# Patient Record
Sex: Female | Born: 1955 | Race: Black or African American | Hispanic: No | Marital: Married | Smoking: Former smoker
Health system: Southern US, Community
[De-identification: ages and names within clinical notes are randomized; demographics above are authoritative.]

## PROBLEM LIST (undated history)

## (undated) DIAGNOSIS — N179 Acute kidney failure, unspecified: Secondary | ICD-10-CM

## (undated) DIAGNOSIS — L309 Dermatitis, unspecified: Secondary | ICD-10-CM

## (undated) DIAGNOSIS — M5134 Other intervertebral disc degeneration, thoracic region: Secondary | ICD-10-CM

## (undated) DIAGNOSIS — K219 Gastro-esophageal reflux disease without esophagitis: Secondary | ICD-10-CM

## (undated) DIAGNOSIS — Z720 Tobacco use: Secondary | ICD-10-CM

## (undated) DIAGNOSIS — M109 Gout, unspecified: Secondary | ICD-10-CM

## (undated) DIAGNOSIS — E876 Hypokalemia: Secondary | ICD-10-CM

## (undated) DIAGNOSIS — E785 Hyperlipidemia, unspecified: Secondary | ICD-10-CM

## (undated) DIAGNOSIS — I10 Essential (primary) hypertension: Secondary | ICD-10-CM

## (undated) DIAGNOSIS — M8580 Other specified disorders of bone density and structure, unspecified site: Secondary | ICD-10-CM

## (undated) HISTORY — PX: COLONOSCOPY: SHX174

## (undated) HISTORY — PX: TUBAL LIGATION: SHX77

## (undated) HISTORY — DX: Hyperlipidemia, unspecified: E78.5

## (undated) HISTORY — DX: Gastro-esophageal reflux disease without esophagitis: K21.9

## (undated) HISTORY — DX: Tobacco use: Z72.0

## (undated) HISTORY — DX: Other intervertebral disc degeneration, thoracic region: M51.34

## (undated) HISTORY — DX: Gout, unspecified: M10.9

## (undated) HISTORY — DX: Other specified disorders of bone density and structure, unspecified site: M85.80

## (undated) HISTORY — PX: FOOT SURGERY: SHX648

## (undated) HISTORY — PX: POLYPECTOMY: SHX149

---
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
## 1999-06-13 ENCOUNTER — Encounter: Payer: Self-pay | Admitting: Emergency Medicine

## 1999-06-13 ENCOUNTER — Emergency Department (HOSPITAL_COMMUNITY): Admission: EM | Admit: 1999-06-13 | Discharge: 1999-06-13 | Payer: Self-pay

## 2000-02-03 ENCOUNTER — Ambulatory Visit (HOSPITAL_COMMUNITY): Admission: RE | Admit: 2000-02-03 | Discharge: 2000-02-03 | Payer: Self-pay | Admitting: *Deleted

## 2002-02-14 ENCOUNTER — Other Ambulatory Visit: Admission: RE | Admit: 2002-02-14 | Discharge: 2002-02-14 | Payer: Self-pay | Admitting: Gynecology

## 2003-01-29 ENCOUNTER — Other Ambulatory Visit: Admission: RE | Admit: 2003-01-29 | Discharge: 2003-01-29 | Payer: Self-pay | Admitting: Obstetrics and Gynecology

## 2004-08-24 ENCOUNTER — Other Ambulatory Visit: Admission: RE | Admit: 2004-08-24 | Discharge: 2004-08-24 | Payer: Self-pay | Admitting: Obstetrics and Gynecology

## 2004-09-16 ENCOUNTER — Encounter: Admission: RE | Admit: 2004-09-16 | Discharge: 2004-09-16 | Payer: Self-pay | Admitting: Obstetrics and Gynecology

## 2004-10-14 ENCOUNTER — Encounter (INDEPENDENT_AMBULATORY_CARE_PROVIDER_SITE_OTHER): Payer: Self-pay | Admitting: Specialist

## 2004-10-14 ENCOUNTER — Ambulatory Visit (HOSPITAL_COMMUNITY): Admission: RE | Admit: 2004-10-14 | Discharge: 2004-10-14 | Payer: Self-pay | Admitting: Obstetrics and Gynecology

## 2010-03-13 ENCOUNTER — Emergency Department (HOSPITAL_COMMUNITY): Admission: EM | Admit: 2010-03-13 | Discharge: 2010-03-13 | Payer: Self-pay | Admitting: Family Medicine

## 2010-04-17 ENCOUNTER — Encounter: Admission: RE | Admit: 2010-04-17 | Discharge: 2010-04-17 | Payer: Self-pay | Admitting: Internal Medicine

## 2010-04-21 ENCOUNTER — Ambulatory Visit (HOSPITAL_COMMUNITY): Admission: RE | Admit: 2010-04-21 | Discharge: 2010-04-21 | Payer: Self-pay | Admitting: Internal Medicine

## 2010-11-04 ENCOUNTER — Emergency Department (HOSPITAL_COMMUNITY)
Admission: EM | Admit: 2010-11-04 | Discharge: 2010-11-04 | Payer: Self-pay | Source: Home / Self Care | Admitting: Emergency Medicine

## 2011-01-11 LAB — POCT I-STAT, CHEM 8
BUN: 22 mg/dL (ref 6–23)
Calcium, Ion: 1.16 mmol/L (ref 1.12–1.32)
Chloride: 108 mEq/L (ref 96–112)
Creatinine, Ser: 1.4 mg/dL — ABNORMAL HIGH (ref 0.4–1.2)
Glucose, Bld: 96 mg/dL (ref 70–99)
HCT: 44 % (ref 36.0–46.0)
Hemoglobin: 15 g/dL (ref 12.0–15.0)
Potassium: 3.7 mEq/L (ref 3.5–5.1)
Sodium: 142 mEq/L (ref 135–145)
TCO2: 25 mmol/L (ref 0–100)

## 2011-03-12 NOTE — H&P (Signed)
Becky Lara, Becky Lara              ACCOUNT NO.:  [id number]   MEDICAL RECORD NO.:  [id number]          PATIENT TYPE:  AMB   LOCATION:  SDC                           FACILITY:  [facility]   PHYSICIAN:  Charles A. Delcambre, MDDATE OF BIRTH:  [birth year]   DATE OF ADMISSION:  DATE OF DISCHARGE:                                HISTORY & PHYSICAL   HISTORY OF PRESENT ILLNESS:  This patient is to be admitted October 14, 2004 for hysteroscopy and D&C for a large endometrial polyp with  menorrhagia.  She is a 55 year old para 2-0-2-2, LMP September 15, 2004.  She  underwent sonohistogram showing a large lower uterine segment, almost down  to the cervix, endometrial/endocervical polyp.  This measured 4 x 4 x 1.7  mm, solid, with cystic areas suggestive of a polyp.   PAST MEDICAL HISTORY:  Hypertension.   PAST SURGICAL HISTORY:  1.  SVD x2.  2.  Tubal ligation.   MEDICATIONS:  Antihypertensive medication.  She is not sure what, but she  takes it once a day in the morning.   PRIMARY CARE PHYSICIAN:  Georgann Housekeeper, M.D.   ALLERGIES:  NO KNOWN DRUG ALLERGIES.   SOCIAL HISTORY:  No tobacco, ethanol, or drug use.  STD exposure in the  past.  The patient not currently married, not sexually active currently.   FAMILY HISTORY:  Father deceased at age 78 with heart disease.  Mother  deceased at 40 with hypertension, diabetes.  No siblings.  Otherwise  negative family history.   REVIEW OF SYSTEMS:  Irregular menstrual cycles.  No fever or chills.  No  rashes, lesions.  Seasonal allergies.  No chest pain, shortness of breath,  or wheezing.  No diarrhea, constipation, bleeding, melena, hematochezia,  urgency, frequency, dysuria, incontinence, hematuria, galactorrhea,  emotional changes.   PHYSICAL EXAMINATION:  GENERAL:  Alert and oriented x3.  No acute distress.  VITAL SIGNS:  Blood pressure 128/70, respirations 20, heart rate 80, weight  224 pounds.  HEENT:  Grossly within normal limits.  NECK:   Supple without thyromegaly or adenopathy.  LUNGS:  Clear bilaterally.  BACK:  No CVAT.  CORONARY:  Regular rate and rhythm.  BREASTS:  Symmetrical.  Otherwise, deferred.  ABDOMEN:  Soft, nontender.  No hepatosplenomegaly or other masses noted.  PELVIC:  Normal external female genitalia.  Bartholin, Skene's, and urethra  within normal limits.  No discharge or lesions.  Uterus not enlarged  grossly.  Adnexa nontender and without masses bilaterally.  Ovaries  difficult to palpate secondary to body habitus.  Endometrial biopsy was  benign at that time.  Sound was to 10 cm.  EXTREMITIES:  Nontender.  No edema.  RECTAL:  Not done.  Anterior and superior __________ appeared normal.   ASSESSMENT:  Menorrhagia.  Endometrial polyp.   PLAN:  Hysteroscopy, D&C, with polypectomy.  Preoperative CBC, CHEM 20,  urine pregnancy test, and _________ 0800.  Plan surgery at 1400.  All  questions were answered.  Risks of infection, bleeding, uterine perforation,  blood product risks including hepatitis and HIV exposure; bowel and bladder  damage.  We  will proceed as outlined.      CAD/MEDQ  D:  10/09/2004  T:  10/09/2004  Job:  [id number]

## 2011-03-12 NOTE — Op Note (Signed)
NAMENANSI, BIRMINGHAM              ACCOUNT NO.:  [id number]   MEDICAL RECORD NO.:  [id number]          PATIENT TYPE:  AMB   LOCATION:  SDC                           FACILITY:  [facility]   PHYSICIAN:  Charles A. Delcambre, MDDATE OF BIRTH:  1956-07-12   DATE OF PROCEDURE:  10/14/2004  DATE OF DISCHARGE:                                 OPERATIVE REPORT   PREOPERATIVE DIAGNOSES:  1.  Endometrial polyp.  2.  Menorrhagia.   POSTOPERATIVE DIAGNOSES:  1.  Endometrial polyp.  2.  Menorrhagia.   PROCEDURES:  1.  Dilation and curettage.  2.  Polypectomy.   SURGEON:  Charles A. Delcambre, MD   ASSISTANT:  None.   COMPLICATIONS:  None.   ESTIMATED BLOOD LOSS:  Less than 50 mL.   OPERATIVE FINDINGS:  A very large lower uterine segment polyp, approximately  2 x 4 cm, soft and fleshy.   SPECIMENS:  1.  Polyp.  2.  Endometrial curettings.   Sponge and needle count correct x2.   SORBITOL LOSS:  Less than 50 mL.  See OR record for specifics.   ANESTHESIA:  General laryngeal and IV, mask.   DESCRIPTION OF PROCEDURE:  The patient was taken to the operating room and  placed in the supine position.  General anesthetic was induced without  difficulty.  The patient was then placed in the dorsal lithotomy position in  the universal stirrups.  Sterile prep and drape was undertaken.  A single-  tooth tenaculum was placed on the anterior lip of the cervix.  Hegar  dilators were used to dilate, the sound was to 10 cm.  Dilation was done  enough to pass polyp forceps.  The large polyp structure was grasped with  the ring forceps in the lower uterine segment, twisted  free.  Hemostasis was adequate.  The polyp bed was curetted with banjo  curette, the uterus cavity was curetted with the banjo curette as well.  A  moderate amount of tissue was obtained.  Hemostasis was adequate.  All  instruments were removed and the patient was taken to recovery with the  physician in attendance.      CAD/MEDQ   D:  10/14/2004  T:  10/14/2004  Job:  [id number]

## 2011-05-11 ENCOUNTER — Other Ambulatory Visit: Payer: Self-pay | Admitting: Internal Medicine

## 2011-05-11 DIAGNOSIS — Z1231 Encounter for screening mammogram for malignant neoplasm of breast: Secondary | ICD-10-CM

## 2011-05-17 ENCOUNTER — Ambulatory Visit
Admission: RE | Admit: 2011-05-17 | Discharge: 2011-05-17 | Disposition: A | Discharge status: Home / Self Care | Payer: BC Managed Care – PPO | Source: Ambulatory Visit | Attending: Internal Medicine | Admitting: Internal Medicine

## 2011-05-17 DIAGNOSIS — Z1231 Encounter for screening mammogram for malignant neoplasm of breast: Secondary | ICD-10-CM

## 2012-01-20 ENCOUNTER — Other Ambulatory Visit: Payer: Self-pay | Admitting: Obstetrics

## 2012-01-20 DIAGNOSIS — Z780 Asymptomatic menopausal state: Secondary | ICD-10-CM

## 2012-04-24 ENCOUNTER — Ambulatory Visit (HOSPITAL_COMMUNITY): Payer: BC Managed Care – PPO

## 2012-04-26 ENCOUNTER — Ambulatory Visit (HOSPITAL_COMMUNITY)
Admission: RE | Admit: 2012-04-26 | Discharge: 2012-04-26 | Disposition: A | Discharge status: Home / Self Care | Payer: BC Managed Care – PPO | Source: Ambulatory Visit | Attending: Obstetrics | Admitting: Obstetrics

## 2012-04-26 DIAGNOSIS — Z780 Asymptomatic menopausal state: Secondary | ICD-10-CM | POA: Insufficient documentation

## 2012-04-26 DIAGNOSIS — Z13820 Encounter for screening for osteoporosis: Secondary | ICD-10-CM | POA: Insufficient documentation

## 2012-05-17 ENCOUNTER — Emergency Department (HOSPITAL_COMMUNITY)
Admission: EM | Admit: 2012-05-17 | Discharge: 2012-05-17 | Disposition: A | Discharge status: Home / Self Care | Payer: BC Managed Care – PPO | Source: Home / Self Care | Attending: Emergency Medicine | Admitting: Emergency Medicine

## 2012-05-17 ENCOUNTER — Encounter (HOSPITAL_COMMUNITY): Payer: Self-pay | Admitting: Emergency Medicine

## 2012-05-17 DIAGNOSIS — H6120 Impacted cerumen, unspecified ear: Secondary | ICD-10-CM

## 2012-05-17 HISTORY — DX: Essential (primary) hypertension: I10

## 2012-05-17 MED ORDER — NEOMYCIN-POLYMYXIN-HC 3.5-10000-1 OT SUSP: 4.0000 [drp] | Freq: Three times a day (TID) | OTIC | Status: AC

## 2012-05-17 NOTE — ED Notes (Signed)
HERE WITH R EAR PAIN THAT STARTED X4 DYS AGO.SORE TO TOUCH AND IRRITATING.TRIED HOME BABY OIL DROPS??DENIES DRAINAGE OR DIFF HEARING

## 2012-05-17 NOTE — ED Provider Notes (Signed)
History     CSN: [id number]  Arrival date & time 05/17/12  1152   First MD Initiated Contact with Patient 05/17/12 1240      Chief Complaint  Patient presents with  . Otalgia    (Consider location/radiation/quality/duration/timing/severity/associated sxs/prior treatment) HPI Comments: For 4 days she has been complaining that her right ear hurts more so at touch. She is been putting some baby all drops in her ear canal with no improvement. She wears earplugs frequently at her job, she denies any fevers, congestion or ear drainage.  The history is provided by the patient.    Past Medical History  Diagnosis Date  . Hypertension     History reviewed. No pertinent past surgical history.  No family history on file.  History  Substance Use Topics  . Smoking status: Current Everyday Smoker -- 1.0 packs/day    Types: Cigarettes  . Smokeless tobacco: Not on file  . Alcohol Use: Yes    OB History    Grav Para Term Preterm Abortions TAB SAB Ect Mult Living                  Review of Systems  Constitutional: Negative for activity change.  HENT: Positive for ear pain and ear discharge. Negative for hearing loss, nosebleeds, congestion, facial swelling, rhinorrhea, neck pain, neck stiffness, postnasal drip and tinnitus.   Eyes: Negative for photophobia, pain, redness, itching and visual disturbance.    Allergies  Review of patient's allergies indicates no known allergies.  Home Medications   Current Outpatient Rx  Name Route Sig Dispense Refill  . NEOMYCIN-POLYMYXIN-HC 3.5-10000-1 OT SUSP Right Ear Place 4 drops into the right ear 3 (three) times daily. 10 mL 0    BP 123/78  Pulse 80  Temp 98.3 F (36.8 C) (Oral)  Resp 16  SpO2 95%  LMP 04/26/2008  Physical Exam  Nursing note and vitals reviewed. Constitutional: She appears well-developed and well-nourished.  HENT:  Right Ear: Tympanic membrane normal. No lacerations. There is tenderness. No drainage or  swelling. No foreign bodies. No mastoid tenderness. Tympanic membrane is not injected, not retracted and not bulging. Tympanic membrane mobility is normal. No middle ear effusion. No decreased hearing is noted.  Eyes: Conjunctivae are normal.  Neck: Normal range of motion. Neck supple. No JVD present.  Musculoskeletal: She exhibits no edema and no tenderness.  Lymphadenopathy:    She has no cervical adenopathy.  Neurological: She is alert.  Skin: No rash noted. No erythema.    ED Course  Procedures (including critical care time)  Labs Reviewed - No data to display No results found.   1. Cerumen impaction       MDM  Right ear cerumen impaction. With an associated early ear canal infection. Patient was prescribed a topical ear antibiotic drops for 5 days. Was advised to return if no improvement after 4 days after ear irrigation was able to reexamine patient and observe and unremarkable tympanic membrane.        Jimmie Molly, MD 05/17/12 [phone]

## 2012-05-22 ENCOUNTER — Other Ambulatory Visit: Payer: Self-pay | Admitting: Internal Medicine

## 2012-05-22 DIAGNOSIS — Z1231 Encounter for screening mammogram for malignant neoplasm of breast: Secondary | ICD-10-CM

## 2012-06-02 ENCOUNTER — Ambulatory Visit
Admission: RE | Admit: 2012-06-02 | Discharge: 2012-06-02 | Disposition: A | Discharge status: Home / Self Care | Payer: BC Managed Care – PPO | Source: Ambulatory Visit | Attending: Internal Medicine | Admitting: Internal Medicine

## 2012-06-02 DIAGNOSIS — Z1231 Encounter for screening mammogram for malignant neoplasm of breast: Secondary | ICD-10-CM

## 2013-08-10 ENCOUNTER — Other Ambulatory Visit: Payer: Self-pay

## 2013-08-10 DIAGNOSIS — Z1231 Encounter for screening mammogram for malignant neoplasm of breast: Secondary | ICD-10-CM

## 2013-09-10 ENCOUNTER — Ambulatory Visit: Payer: BC Managed Care – PPO

## 2013-09-10 ENCOUNTER — Ambulatory Visit
Admission: RE | Admit: 2013-09-10 | Discharge: 2013-09-10 | Disposition: A | Discharge status: Home / Self Care | Payer: BC Managed Care – PPO | Source: Ambulatory Visit

## 2013-09-10 DIAGNOSIS — Z1231 Encounter for screening mammogram for malignant neoplasm of breast: Secondary | ICD-10-CM

## 2014-10-31 ENCOUNTER — Other Ambulatory Visit: Payer: Self-pay

## 2014-10-31 DIAGNOSIS — Z1231 Encounter for screening mammogram for malignant neoplasm of breast: Secondary | ICD-10-CM

## 2014-11-08 ENCOUNTER — Ambulatory Visit
Admission: RE | Admit: 2014-11-08 | Discharge: 2014-11-08 | Disposition: A | Discharge status: Home / Self Care | Payer: BLUE CROSS/BLUE SHIELD | Source: Ambulatory Visit

## 2014-11-08 DIAGNOSIS — Z1231 Encounter for screening mammogram for malignant neoplasm of breast: Secondary | ICD-10-CM

## 2014-12-18 ENCOUNTER — Encounter (HOSPITAL_COMMUNITY): Payer: Self-pay | Admitting: Emergency Medicine

## 2014-12-18 ENCOUNTER — Observation Stay (HOSPITAL_COMMUNITY)
Admission: EM | Admit: 2014-12-18 | Discharge: 2014-12-20 | Disposition: A | Discharge status: Home / Self Care | Payer: BLUE CROSS/BLUE SHIELD | Attending: Internal Medicine | Admitting: Internal Medicine

## 2014-12-18 DIAGNOSIS — N179 Acute kidney failure, unspecified: Secondary | ICD-10-CM

## 2014-12-18 DIAGNOSIS — R079 Chest pain, unspecified: Secondary | ICD-10-CM | POA: Diagnosis not present

## 2014-12-18 DIAGNOSIS — I10 Essential (primary) hypertension: Secondary | ICD-10-CM | POA: Diagnosis not present

## 2014-12-18 DIAGNOSIS — F17210 Nicotine dependence, cigarettes, uncomplicated: Secondary | ICD-10-CM | POA: Diagnosis not present

## 2014-12-18 DIAGNOSIS — E876 Hypokalemia: Secondary | ICD-10-CM | POA: Insufficient documentation

## 2014-12-18 DIAGNOSIS — Z7982 Long term (current) use of aspirin: Secondary | ICD-10-CM | POA: Insufficient documentation

## 2014-12-18 DIAGNOSIS — Z720 Tobacco use: Secondary | ICD-10-CM | POA: Diagnosis present

## 2014-12-18 DIAGNOSIS — K219 Gastro-esophageal reflux disease without esophagitis: Secondary | ICD-10-CM | POA: Insufficient documentation

## 2014-12-18 DIAGNOSIS — D72829 Elevated white blood cell count, unspecified: Secondary | ICD-10-CM | POA: Insufficient documentation

## 2014-12-18 DIAGNOSIS — H538 Other visual disturbances: Secondary | ICD-10-CM | POA: Diagnosis present

## 2014-12-18 LAB — CBC WITH DIFFERENTIAL/PLATELET
BASOS ABS: 0 10*3/uL (ref 0.0–0.1)
BASOS PCT: 0 % (ref 0–1)
Eosinophils Absolute: 0.3 10*3/uL (ref 0.0–0.7)
Eosinophils Relative: 3 % (ref 0–5)
HCT: 42.4 % (ref 36.0–46.0)
Hemoglobin: 14.5 g/dL (ref 12.0–15.0)
LYMPHS PCT: 17 % (ref 12–46)
Lymphs Abs: 2.2 10*3/uL (ref 0.7–4.0)
MCH: 28.7 pg (ref 26.0–34.0)
MCHC: 34.2 g/dL (ref 30.0–36.0)
MCV: 84 fL (ref 78.0–100.0)
Monocytes Absolute: 0.9 10*3/uL (ref 0.1–1.0)
Monocytes Relative: 7 % (ref 3–12)
NEUTROS ABS: 9.3 10*3/uL — AB (ref 1.7–7.7)
NEUTROS PCT: 73 % (ref 43–77)
Platelets: 192 10*3/uL (ref 150–400)
RBC: 5.05 MIL/uL (ref 3.87–5.11)
RDW: 13.5 % (ref 11.5–15.5)
WBC: 12.7 10*3/uL — AB (ref 4.0–10.5)

## 2014-12-18 LAB — URINALYSIS, ROUTINE W REFLEX MICROSCOPIC
BILIRUBIN URINE: NEGATIVE
Glucose, UA: NEGATIVE mg/dL
HGB URINE DIPSTICK: NEGATIVE
Ketones, ur: NEGATIVE mg/dL
Nitrite: NEGATIVE
Protein, ur: NEGATIVE mg/dL
Specific Gravity, Urine: 1.013 (ref 1.005–1.030)
Urobilinogen, UA: 0.2 mg/dL (ref 0.0–1.0)
pH: 5.5 (ref 5.0–8.0)

## 2014-12-18 LAB — URINE MICROSCOPIC-ADD ON

## 2014-12-18 LAB — PROTIME-INR
INR: 0.96 (ref 0.00–1.49)
PROTHROMBIN TIME: 12.9 s (ref 11.6–15.2)

## 2014-12-18 MED ORDER — HYDROCODONE-ACETAMINOPHEN 5-325 MG PO TABS: 1.0000 | ORAL_TABLET | Freq: Once | ORAL | Status: DC

## 2014-12-18 MED ORDER — SODIUM CHLORIDE 0.9 % IV BOLUS (SEPSIS)
1000.0000 mL | Freq: Once | INTRAVENOUS | Status: AC
  Administered 2014-12-19: 1000 mL via INTRAVENOUS

## 2014-12-18 NOTE — ED Provider Notes (Signed)
CSN: [id number]     Arrival date & time 12/18/14  2202 History   First MD Initiated Contact with Patient 12/18/14 2204     Chief Complaint  Patient presents with  . Chest Pain     (Consider location/radiation/quality/duration/timing/severity/associated sxs/prior Treatment) The history is provided by the patient. No language interpreter was used.  Becky Lara is a 59 y/o F with PMHx of HTN presenting to the ED with sudden onset of chest pain that started this evening at approximately 9:00PM while the patient was at work. Patient reported that she had sudden onset of left sided chest pain, underneath her left breast that was a sharp pain without radiation. Reported that she was sweaty and clammy during this time and felt as if she was about to faint. Stated that she chewed 4 baby aspirins. Stated that EMS was called. Reported that prior to EMS coming she had another episode of chest pain that was sharp, was given one SL nitro while on route to the ED - stated that this has relieved her pain. Reported that since the event she started to have a mild headache. Reported cloudy vision, as if there is a hazy film over her eyes bilaterally. Denied shortness of breath, difficulty breathing, fainting, nausea, vomiting, diarrhea, is complete loss of vision, numbness, tingling, weakness, difficulty with speech, difficulty swallowing, neck pain, neck stiffness, jaw pain. PCP [facility]  Past Medical History  Diagnosis Date  . Hypertension    History reviewed. No pertinent past surgical history. No family history on file. History  Substance Use Topics  . Smoking status: Current Some Day Smoker -- 0.05 packs/day    Types: Cigarettes  . Smokeless tobacco: Not on file  . Alcohol Use: Yes     Comment: occasional   OB History    No data available     Review of Systems  Constitutional: Positive for diaphoresis. Negative for fever and chills.  Eyes: Positive for visual disturbance.   Respiratory: Negative for cough, chest tightness and shortness of breath.   Cardiovascular: Positive for chest pain.  Gastrointestinal: Negative for nausea and vomiting.  Musculoskeletal: Negative for back pain and neck pain.  Neurological: Negative for dizziness, weakness, numbness and headaches.      Allergies  Review of patient's allergies indicates no known allergies.  Home Medications   Prior to Admission medications   Not on File   BP 133/74 mmHg  Pulse 85  Temp(Src) 98.8 F (37.1 C) (Oral)  Resp 19  SpO2 99%  LMP 04/26/2008 Physical Exam  Constitutional: She is oriented to person, place, and time. She appears well-developed and well-nourished. No distress.  HENT:  Head: Normocephalic and atraumatic.  Mouth/Throat: Oropharynx is clear and moist. No oropharyngeal exudate.  Eyes: Conjunctivae and EOM are normal. Pupils are equal, round, and reactive to light. Right eye exhibits no discharge. Left eye exhibits no discharge.  Neck: Normal range of motion. Neck supple. No tracheal deviation present.  Cardiovascular: Normal rate, regular rhythm and normal heart sounds.   Pulses:      Radial pulses are 2+ on the right side, and 2+ on the left side.       Dorsalis pedis pulses are 2+ on the right side, and 2+ on the left side.  Pulmonary/Chest: Effort normal and breath sounds normal. No respiratory distress. She has no wheezes. She has no rales. She exhibits no tenderness.  Patient is able to speak in full sentences without difficulty  Negative use of  accessory muscles Negative stridor  Musculoskeletal: Normal range of motion.  Full ROM to upper and lower extremities without difficulty noted, negative ataxia noted.  Lymphadenopathy:    She has no cervical adenopathy.  Neurological: She is alert and oriented to person, place, and time. No cranial nerve deficit. She exhibits normal muscle tone. Coordination normal. GCS eye subscore is 4. GCS verbal subscore is 5. GCS motor  subscore is 6.  Cranial nerves grossly intact Strength 5+/5+ to upper and lower extremities bilaterally with resistance applied, equal distribution noted Equal grip strength  Sensation intact  Negative facial droop Negative slurred speech  Negative aphasia Negative arm drift Fine motor skills intact Patient follows commands well  Patient responds to questions appropriately   Skin: Skin is warm and dry. No rash noted. She is not diaphoretic. No erythema.  Psychiatric: She has a normal mood and affect. Her behavior is normal. Thought content normal.  Nursing note and vitals reviewed.   ED Course  Procedures (including critical care time)  Results for orders placed or performed during the hospital encounter of 12/18/14  Troponin I  Result Value Ref Range   Troponin I <0.03 <0.031 ng/mL  CBC with Differential/Platelet  Result Value Ref Range   WBC 12.7 (H) 4.0 - 10.5 K/uL   RBC 5.05 3.87 - 5.11 MIL/uL   Hemoglobin 14.5 12.0 - 15.0 g/dL   HCT 42.4 36.0 - 46.0 %   MCV 84.0 78.0 - 100.0 fL   MCH 28.7 26.0 - 34.0 pg   MCHC 34.2 30.0 - 36.0 g/dL   RDW 13.5 11.5 - 15.5 %   Platelets 192 150 - 400 K/uL   Neutrophils Relative % 73 43 - 77 %   Neutro Abs 9.3 (H) 1.7 - 7.7 K/uL   Lymphocytes Relative 17 12 - 46 %   Lymphs Abs 2.2 0.7 - 4.0 K/uL   Monocytes Relative 7 3 - 12 %   Monocytes Absolute 0.9 0.1 - 1.0 K/uL   Eosinophils Relative 3 0 - 5 %   Eosinophils Absolute 0.3 0.0 - 0.7 K/uL   Basophils Relative 0 0 - 1 %   Basophils Absolute 0.0 0.0 - 0.1 K/uL  Comprehensive metabolic panel  Result Value Ref Range   Sodium 138 135 - 145 mmol/L   Potassium 2.6 (LL) 3.5 - 5.1 mmol/L   Chloride 99 96 - 112 mmol/L   CO2 28 19 - 32 mmol/L   Glucose, Bld 156 (H) 70 - 99 mg/dL   BUN 13 6 - 23 mg/dL   Creatinine, Ser 1.54 (H) 0.50 - 1.10 mg/dL   Calcium 9.8 8.4 - 10.5 mg/dL   Total Protein 8.4 (H) 6.0 - 8.3 g/dL   Albumin 4.0 3.5 - 5.2 g/dL   AST 21 0 - 37 U/L   ALT 17 0 - 35 U/L    Alkaline Phosphatase 87 39 - 117 U/L   Total Bilirubin 1.1 0.3 - 1.2 mg/dL   GFR calc non Af Amer 36 (L) >90 mL/min   GFR calc Af Amer 41 (L) >90 mL/min   Anion gap 11 5 - 15  Urinalysis, Routine w reflex microscopic  Result Value Ref Range   Color, Urine YELLOW YELLOW   APPearance CLEAR CLEAR   Specific Gravity, Urine 1.013 1.005 - 1.030   pH 5.5 5.0 - 8.0   Glucose, UA NEGATIVE NEGATIVE mg/dL   Hgb urine dipstick NEGATIVE NEGATIVE   Bilirubin Urine NEGATIVE NEGATIVE   Ketones, ur NEGATIVE NEGATIVE  mg/dL   Protein, ur NEGATIVE NEGATIVE mg/dL   Urobilinogen, UA 0.2 0.0 - 1.0 mg/dL   Nitrite NEGATIVE NEGATIVE   Leukocytes, UA TRACE (A) NEGATIVE  Brain natriuretic peptide  Result Value Ref Range   B Natriuretic Peptide 217.7 (H) 0.0 - 100.0 pg/mL  Protime-INR  Result Value Ref Range   Prothrombin Time 12.9 11.6 - 15.2 seconds   INR 0.96 0.00 - 1.49  Ethanol  Result Value Ref Range   Alcohol, Ethyl (B) <5 0 - 9 mg/dL  Urine microscopic-add on  Result Value Ref Range   Squamous Epithelial / LPF RARE RARE   WBC, UA 3-6 <3 WBC/hpf   RBC / HPF 0-2 <3 RBC/hpf   Bacteria, UA RARE RARE   Casts HYALINE CASTS (A) NEGATIVE    Labs Review Labs Reviewed  CBC WITH DIFFERENTIAL/PLATELET - Abnormal; Notable for the following:    WBC 12.7 (*)    Neutro Abs 9.3 (*)    All other components within normal limits  COMPREHENSIVE METABOLIC PANEL - Abnormal; Notable for the following:    Potassium 2.6 (*)    Glucose, Bld 156 (*)    Creatinine, Ser 1.54 (*)    Total Protein 8.4 (*)    GFR calc non Af Amer 36 (*)    GFR calc Af Amer 41 (*)    All other components within normal limits  URINALYSIS, ROUTINE W REFLEX MICROSCOPIC - Abnormal; Notable for the following:    Leukocytes, UA TRACE (*)    All other components within normal limits  BRAIN NATRIURETIC PEPTIDE - Abnormal; Notable for the following:    B Natriuretic Peptide 217.7 (*)    All other components within normal limits  URINE  MICROSCOPIC-ADD ON - Abnormal; Notable for the following:    Casts HYALINE CASTS (*)    All other components within normal limits  TROPONIN I  PROTIME-INR  ETHANOL  URINE RAPID DRUG SCREEN (HOSP PERFORMED)  MAGNESIUM    Imaging Review Dg Chest 2 View  12/19/2014   CLINICAL DATA:  Sharp left-sided chest pain.  EXAM: CHEST  2 VIEW  COMPARISON:  04/21/2010  FINDINGS: Shallow inspiration with elevation of the right hemidiaphragm. Linear atelectasis in both lung bases, greater on the right. Normal heart size and pulmonary vascularity. No focal consolidation in the lungs. No blunting of costophrenic angles. No pneumothorax. Degenerative changes in the spine.  IMPRESSION: Shallow inspiration with elevation of right hemidiaphragm. Atelectasis in the lung bases, greater on the right.   Electronically Signed   By: Lucienne Capers M.D.   On: 12/19/2014 00:29   Ct Head Wo Contrast  12/19/2014   CLINICAL DATA:  Bilateral blurry vision and headaches for 1 month. Hypertension.  EXAM: CT HEAD WITHOUT CONTRAST  TECHNIQUE: Contiguous axial images were obtained from the base of the skull through the vertex without intravenous contrast.  COMPARISON:  None.  FINDINGS: Ventricles and sulci appear symmetrical. Low-attenuation changes throughout the deep white matter likely due to small vessel ischemia although nonspecific and other demyelinating or dysmyelinating processes could also have this appearance. No mass effect or midline shift. No abnormal extra-axial fluid collections. Gray-white matter junctions are distinct. Basal cisterns are not effaced. No evidence of acute intracranial hemorrhage. No depressed skull fractures. Visualized paranasal sinuses and mastoid air cells are not opacified.  IMPRESSION: Nonspecific diffuse white matter disease. No acute intracranial hemorrhage or mass effect.   Electronically Signed   By: Lucienne Capers M.D.   On: 12/19/2014 00:28  EKG Interpretation   Date/Time:  Wednesday  December 18 2014 22:15:45 EST Ventricular Rate:  93 PR Interval:  212 QRS Duration: 80 QT Interval:  366 QTC Calculation: 455 R Axis:   13 Text Interpretation:  Age not entered, assumed to be  59 years old for  purpose of ECG interpretation Sinus rhythm Prolonged PR interval  Borderline T wave abnormalities Baseline wander in lead(s) V3 Confirmed by  ZAMMIT  MD, JOSEPH [phone]) on 12/18/2014 10:18:54 PM         Visual Acuity  Right Eye Distance:   Left Eye Distance:   Bilateral Distance:    Right Eye Near: R Near: 20/200 Left Eye Near:  L Near: 20/200 Bilateral Near:  20/200  12:56 AM This provider spoke with Dr. Blaine Hamper, [facility] - discussed case, labs, imaging, vitals, ED course in great detail. Patient to be admitted to telemetry for chest pain and hypokalemia. Recommended potassium PO to be administered.   MDM   Final diagnoses:  Chest pain, unspecified chest pain type  Hypokalemia    Medications  sodium chloride 0.9 % bolus 1,000 mL (1,000 mLs Intravenous New Bag/Given 12/19/14 0022)  potassium chloride SA (K-DUR,KLOR-CON) CR tablet 40 mEq (40 mEq Oral Given 12/19/14 0059)    Filed Vitals:   12/18/14 2215 12/18/14 2315 12/18/14 2345 12/19/14 0045  BP: 121/74 134/74 130/68 133/74  Pulse: 95 88 82 85  Temp:      TempSrc:      Resp: 12  13 19   SpO2: 97% 100% 98% 99%   EKG noted sinus rhythm with prolonged PR interval with a heart rate of 93 bpm. Troponin negative elevation. CBC noted mildly elevated white blood cell count of 12.7. Hemoglobin 14.5, hematocrit 42.5. CMP noted hypokalemia with a potassium level of 2.6. Creatinine elevated at 1.54 - nothing to compare within the past 4 years. Magnesium 2.2. BNP elevated at 217.7. Ethanol negative elevation. Urinalysis noted-negative nitrites, trace of leukocytes-white blood cell count 3-6. Urine drug screen positive for cannabis. CT head without contrast nonspecific diffuse white matter disease, no acute intracranial  hemorrhage or mass effect identified. Chest x-ray noted shallow inspiration with elevation of right hemidiaphragm atelectasis in the lung bases, greater on the right.  Incidental finding of hypokalemia with potassium level of 2.6 - magnesium pending. Patient started on potassium by mouth. Reported haziness to eyes bilaterally - negative focal neurological deficits, visual acuity unremarkable and CT head negative. Patient presenting to the ED with sudden onset of chest pain localized left-sided chest. Patient to be admitted to the hospital for cardiac rule out. Discussed case in great detail [facility], patient to be admitted to telemetry floor regarding hypokalemia and chest pain. Discussed plan for admission with patient in great detail. Patient agreed to plan of admission. Patient stable for transfer.  Jamse Mead, PA-C 12/19/14 0101  Jamse Mead, PA-C 12/19/14 2902  Maudry Diego, MD 12/20/14 [phone]

## 2014-12-18 NOTE — ED Notes (Signed)
PA at the bedside.

## 2014-12-18 NOTE — ED Notes (Addendum)
Per EMS, pt coming from work today for left lower CP. Pt began having this CP at 2115 tonight. Pt said that she was at work and felt like she was going to pass out and became dizzy and diaphoretic. Pt alert x4. Pt reports blurred vision at this time. Pt has no focal weakness Pt given 324 of aspirin in route.

## 2014-12-19 ENCOUNTER — Emergency Department (HOSPITAL_COMMUNITY): Payer: BLUE CROSS/BLUE SHIELD

## 2014-12-19 ENCOUNTER — Encounter (HOSPITAL_COMMUNITY): Payer: Self-pay

## 2014-12-19 ENCOUNTER — Inpatient Hospital Stay (HOSPITAL_COMMUNITY): Payer: BLUE CROSS/BLUE SHIELD

## 2014-12-19 DIAGNOSIS — R079 Chest pain, unspecified: Principal | ICD-10-CM | POA: Insufficient documentation

## 2014-12-19 DIAGNOSIS — N179 Acute kidney failure, unspecified: Secondary | ICD-10-CM

## 2014-12-19 DIAGNOSIS — E876 Hypokalemia: Secondary | ICD-10-CM

## 2014-12-19 DIAGNOSIS — Z720 Tobacco use: Secondary | ICD-10-CM | POA: Diagnosis present

## 2014-12-19 DIAGNOSIS — I10 Essential (primary) hypertension: Secondary | ICD-10-CM

## 2014-12-19 DIAGNOSIS — H538 Other visual disturbances: Secondary | ICD-10-CM

## 2014-12-19 HISTORY — DX: Acute kidney failure, unspecified: N17.9

## 2014-12-19 HISTORY — DX: Hypokalemia: E87.6

## 2014-12-19 HISTORY — DX: Essential (primary) hypertension: I10

## 2014-12-19 LAB — COMPREHENSIVE METABOLIC PANEL
ALBUMIN: 4 g/dL (ref 3.5–5.2)
ALT: 17 U/L (ref 0–35)
ANION GAP: 11 (ref 5–15)
AST: 21 U/L (ref 0–37)
Alkaline Phosphatase: 87 U/L (ref 39–117)
BUN: 13 mg/dL (ref 6–23)
CALCIUM: 9.8 mg/dL (ref 8.4–10.5)
CHLORIDE: 99 mmol/L (ref 96–112)
CO2: 28 mmol/L (ref 19–32)
CREATININE: 1.54 mg/dL — AB (ref 0.50–1.10)
GFR calc Af Amer: 41 mL/min — ABNORMAL LOW (ref >90)
GFR, EST NON AFRICAN AMERICAN: 36 mL/min — AB (ref >90)
Glucose, Bld: 156 mg/dL — ABNORMAL HIGH (ref 70–99)
Potassium: 2.6 mmol/L — CL (ref 3.5–5.1)
Sodium: 138 mmol/L (ref 135–145)
Total Bilirubin: 1.1 mg/dL (ref 0.3–1.2)
Total Protein: 8.4 g/dL — ABNORMAL HIGH (ref 6.0–8.3)

## 2014-12-19 LAB — LIPID PANEL
Cholesterol: 169 mg/dL (ref 0–200)
HDL: 65 mg/dL (ref >39)
LDL Cholesterol: 95 mg/dL (ref 0–99)
Total CHOL/HDL Ratio: 2.6 RATIO
Triglycerides: 45 mg/dL (ref <150)
VLDL: 9 mg/dL (ref 0–40)

## 2014-12-19 LAB — CBC
HEMATOCRIT: 39.1 % (ref 36.0–46.0)
HEMOGLOBIN: 13.5 g/dL (ref 12.0–15.0)
MCH: 29 pg (ref 26.0–34.0)
MCHC: 34.5 g/dL (ref 30.0–36.0)
MCV: 84.1 fL (ref 78.0–100.0)
Platelets: 179 10*3/uL (ref 150–400)
RBC: 4.65 MIL/uL (ref 3.87–5.11)
RDW: 13.6 % (ref 11.5–15.5)
WBC: 10.4 10*3/uL (ref 4.0–10.5)

## 2014-12-19 LAB — RAPID URINE DRUG SCREEN, HOSP PERFORMED
Amphetamines: NOT DETECTED
BARBITURATES: NOT DETECTED
Benzodiazepines: NOT DETECTED
Cocaine: NOT DETECTED
OPIATES: NOT DETECTED
Tetrahydrocannabinol: POSITIVE — AB

## 2014-12-19 LAB — BASIC METABOLIC PANEL
Anion gap: 11 (ref 5–15)
BUN: 11 mg/dL (ref 6–23)
CO2: 25 mmol/L (ref 19–32)
Calcium: 9.1 mg/dL (ref 8.4–10.5)
Chloride: 105 mmol/L (ref 96–112)
Creatinine, Ser: 1.25 mg/dL — ABNORMAL HIGH (ref 0.50–1.10)
GFR calc Af Amer: 53 mL/min — ABNORMAL LOW (ref >90)
GFR, EST NON AFRICAN AMERICAN: 46 mL/min — AB (ref >90)
GLUCOSE: 105 mg/dL — AB (ref 70–99)
POTASSIUM: 3.1 mmol/L — AB (ref 3.5–5.1)
Sodium: 141 mmol/L (ref 135–145)

## 2014-12-19 LAB — TROPONIN I
Troponin I: <0.03 ng/mL (ref <0.031)
Troponin I: <0.03 ng/mL (ref <0.031)
Troponin I: <0.03 ng/mL (ref <0.031)
Troponin I: <0.03 ng/mL (ref <0.031)

## 2014-12-19 LAB — ETHANOL: Alcohol, Ethyl (B): <5 mg/dL (ref 0–9)

## 2014-12-19 LAB — SODIUM, URINE, RANDOM: Sodium, Ur: 87 mmol/L

## 2014-12-19 LAB — BRAIN NATRIURETIC PEPTIDE: B NATRIURETIC PEPTIDE 5: 217.7 pg/mL — AB (ref 0.0–100.0)

## 2014-12-19 LAB — MAGNESIUM: Magnesium: 2.2 mg/dL (ref 1.5–2.5)

## 2014-12-19 LAB — GLUCOSE, CAPILLARY
GLUCOSE-CAPILLARY: 102 mg/dL — AB (ref 70–99)
Glucose-Capillary: 113 mg/dL — ABNORMAL HIGH (ref 70–99)

## 2014-12-19 LAB — CREATININE, URINE, RANDOM: Creatinine, Urine: 86.6 mg/dL

## 2014-12-19 MED ORDER — MORPHINE SULFATE 2 MG/ML IJ SOLN: 2.0000 mg | INTRAMUSCULAR | Status: DC | PRN

## 2014-12-19 MED ORDER — SODIUM CHLORIDE 0.9 % IJ SOLN
3.0000 mL | Freq: Two times a day (BID) | INTRAMUSCULAR | Status: DC
  Administered 2014-12-20: 3 mL via INTRAVENOUS

## 2014-12-19 MED ORDER — SODIUM CHLORIDE 0.9 % IV SOLN
INTRAVENOUS | Status: DC
  Administered 2014-12-19 (×2): via INTRAVENOUS

## 2014-12-19 MED ORDER — POTASSIUM CHLORIDE CRYS ER 20 MEQ PO TBCR
40.0000 meq | EXTENDED_RELEASE_TABLET | Freq: Once | ORAL | Status: AC
  Administered 2014-12-19: 40 meq via ORAL
  Filled 2014-12-19: qty 2

## 2014-12-19 MED ORDER — AMLODIPINE BESYLATE 5 MG PO TABS
5.0000 mg | ORAL_TABLET | Freq: Every day | ORAL | Status: DC
  Administered 2014-12-19 – 2014-12-20 (×2): 5 mg via ORAL
  Filled 2014-12-19 (×2): qty 1

## 2014-12-19 MED ORDER — NITROGLYCERIN 0.4 MG SL SUBL: 0.4000 mg | SUBLINGUAL_TABLET | SUBLINGUAL | Status: DC | PRN

## 2014-12-19 MED ORDER — ATORVASTATIN CALCIUM 40 MG PO TABS
40.0000 mg | ORAL_TABLET | Freq: Every day | ORAL | Status: DC
  Administered 2014-12-19: 40 mg via ORAL
  Filled 2014-12-19 (×2): qty 1

## 2014-12-19 MED ORDER — POTASSIUM CHLORIDE 10 MEQ/100ML IV SOLN
10.0000 meq | INTRAVENOUS | Status: AC
  Administered 2014-12-19: 10 meq via INTRAVENOUS
  Filled 2014-12-19: qty 100

## 2014-12-19 MED ORDER — ACETAMINOPHEN 325 MG PO TABS
650.0000 mg | ORAL_TABLET | Freq: Four times a day (QID) | ORAL | Status: DC | PRN
  Administered 2014-12-19 (×2): 650 mg via ORAL
  Filled 2014-12-19 (×3): qty 2

## 2014-12-19 MED ORDER — ACETAMINOPHEN 650 MG RE SUPP: 650.0000 mg | Freq: Four times a day (QID) | RECTAL | Status: DC | PRN

## 2014-12-19 MED ORDER — PNEUMOCOCCAL VAC POLYVALENT 25 MCG/0.5ML IJ INJ
0.5000 mL | INJECTION | INTRAMUSCULAR | Status: AC
  Administered 2014-12-20: 0.5 mL via INTRAMUSCULAR
  Filled 2014-12-19: qty 0.5

## 2014-12-19 MED ORDER — HEPARIN SODIUM (PORCINE) 5000 UNIT/ML IJ SOLN
5000.0000 [IU] | Freq: Three times a day (TID) | INTRAMUSCULAR | Status: DC
  Administered 2014-12-19 – 2014-12-20 (×4): 5000 [IU] via SUBCUTANEOUS
  Filled 2014-12-19 (×6): qty 1

## 2014-12-19 MED ORDER — ASPIRIN EC 81 MG PO TBEC
81.0000 mg | DELAYED_RELEASE_TABLET | Freq: Every day | ORAL | Status: DC
  Administered 2014-12-19 – 2014-12-20 (×2): 81 mg via ORAL
  Filled 2014-12-19 (×2): qty 1

## 2014-12-19 MED ORDER — NICOTINE 14 MG/24HR TD PT24
14.0000 mg | MEDICATED_PATCH | Freq: Every day | TRANSDERMAL | Status: DC
  Administered 2014-12-19: 14 mg via TRANSDERMAL
  Filled 2014-12-19 (×2): qty 1

## 2014-12-19 NOTE — Evaluation (Signed)
Physical Therapy Evaluation Patient Details Name: Becky Lara MRN: [id number] DOB: 1955-11-12 Today's Date: 12/19/2014   History of Present Illness  59 y.o. female admitted with chest pain and blurry vision.  Clinical Impression  Pt admitted with the above complications. Pt currently with functional limitations due to the deficits listed below (see PT Problem List). Very slow guarded gait pattern, but no loss of balance requiring assistance. Declines use of an assistive device. HR 77 at rest to 90s while ambulating with SpO2 98% on room air. Complains of Lt sided headache which "radiates" into her chest and is made worse by active cervical range of motion (rotation and lateral flexion.) These motions are restricted and she was educated on gentle stretching activities to promote ROM. Recommend outpatient PT follow up. Pt will benefit from skilled PT to increase their independence and safety with mobility to allow discharge to the venue listed below.       Follow Up Recommendations Outpatient PT    Equipment Recommendations  None recommended by PT    Recommendations for Other Services OT consult     Precautions / Restrictions Precautions Precautions: None Restrictions Weight Bearing Restrictions: No      Mobility  Bed Mobility Overal bed mobility: Independent                Transfers Overall transfer level: Modified independent                  Ambulation/Gait Ambulation/Gait assistance: Supervision Ambulation Distance (Feet): 150 Feet Assistive device: None Gait Pattern/deviations: Step-through pattern;Decreased stride length (minimal rotation and arm swinging) Gait velocity: very slow Gait velocity interpretation: Below normal speed for age/gender General Gait Details: Pt with very guarded gait pattern, no loss of balance noted however intermittently touching rail in hallway. Turns head slowly but denies change in symptoms. Able to turn head left and right  slowly during gait without notable change in balance.  Stairs            Wheelchair Mobility    Modified Rankin (Stroke Patients Only)       Balance Overall balance assessment: Needs assistance Sitting-balance support: Feet supported;No upper extremity supported Sitting balance-Leahy Scale: Normal     Standing balance support: No upper extremity supported Standing balance-Leahy Scale: Good                               Pertinent Vitals/Pain Pain Assessment: 0-10 Pain Score: 6  Pain Location: chest Pain Descriptors / Indicators: Tightness Pain Intervention(s): Monitored during session;Repositioned    Home Living Family/patient expects to be discharged to:: Private residence Living Arrangements: Spouse/significant other Available Help at Discharge: Family;Available 24 hours/day Type of Home: House Home Access: Stairs to enter Entrance Stairs-Rails: Chemical engineer of Steps: 4 Home Layout: One level Home Equipment: None      Prior Function Level of Independence: Independent         Comments: Works full time at a factory for [employer]   Dominant Hand: Right    Extremity/Trunk Assessment   Upper Extremity Assessment: Defer to OT evaluation           Lower Extremity Assessment: Overall WFL for tasks assessed      Cervical / Trunk Assessment: Kyphotic  Communication   Communication: No difficulties  Cognition Arousal/Alertness: Awake/alert Behavior During Therapy: WFL for tasks assessed/performed Overall Cognitive Status: Within Functional Limits for tasks assessed  General Comments General comments (skin integrity, edema, etc.): Very limited cervical ROM with rotation and lateral flexion. All of these motions increase her Lt sided headache. Educated on gentle streching techniques.    Exercises Other Exercises Other Exercises: Cervical rotation and lateral  flexion with self-applied manual overpressure to tolerance. x30 seconds. Instructed to perform on her own several times per day, in pain free range.      Assessment/Plan    PT Assessment Patient needs continued PT services  PT Diagnosis Abnormality of gait;Acute pain   PT Problem List Decreased range of motion;Decreased activity tolerance;Decreased balance;Decreased mobility;Decreased knowledge of use of DME;Pain  PT Treatment Interventions Gait training;DME instruction;Stair training;Functional mobility training;Therapeutic activities;Therapeutic exercise;Balance training;Neuromuscular re-education;Patient/family education;Modalities   PT Goals (Current goals can be found in the Care Plan section) Acute Rehab PT Goals Patient Stated Goal: none stated PT Goal Formulation: With patient Time For Goal Achievement: 01/02/15 Potential to Achieve Goals: Good    Frequency Min 3X/week   Barriers to discharge        Co-evaluation               End of Session   Activity Tolerance: Patient tolerated treatment well Patient left: in bed;with call bell/phone within reach;with family/visitor present Nurse Communication: Mobility status         Time: 1000-1023 PT Time Calculation (min) (ACUTE ONLY): 23 min   Charges:   PT Evaluation $Initial PT Evaluation Tier I: 1 Procedure PT Treatments $Therapeutic Exercise: 8-22 mins   PT G Codes:        Ellouise Newer 12/19/2014, 10:58 AM Elayne Snare, [place]

## 2014-12-19 NOTE — Progress Notes (Signed)
Utilization review completed.  

## 2014-12-19 NOTE — H&P (Signed)
[facility] History and Physical  Becky Lara LDJ:[id number] DOB: 07/30/1956 DOA: 12/18/2014  Referring physician: ED physician PCP: No primary care provider on file.  Specialists:   Chief Complaint: Chest pain and blurry vision  HPI: Becky Lara is a 59 y.o. female with past medical history of hypertension, tobacco abuse, who presents with chest pain and a blurry vision.  Patient reports that at about 9 PM, she started having chest pain. The chest pain is located under the left breast. It is sharp, 6-7 out of 10 in severity, nonradiating. It is not pleuritic. It is associated with shortness of breath, dizziness and sweating. She also has a mild headache. Patient does not have fever, chills, cough. Her chest pain resolved after treated with nitroglycerin and aspirin.   Patient also reported having blurry vision. It has been going on for more than 4 weeks. She was evaluated by the ophthalmologist and was told that her eye examination was okay. She was given prescription for glass, which did not help. She still has a blurry vision. She also has intermittent numbness in left arm and left face. Patient does not have unilateral weakness. No hearing loss. Patient denies fever, chills, abdominal pain, diarrhea, dysuria, urgency, frequency, hematuria, skin rashes or leg swelling.   The emergency room, patient was found to have a leukocytosis with WBC 12.7, negative troponin, BNP 217, hypokalemia with potassium 2.6, AKI, negative CT had a full acute abnormalities. CXR showed shallow inspiration with elevation of right hemidiaphragm and atelectasis in the lung bases, greater on the right.  Review of Systems: As presented in the history of presenting illness, rest negative.  Where does patient live? At home  Can patient participate in ADLs? Yes  Allergy: No Known Allergies  Past Medical History  Diagnosis Date  . Hypertension     History reviewed. No pertinent past surgical  history.  Social History:  reports that she has been smoking Cigarettes.  She has been smoking about 0.05 packs per day. She does not have any smokeless tobacco history on file. She reports that she drinks alcohol. She reports that she does not use illicit drugs.  Family History:  Family History  Problem Relation Age of Onset  . Diabetes Mother   . Heart attack Mother   . Hypertension Brother      Prior to Admission medications   Not on File    Physical Exam: Filed Vitals:   12/18/14 2315 12/18/14 2345 12/19/14 0045 12/19/14 0153  BP: 134/74 130/68 133/74 148/88  Pulse: 88 82 85 86  Temp:    98.5 F (36.9 C)  TempSrc:    Oral  Resp:  13 19 18   Height:    5\' 5"  (1.651 m)  Weight:    103.4 kg (227 lb 15.3 oz)  SpO2: 100% 98% 99% 99%   General: Not in acute distress HEENT:       Eyes: PERRL, EOMI, no scleral icterus       ENT: No discharge from the ears and nose, no pharynx injection, no tonsillar enlargement.        Neck: No JVD, no bruit, no mass felt. Cardiac: S1/S2, RRR, No murmurs, No gallops or rubs Pulm: Good air movement bilaterally. Clear to auscultation bilaterally. No rales, wheezing, rhonchi or rubs. Abd: Soft, nondistended, nontender, no rebound pain, no organomegaly, BS present Ext: No edema bilaterally. 2+DP/PT pulse bilaterally Musculoskeletal: No joint deformities, erythema, or stiffness, ROM full Skin: No rashes.  Neuro: Alert and oriented  X3, cranial nerves II-XII grossly intact, muscle strength 5/5 in all extremeties, sensation to light touch intact. Brachial reflex 2+ bilaterally. Knee reflex 1+ bilaterally. Negative Babinski's sign. Normal finger to nose test. Psych: Patient is not psychotic, no suicidal or hemocidal ideation.  Labs on Admission:  Basic Metabolic Panel:  Recent Labs Lab 12/18/14 2205 12/19/14 0025  NA 138  --   K 2.6*  --   CL 99  --   CO2 28  --   GLUCOSE 156*  --   BUN 13  --   CREATININE 1.54*  --   CALCIUM 9.8  --   MG   --  2.2   Liver Function Tests:  Recent Labs Lab 12/18/14 2205  AST 21  ALT 17  ALKPHOS 87  BILITOT 1.1  PROT 8.4*  ALBUMIN 4.0   No results for input(s): LIPASE, AMYLASE in the last 168 hours. No results for input(s): AMMONIA in the last 168 hours. CBC:  Recent Labs Lab 12/18/14 2205  WBC 12.7*  NEUTROABS 9.3*  HGB 14.5  HCT 42.4  MCV 84.0  PLT 192   Cardiac Enzymes:  Recent Labs Lab 12/18/14 2205  TROPONINI <0.03    BNP (last 3 results)  Recent Labs  12/18/14 2205  BNP 217.7*    ProBNP (last 3 results) No results for input(s): PROBNP in the last 8760 hours.  CBG: No results for input(s): GLUCAP in the last 168 hours.  Radiological Exams on Admission: Dg Chest 2 View  12/19/2014   CLINICAL DATA:  Hervey Ard left-sided chest pain.  EXAM: CHEST  2 VIEW  COMPARISON:  04/21/2010  FINDINGS: Shallow inspiration with elevation of the right hemidiaphragm. Linear atelectasis in both lung bases, greater on the right. Normal heart size and pulmonary vascularity. No focal consolidation in the lungs. No blunting of costophrenic angles. No pneumothorax. Degenerative changes in the spine.  IMPRESSION: Shallow inspiration with elevation of right hemidiaphragm. Atelectasis in the lung bases, greater on the right.   Electronically Signed   By: Lucienne Capers M.D.   On: 12/19/2014 00:29   Ct Head Wo Contrast  12/19/2014   CLINICAL DATA:  Bilateral blurry vision and headaches for 1 month. Hypertension.  EXAM: CT HEAD WITHOUT CONTRAST  TECHNIQUE: Contiguous axial images were obtained from the base of the skull through the vertex without intravenous contrast.  COMPARISON:  None.  FINDINGS: Ventricles and sulci appear symmetrical. Low-attenuation changes throughout the deep white matter likely due to small vessel ischemia although nonspecific and other demyelinating or dysmyelinating processes could also have this appearance. No mass effect or midline shift. No abnormal extra-axial  fluid collections. Gray-white matter junctions are distinct. Basal cisterns are not effaced. No evidence of acute intracranial hemorrhage. No depressed skull fractures. Visualized paranasal sinuses and mastoid air cells are not opacified.  IMPRESSION: Nonspecific diffuse white matter disease. No acute intracranial hemorrhage or mass effect.   Electronically Signed   By: Lucienne Capers M.D.   On: 12/19/2014 00:28    EKG: Independently reviewed. Nonspecific change  Assessment/Plan Principal Problem:   Chest pain Active Problems:   HTN (hypertension)   Tobacco abuse   Hypokalemia   Blurry vision, bilateral   AKI (acute kidney injury)  Chest pain: Patient does not have a pneumonia on chest x-ray. She does not have risk factors for pulmonary embolism. Is to rule out ACS given her risk factors, including hypertension, old age and tobacco abuse. Currently she is chest pain-free.  - will admit to  Tele bed  - cycle trop q6 x3 and repeat her EKG in the am  - Nitroglycerin, Morphine, and aspirin, lipitor  - Risk factor stratification: will check FLP and A1C, UDS - Consider cardiology consult if test positive for CEs  - 2d echo  Hypokalemia: Passing 2.6 on admission. -Repleted with 10 mEq potassium chloride x 2 by IV, followed by 40 mEq potassium chloride orally -BMP in morning  AKI: Not sure whether this is acute renal failure or chronically progressive kidney disease. Her previous creatinine was 1.4 on 03/13/10, no recent record available. -FeNa and renal ultrasound -IVF: 1 L of normal saline, followed by then 5 mL per hour -Follow-up renal function, BMP  Blurry vision: She also has intermittent left arm and face numbness. Patient was evaluated by ophthalmologist, and was told no retinal problem. Needs to rule out stroke. -MRI-brain without contrast -On aspirin and Lipitor as above  Tobacco abuse: -Nicotine patch  Hypertension: Patient does not remember what medications she was on at  home. -Start low-dose amlodipine, 5 mg daily  Leukocytosis: Likely due to stress secondary to chest pain. Urinalysis showed trace amount of leukocytes, but patient is asymptomatic. -Follow-up urine culture  DVT ppx: SQ Heparin       Code Status: Full code Family Communication: None at bed side.    Disposition Plan: Admit to inpatient   Date of Service 12/19/2014    Ivor Costa [facility] Pager [phone]  If 7PM-7AM, please contact night-coverage [email or web address] Password [facility] 12/19/2014, 3:38 AM

## 2014-12-19 NOTE — Progress Notes (Deleted)
  Echocardiogram 2D Echocardiogram has been performed.  Darlina Sicilian M 12/19/2014, 12:28 PM

## 2014-12-19 NOTE — Progress Notes (Signed)
Patient seen and examined. Admitted after midnight secondary to CP and blurred vision. CT head and MRI neg for acute CVA. Patient CP improved. In the process of completing ACS r/o work up. For further details and info please referred to H&P by Dr. Soledad Gerlach.  Plan: -follow 2-D echo -no acute CVA on CT head or MRI -will need ophthalmology assessment at discharge (still with blurred vision) -troponin so far negative and 2-D echo pending  Barton Dubois [phone]

## 2014-12-19 NOTE — Progress Notes (Signed)
OT Cancellation Note  Patient Details Name: Becky Lara MRN: [id number] DOB: May 06, 1956   Cancelled Treatment:    Reason Eval/Treat Not Completed: Medical issues which prohibited therapy (MRI. Will continue to follow.)  Malka So 12/19/2014, 3:34 PM

## 2014-12-20 DIAGNOSIS — I10 Essential (primary) hypertension: Secondary | ICD-10-CM

## 2014-12-20 DIAGNOSIS — E876 Hypokalemia: Secondary | ICD-10-CM

## 2014-12-20 DIAGNOSIS — K219 Gastro-esophageal reflux disease without esophagitis: Secondary | ICD-10-CM | POA: Insufficient documentation

## 2014-12-20 DIAGNOSIS — N179 Acute kidney failure, unspecified: Secondary | ICD-10-CM

## 2014-12-20 DIAGNOSIS — R079 Chest pain, unspecified: Secondary | ICD-10-CM

## 2014-12-20 DIAGNOSIS — Z720 Tobacco use: Secondary | ICD-10-CM

## 2014-12-20 LAB — HEMOGLOBIN A1C
HEMOGLOBIN A1C: 5.3 % (ref 4.8–5.6)
Mean Plasma Glucose: 105 mg/dL

## 2014-12-20 LAB — URINE CULTURE: Colony Count: 3000

## 2014-12-20 LAB — GLUCOSE, CAPILLARY: Glucose-Capillary: 99 mg/dL (ref 70–99)

## 2014-12-20 MED ORDER — PANTOPRAZOLE SODIUM 40 MG PO TBEC: 40.0000 mg | DELAYED_RELEASE_TABLET | Freq: Every day | ORAL | Status: DC

## 2014-12-20 MED ORDER — NICOTINE 14 MG/24HR TD PT24: 14.0000 mg | MEDICATED_PATCH | Freq: Every day | TRANSDERMAL | Status: DC

## 2014-12-20 MED ORDER — HYDROCHLOROTHIAZIDE 12.5 MG PO CAPS: 12.5000 mg | ORAL_CAPSULE | Freq: Every day | ORAL | Status: DC

## 2014-12-20 MED ORDER — ASPIRIN 81 MG PO TBEC: 81.0000 mg | DELAYED_RELEASE_TABLET | Freq: Every day | ORAL | Status: AC

## 2014-12-20 MED ORDER — POTASSIUM CHLORIDE ER 10 MEQ PO TBCR: 20.0000 meq | EXTENDED_RELEASE_TABLET | Freq: Every day | ORAL | Status: DC

## 2014-12-20 NOTE — Progress Notes (Signed)
Occupational Therapy Evaluation Patient Details Name: Becky Lara MRN: [id number] DOB: [birth year] Today's Date: 12/20/2014    History of Present Illness 59 y.o. female admitted with chest pain and blurry vision.   Clinical Impression   Pt appears to be functonal @ baseline level regarding her ADL and mobility. Pt continues to c/o blurry vision. Pt to follow up with her opthalmologist. OT signing off.     Follow Up Recommendations  Supervision - Intermittent    Equipment Recommendations  None recommended by OT    Recommendations for Other Services       Precautions / Restrictions Precautions Precautions: None      Mobility Bed Mobility Overal bed mobility: Independent                Transfers Overall transfer level: Independent                    Balance Overall balance assessment: No apparent balance deficits (not formally assessed)                                          ADL Overall ADL's : Modified independent                                       General ADL Comments: Pt given writtenhandout on home safety for low vision. discussed modifications inorder to reduce risk of falls/injury     Vision Vision Assessment?: Yes Eye Alignment: Within Functional Limits Ocular Range of Motion: Within Functional Limits Alignment/Gaze Preference: Within Defined Limits Tracking/Visual Pursuits: Able to track stimulus in all quads without difficulty Saccades: Within functional limits Convergence: Within functional limits Visual Fields: No apparent deficits Additional Comments: Pt states she has had blurred vision x 2-3 months. States magnifying glasses do not help.    Perception     Praxis      Pertinent Vitals/Pain Pain Assessment: No/denies pain     Hand Dominance Right   Extremity/Trunk Assessment Upper Extremity Assessment Upper Extremity Assessment: Overall WFL for tasks assessed   Lower Extremity  Assessment Lower Extremity Assessment: Overall WFL for tasks assessed (c/o history RLE pain/numbness down lateral side of leg )   Cervical / Trunk Assessment Cervical / Trunk Assessment: Other exceptions (pt states she has a "slipped disc")   Communication Communication Communication: No difficulties   Cognition Arousal/Alertness: Awake/alert Behavior During Therapy: WFL for tasks assessed/performed Overall Cognitive Status: Within Functional Limits for tasks assessed                     General Comments       Exercises       Shoulder Instructions      Home Living Family/patient expects to be discharged to:: Private residence Living Arrangements: Spouse/significant other Available Help at Discharge: Family;Available 24 hours/day Type of Home: House Home Access: Stairs to enter [employer] of Steps: 4 Entrance Stairs-Rails: Left;Right Home Layout: One level     Bathroom Shower/Tub: Tub/shower unit Shower/tub characteristics: Architectural technologist: Standard     Home Equipment: None          Prior Functioning/Environment Level of Independence: Independent        Comments: Works full time at a [place] for [employer]    OT Diagnosis: Blindness and low vision  OT Problem List: Impaired vision/perception   OT Treatment/Interventions:      OT Goals(Current goals can be found in the care plan section) Acute Rehab OT Goals Patient Stated Goal: to be able to see clearly OT Goal Formulation: All assessment and education complete, DC therapy  OT Frequency:     Barriers to D/C:            Co-evaluation              End of Session Nurse Communication: Mobility status  Activity Tolerance: Patient tolerated treatment well Patient left: in bed;with call bell/phone within reach;with family/visitor present   Time: 1415-1431 OT Time Calculation (min): 16 min Charges:  OT General Charges $OT Visit: 1 Procedure OT  Evaluation $Initial OT Evaluation Tier I: 1 Procedure G-Codes: OT G-codes **NOT FOR INPATIENT CLASS** Functional Assessment Tool Used: clinical judgement  Functional Limitation: Self care Self Care Current Status (W9295): 0 percent impaired, limited or restricted Self Care Goal Status (F4734): 0 percent impaired, limited or restricted Self Care Discharge Status (Y3709): 0 percent impaired, limited or restricted  ,HILLARY 12/20/2014, 2:51 PM   [facility], OTR/L  [phone] 12/20/2014

## 2014-12-20 NOTE — Discharge Summary (Signed)
Physician Discharge Summary  Becky Lara MVH:[id number] DOB: 02-Jun-1956 DOA: 12/18/2014  PCP: No primary care provider on file.  Admit date: 12/18/2014 Discharge date: 12/20/2014  Time spent: >30 minutes  Recommendations for Outpatient Follow-up:  1. Reassess BP and adjust medications as needed 2. Check BMET to follow electrolytes and renal function 3. Patient to follow with ophthalmologist   Discharge Diagnoses:  Principal Problem:   Chest pain Active Problems:   HTN (hypertension)   Tobacco abuse   Hypokalemia   Blurry vision, bilateral   AKI (acute kidney injury)   Pain in the chest   Discharge Condition: stable and improved. Will discharge home with instruction to stop smoking, be compliant with medications and to follow with ophthalmology office and PCP after discharge.  Diet recommendation: heart healthy diet   Filed Weights   12/19/14 0153  Weight: 103.4 kg (227 lb 15.3 oz)    History of present illness:  Becky Lara is a 59 y.o. female with past medical history of hypertension, tobacco abuse, who presents with chest pain and a blurry vision. The chest pain is located under the left breast. It is sharp, 6-7/10 in severity, nonradiating. It is not pleuritic. It is associated with shortness of breath, dizziness and sweating; also reported some burning sensation. She also has a mild headache. Patient does not have fever, chills, cough.  Patient' blurry vision has been going on for more than 4 weeks. She was evaluated by the optometrist and was told that her vision was okay. She was given prescription for glass, which did not help. She also has intermittent numbness in left arm and left face. Patient didn't present unilateral weakness, hearing loss or any other deficit.  Hospital Course:  Chest pain: Patient does not have a pneumonia on chest x-ray. -patient remained chest pain free and troponin were neg x 3 -2-D echo w/o acute abnormalities and preserved EF. Grade  1 diastolic HF appreciated -advise to follow low sodium heart healthy diet -will discharge on PPI as her symptoms most likely due to GERD  Hypokalemia: due to diuretics use, 2.6 on admission. -Repleted  -will discharge on maintenance regimen   AKI: Not sure whether this is acute renal failure or chronically progressive kidney disease. Her previous creatinine was 1.4 on 03/13/10, no recent record available since then. -no UA abnormalities suggesting infection; no hydronephrosis on renal ultrasound -Cr improved with IV resuscitation and holding HCTZ for 48 hours -advise to maintain good hydration -Follow-up renal function, as an outpatient  Blurry vision: She also has intermittent left arm and face numbness. Patient was evaluated by optometrist as an outpatient. -MRI-brain without contrast and CT head negative for acute intracranial abnormality -continue ASA and lipitor -will now follow with ophthalmologist as an outpatient  Tobacco abuse: -cessation counseling provided -Nicotine patch ordered   Hypertension:  -Started on amlodipine and HCTZ -advise to follow low sodium diet  Leukocytosis: Likely due to stress demargination -no signs of PNA on CXR and no UTI on UA/urine cx -patient w/o fever -after IVF's given WBC's back to normal limits  Procedures:  2-D echo - Left ventricle: The cavity size was normal. Wall thickness was increased in a pattern of mild LVH. Systolic function was normal. The estimated ejection fraction was in the range of 60% to 65%. Doppler parameters are consistent with abnormal left ventricular relaxation (grade 1 diastolic dysfunction).  Consultations:  None   Discharge Exam: Filed Vitals:   12/20/14 1317  BP: 131/82  Pulse: 79  Temp: 97.8 F (36.6 C)  Resp: 18    General: afebrile, still with mild bilateral blurred vision sensation. No focal weakness or any other neurologic abnormalities. Denies CP and SOB Cardiovascular: S1 and s2,  no rubs or gallops Respiratory: CTA bilaterally Abd: soft, NT, ND positive BS Extremities: no edema or cyanosis   Discharge Instructions   Discharge Instructions    Diet - low sodium heart healthy    Complete by:  As directed      Discharge instructions    Complete by:  As directed   Maintain good hydration Take medications as prescribed Follow with ophthalmology as recommended Follow a low sodium heart healthy diet          Current Discharge Medication List    START taking these medications   Details  aspirin EC 81 MG EC tablet Take 1 tablet (81 mg total) by mouth daily.    hydrochlorothiazide (MICROZIDE) 12.5 MG capsule Take 1 capsule (12.5 mg total) by mouth daily. Qty: 30 capsule, Refills: 1    nicotine (NICODERM CQ - DOSED IN MG/24 HOURS) 14 mg/24hr patch Place 1 patch (14 mg total) onto the skin daily. Qty: 28 patch, Refills: 0    pantoprazole (PROTONIX) 40 MG tablet Take 1 tablet (40 mg total) by mouth daily. Qty: 30 tablet, Refills: 1    potassium chloride (K-DUR) 10 MEQ tablet Take 2 tablets (20 mEq total) by mouth daily. Qty: 60 tablet, Refills: 0      CONTINUE these medications which have NOT CHANGED   Details  amLODipine (NORVASC) 10 MG tablet Take 10 mg by mouth daily.    clobetasol cream (TEMOVATE) 5.28 % Apply 1 application topically daily. Apply to affected area on body daily.    lovastatin (MEVACOR) 20 MG tablet Take 20 mg by mouth daily.    mometasone (ELOCON) 0.1 % cream Apply 1 application topically daily. Apply to affected area on genitals daily.    PRESCRIPTION MEDICATION Apply 1 application topically daily.      STOP taking these medications     hydrochlorothiazide (HYDRODIURIL) 25 MG tablet        No Known Allergies Follow-up Information    Follow up with Katy Apo, MD.   Specialty:  Ophthalmology   Why:  office to set up appointment    Contact information:   [address] [place] [id number] [phone]       The  results of significant diagnostics from this hospitalization (including imaging, microbiology, ancillary and laboratory) are listed below for reference.    Significant Diagnostic Studies: Dg Chest 2 View  12/19/2014   CLINICAL DATA:  Sharp left-sided chest pain.  EXAM: CHEST  2 VIEW  COMPARISON:  04/21/2010  FINDINGS: Shallow inspiration with elevation of the right hemidiaphragm. Linear atelectasis in both lung bases, greater on the right. Normal heart size and pulmonary vascularity. No focal consolidation in the lungs. No blunting of costophrenic angles. No pneumothorax. Degenerative changes in the spine.  IMPRESSION: Shallow inspiration with elevation of right hemidiaphragm. Atelectasis in the lung bases, greater on the right.   Electronically Signed   By: Lucienne Capers M.D.   On: 12/19/2014 00:29   Ct Head Wo Contrast  12/19/2014   CLINICAL DATA:  Bilateral blurry vision and headaches for 1 month. Hypertension.  EXAM: CT HEAD WITHOUT CONTRAST  TECHNIQUE: Contiguous axial images were obtained from the base of the skull through the vertex without intravenous contrast.  COMPARISON:  None.  FINDINGS: Ventricles and  sulci appear symmetrical. Low-attenuation changes throughout the deep white matter likely due to small vessel ischemia although nonspecific and other demyelinating or dysmyelinating processes could also have this appearance. No mass effect or midline shift. No abnormal extra-axial fluid collections. Gray-white matter junctions are distinct. Basal cisterns are not effaced. No evidence of acute intracranial hemorrhage. No depressed skull fractures. Visualized paranasal sinuses and mastoid air cells are not opacified.  IMPRESSION: Nonspecific diffuse white matter disease. No acute intracranial hemorrhage or mass effect.   Electronically Signed   By: Lucienne Capers M.D.   On: 12/19/2014 00:28   Mr Brain Wo Contrast  12/19/2014   CLINICAL DATA:  Acute kidney injury.  Blurred vision.  EXAM: MRI  HEAD WITHOUT CONTRAST  TECHNIQUE: Multiplanar, multiecho pulse sequences of the brain and surrounding structures were obtained without intravenous contrast.  COMPARISON:  CT head 12/18/2014  FINDINGS: Ventricle size is normal. Cerebral volume normal. Pituitary normal in size. Optic chiasm appears normal. Craniocervical junction is normal.  Negative for acute infarct  Moderately advanced chronic microvascular ischemic change in the white matter and pons. Chronic ischemia in the basal ganglia is mild.  Negative for intracranial hemorrhage.  Negative for mass or edema.  No shift of the midline structures.  Vessels at the base of the brain are patent with normal flow void.  Paranasal sinuses are clear.  IMPRESSION: Chronic microvascular ischemic changes.  No acute abnormality.   Electronically Signed   By: Franchot Gallo M.D.   On: 12/19/2014 08:05   US Renal  12/19/2014   CLINICAL DATA:  Acute remnant renal insufficiency, history of hypertension  EXAM: RENAL/URINARY TRACT ULTRASOUND COMPLETE  COMPARISON:  None.  FINDINGS: Right Kidney:  Length: 9.6 cm. Echogenicity within normal limits. No mass or hydronephrosis visualized.  Left Kidney:  Length: 9.6 cm. Echogenicity within normal limits. No mass or hydronephrosis visualized.  Bladder:  Appears normal for degree of bladder distention.  IMPRESSION: Normal renal ultrasound examination.   Electronically Signed   By: David  Martinique   On: 12/19/2014 07:39    Microbiology: Recent Results (from the past 240 hour(s))  Urine culture     Status: None   Collection Time: 12/18/14 11:05 PM  Result Value Ref Range Status   Specimen Description URINE, CLEAN CATCH  Final   Special Requests NONE  Final   Colony Count   Final    3,000 COLONIES/ML Performed at [facility]    Culture   Final    INSIGNIFICANT GROWTH Performed at [facility]    Report Status 12/20/2014 FINAL  Final     Labs: Basic Metabolic Panel:  Recent Labs Lab 12/18/14 2205  12/19/14 0025 12/19/14 0804  NA 138  --  141  K 2.6*  --  3.1*  CL 99  --  105  CO2 28  --  25  GLUCOSE 156*  --  105*  BUN 13  --  11  CREATININE 1.54*  --  1.25*  CALCIUM 9.8  --  9.1  MG  --  2.2  --    Liver Function Tests:  Recent Labs Lab 12/18/14 2205  AST 21  ALT 17  ALKPHOS 87  BILITOT 1.1  PROT 8.4*  ALBUMIN 4.0   CBC:  Recent Labs Lab 12/18/14 2205 12/19/14 0804  WBC 12.7* 10.4  NEUTROABS 9.3*  --   HGB 14.5 13.5  HCT 42.4 39.1  MCV 84.0 84.1  PLT 192 179   Cardiac Enzymes:  Recent Labs  Lab 12/18/14 2205 12/19/14 0259 12/19/14 0804 12/19/14 1335  TROPONINI <0.03 <0.03 <0.03 <0.03   BNP: BNP (last 3 results)  Recent Labs  12/18/14 2205  BNP 217.7*    CBG:  Recent Labs Lab 12/19/14 0621 12/19/14 1130 12/20/14 0617  GLUCAP 102* 113* 99    Signed:  Barton Dubois  [facility] 12/20/2014, 2:29 PM

## 2014-12-20 NOTE — Progress Notes (Signed)
  Echocardiogram 2D Echocardiogram has been performed.  Becky Lara 12/20/2014, 10:04 AM

## 2014-12-25 NOTE — Progress Notes (Addendum)
Physical Therapy Note  Late G-Code entry:    2014-12-29 1000  PT G-Codes **NOT FOR INPATIENT CLASS**  Functional Assessment Tool Used Clinical Observation  Functional Limitation Mobility: Walking and moving around  Mobility: Walking and Moving Around Current Status [phone]) CI  Mobility: Walking and Moving Around Goal Status [phone]) CI   Camille Bal [place], [place]

## 2015-11-27 ENCOUNTER — Ambulatory Visit (INDEPENDENT_AMBULATORY_CARE_PROVIDER_SITE_OTHER): Payer: BLUE CROSS/BLUE SHIELD | Admitting: Family Medicine

## 2015-11-27 ENCOUNTER — Encounter: Payer: Self-pay | Admitting: Family Medicine

## 2015-11-27 VITALS — BP 168/100 | HR 84 | Wt 234.0 lb

## 2015-11-27 DIAGNOSIS — Z8639 Personal history of other endocrine, nutritional and metabolic disease: Secondary | ICD-10-CM | POA: Diagnosis not present

## 2015-11-27 DIAGNOSIS — T490X5A Adverse effect of local antifungal, anti-infective and anti-inflammatory drugs, initial encounter: Secondary | ICD-10-CM | POA: Diagnosis not present

## 2015-11-27 DIAGNOSIS — K219 Gastro-esophageal reflux disease without esophagitis: Secondary | ICD-10-CM | POA: Diagnosis not present

## 2015-11-27 DIAGNOSIS — I10 Essential (primary) hypertension: Secondary | ICD-10-CM

## 2015-11-27 DIAGNOSIS — L909 Atrophic disorder of skin, unspecified: Secondary | ICD-10-CM

## 2015-11-27 DIAGNOSIS — L304 Erythema intertrigo: Secondary | ICD-10-CM | POA: Diagnosis not present

## 2015-11-27 DIAGNOSIS — R238 Other skin changes: Secondary | ICD-10-CM

## 2015-11-27 LAB — COMPREHENSIVE METABOLIC PANEL
ALBUMIN: 3.8 g/dL (ref 3.6–5.1)
ALK PHOS: 75 U/L (ref 33–130)
ALT: 10 U/L (ref 6–29)
AST: 14 U/L (ref 10–35)
BILIRUBIN TOTAL: 0.9 mg/dL (ref 0.2–1.2)
BUN: 14 mg/dL (ref 7–25)
CALCIUM: 8.9 mg/dL (ref 8.6–10.4)
CO2: 26 mmol/L (ref 20–31)
CREATININE: 1.07 mg/dL — AB (ref 0.50–0.99)
Chloride: 108 mmol/L (ref 98–110)
Glucose, Bld: 89 mg/dL (ref 65–99)
Potassium: 4.1 mmol/L (ref 3.5–5.3)
SODIUM: 141 mmol/L (ref 135–146)
TOTAL PROTEIN: 7.2 g/dL (ref 6.1–8.1)

## 2015-11-27 LAB — CBC WITH DIFFERENTIAL/PLATELET
BASOS ABS: 0.1 10*3/uL (ref 0.0–0.1)
BASOS PCT: 1 % (ref 0–1)
EOS ABS: 0.2 10*3/uL (ref 0.0–0.7)
EOS PCT: 2 % (ref 0–5)
HCT: 42.5 % (ref 36.0–46.0)
Hemoglobin: 13.9 g/dL (ref 12.0–15.0)
LYMPHS ABS: 2.5 10*3/uL (ref 0.7–4.0)
Lymphocytes Relative: 24 % (ref 12–46)
MCH: 28.7 pg (ref 26.0–34.0)
MCHC: 32.7 g/dL (ref 30.0–36.0)
MCV: 87.8 fL (ref 78.0–100.0)
MPV: 12.3 fL (ref 8.6–12.4)
Monocytes Absolute: 0.5 10*3/uL (ref 0.1–1.0)
Monocytes Relative: 5 % (ref 3–12)
Neutro Abs: 7 10*3/uL (ref 1.7–7.7)
Neutrophils Relative %: 68 % (ref 43–77)
PLATELETS: 175 10*3/uL (ref 150–400)
RBC: 4.84 MIL/uL (ref 3.87–5.11)
RDW: 14.1 % (ref 11.5–15.5)
WBC: 10.3 10*3/uL (ref 4.0–10.5)

## 2015-11-27 MED ORDER — AMLODIPINE BESYLATE 10 MG PO TABS: 10.0000 mg | ORAL_TABLET | Freq: Every day | ORAL | Status: DC

## 2015-11-27 MED ORDER — HYDROCHLOROTHIAZIDE 12.5 MG PO CAPS: 12.5000 mg | ORAL_CAPSULE | Freq: Every day | ORAL | Status: DC

## 2015-11-27 MED ORDER — CEPHALEXIN 500 MG PO CAPS: 500.0000 mg | ORAL_CAPSULE | Freq: Three times a day (TID) | ORAL | Status: DC

## 2015-11-27 NOTE — Progress Notes (Signed)
Subjective:    Patient ID: Becky Lara, female    DOB: 10/07/1956, 60 y.o.   MRN: [id number]  HPI Chief Complaint  Patient presents with  . New Patient (Initial Visit)    states she has been off bp pills for 8 months. records not in chart. seen dr Allyson Sabal for this rash like thing under breast and belly.    She is new to the practice and here to establish primary care. Last physical exam 1 year ago.  She has acute complaints of being without her blood pressure medication for almost a year. She denies headache, dizziness, chest pain, palpitations, DOE or LE edema.  She also complains of skin irritation to folds beneath her abdomen. She states she has seen Dr. Allyson Sabal for this in past and was given a steroid cream. She states she was given a different steroid cream (clebetasol) for a different skin issue per patient. She has been using both steroid creams to the skin fold on the underneath side of her abdomen and is complaining of the area feeling sore and having a small area that is discolored and seems to be broken skin. She has not seen Dr. Allyson Sabal for this in many months and does not have a follow up appt.  Denies fever, chills, fatigue, body aches.   LMP: age 22.  Last pap smear: about 5 years ago. No history of abnormal pap smears.  She was having chest pain last year and stayed overnight in the hospital. Cardiac ruled out and GERD determined to be cause. She states she has not been bothered with this since. She watches her diet.  Took acid reflux mediation for a couple of weeks and it helped with the pain but she stopped taking it.  Other providers: Dr Allyson Sabal PMH: HTN, GERD, history of hypokalemia  Surgeries: tubal ligation.   Family history: father died at 26 of MI, Mother died of MI at 23.   Social history: smokes marijuana 3 times per week. Drinks alcohol 2-3 glasses of vodka per week. No other drug use.  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 Switches shifts weekly, day to  night. Doing it   Takes 3 aleve PM to sleep  Also takes 4 aleve before work for past 4-5 years. Does this 2 days one week and 3 the next.  Lives with son.   Colonoscopy: never Mammogram: last year   Review of Systems Pertinent positives and negatives in the history of present illness.     Objective:   Physical Exam BP 168/100 mmHg  Pulse 84  Wt 234 lb (106.142 kg)  SpO2 98%  LMP 04/26/2008 Alert and in no distress.  Cardiac exam shows a regular sinus rhythm without murmurs or gallops. Lungs are clear to auscultation. Skin to fold of abdomen with discreet areas of skin atrophy, discoloration, and skin breakdown without maceration, induration, or drainage. 1 cm area to left lower fold with excessive skin breakdown.        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 She will take 1/2 tablet of amlodipine for first  7 days. Recommend low salt diet and keeping an eye on her blood pressure at home for next week.  Recommend that she continue watching her food triggers for reflux and avoid eating at least 3 hours before laying down.  She appears to have significant skin atrophy from chronic use of topical steroids with a small area that looks like it could be an early infection. She will stop using topical steroids, start using powder to keep skin fold dry. Also recommend taking course of Keflex in case of possible skin infection. She needs to follow up with Dr. Allyson Sabal also, she agrees  to call and schedule with him.  Discussed that she is taking Aleve too often and not as instructed on the bottle. Discussed possible harmful side effects of taking too much NSAIDS including kidney and GI damage. Recommend she not take Aleve more than 2 pills twice daily. Will need to address sleep issue related to shift work at future appointment.  Spent a minimum of 45 minutes with patient and at least 50% of that was in counseling and coordination of care.

## 2015-11-27 NOTE — Patient Instructions (Signed)
Start taking half of the amlodipine (5mg ) daily for the first 7 days and then keep an eye on your blood pressure. Increase to the whole pill by day 8 if you are not having any issues with the medication.  Start taking HCTZ 12.5 mg daily. Make sure you are staying well hydrated. We will call you with lab results. Schedule an appointment with Dr. Allyson Sabal for evaluation of skin atrophy and breakdown.  Take the Keflex antibiotic 3 times daily for the next 7 days.   Do not take Aleve more than it says on the bottle. Too much of this medication can be harmful to your kidneys and stomach.   Return for physical and fasting blood work.

## 2015-12-02 ENCOUNTER — Other Ambulatory Visit (HOSPITAL_COMMUNITY)
Admission: RE | Admit: 2015-12-02 | Discharge: 2015-12-02 | Disposition: A | Discharge status: Home / Self Care | Payer: BLUE CROSS/BLUE SHIELD | Source: Ambulatory Visit | Attending: Family Medicine | Admitting: Family Medicine

## 2015-12-02 ENCOUNTER — Encounter: Payer: Self-pay | Admitting: Family Medicine

## 2015-12-02 ENCOUNTER — Ambulatory Visit (INDEPENDENT_AMBULATORY_CARE_PROVIDER_SITE_OTHER): Payer: BLUE CROSS/BLUE SHIELD | Admitting: Family Medicine

## 2015-12-02 ENCOUNTER — Encounter: Payer: Self-pay | Admitting: Internal Medicine

## 2015-12-02 VITALS — BP 124/82 | HR 64 | Ht 65.25 in | Wt 228.2 lb

## 2015-12-02 DIAGNOSIS — Z23 Encounter for immunization: Secondary | ICD-10-CM | POA: Diagnosis not present

## 2015-12-02 DIAGNOSIS — Z0000 Encounter for general adult medical examination without abnormal findings: Secondary | ICD-10-CM | POA: Diagnosis not present

## 2015-12-02 DIAGNOSIS — Z124 Encounter for screening for malignant neoplasm of cervix: Secondary | ICD-10-CM | POA: Diagnosis not present

## 2015-12-02 DIAGNOSIS — Z8639 Personal history of other endocrine, nutritional and metabolic disease: Secondary | ICD-10-CM | POA: Diagnosis not present

## 2015-12-02 DIAGNOSIS — F17200 Nicotine dependence, unspecified, uncomplicated: Secondary | ICD-10-CM

## 2015-12-02 DIAGNOSIS — Z1239 Encounter for other screening for malignant neoplasm of breast: Secondary | ICD-10-CM

## 2015-12-02 DIAGNOSIS — Z1211 Encounter for screening for malignant neoplasm of colon: Secondary | ICD-10-CM

## 2015-12-02 DIAGNOSIS — Z720 Tobacco use: Secondary | ICD-10-CM | POA: Diagnosis not present

## 2015-12-02 DIAGNOSIS — Z01419 Encounter for gynecological examination (general) (routine) without abnormal findings: Secondary | ICD-10-CM | POA: Diagnosis present

## 2015-12-02 DIAGNOSIS — I10 Essential (primary) hypertension: Secondary | ICD-10-CM

## 2015-12-02 DIAGNOSIS — Z1151 Encounter for screening for human papillomavirus (HPV): Secondary | ICD-10-CM | POA: Insufficient documentation

## 2015-12-02 DIAGNOSIS — E669 Obesity, unspecified: Secondary | ICD-10-CM | POA: Diagnosis not present

## 2015-12-02 DIAGNOSIS — N393 Stress incontinence (female) (male): Secondary | ICD-10-CM | POA: Diagnosis not present

## 2015-12-02 LAB — TSH: TSH: 1.4 mIU/L

## 2015-12-02 NOTE — Patient Instructions (Addendum)
Affordable dentures- look them up and see if they can do what you need.  Follow-up as scheduled with Dr. Allyson Sabal Call your insurance company and ask about the shingles vaccine. We willl call you in the next day or 2 and discuss your lab  You received your flu shot today and your Tdap.  The order for your screening mammogram is in the computer. Call the breast Center to schedule this. [facility] will call you to schedule your colonoscopy. Start being physically active by walking or whatever you enjoy doing. Eat a low-sodium diet no more than 2400 mg daily   Preventative Care for Adults - Female      MAINTAIN REGULAR HEALTH EXAMS:  A routine yearly physical is a good way to check in with your primary care provider about your health and preventive screening. It is also an opportunity to share updates about your health and any concerns you have, and receive a thorough all-over exam.   Most health insurance companies pay for at least some preventative services.  Check with your health plan for specific coverages.  WHAT PREVENTATIVE SERVICES DO WOMEN NEED?  Adult women should have their weight and blood pressure checked regularly.   Women age 61 and older should have their cholesterol levels checked regularly.  Women should be screened for cervical cancer with a Pap smear and pelvic exam beginning at either age 73, or 3 years after they become sexually activity.    Breast cancer screening generally begins at age 61 with a mammogram and breast exam by your primary care provider.    Beginning at age 77 and continuing to age 66, women should be screened for colorectal cancer.  Certain people may need continued testing until age 53.  Updating vaccinations is part of preventative care.  Vaccinations help protect against diseases such as the flu.  Osteoporosis is a disease in which the bones lose minerals and strength as we age. Women ages 4 and over should discuss this with their caregivers, as  should women after menopause who have other risk factors.  Lab tests are generally done as part of preventative care to screen for anemia and blood disorders, to screen for problems with the kidneys and liver, to screen for bladder problems, to check blood sugar, and to check your cholesterol level.  Preventative services generally include counseling about diet, exercise, avoiding tobacco, drugs, excessive alcohol consumption, and sexually transmitted infections.    GENERAL RECOMMENDATIONS FOR GOOD HEALTH:  Healthy diet:  Eat a variety of foods, including fruit, vegetables, animal or vegetable protein, such as meat, fish, chicken, and eggs, or beans, lentils, tofu, and grains, such as rice.  Drink plenty of water daily.  Decrease saturated fat in the diet, avoid lots of red meat, processed foods, sweets, fast foods, and fried foods.  Exercise:  Aerobic exercise helps maintain good heart health. At least 30-40 minutes of moderate-intensity exercise is recommended. For example, a brisk walk that increases your heart rate and breathing. This should be done on most days of the week.   Find a type of exercise or a variety of exercises that you enjoy so that it becomes a part of your daily life.  Examples are running, walking, swimming, water aerobics, and biking.  For motivation and support, explore group exercise such as aerobic class, spin class, Zumba, Yoga,or  martial arts, etc.    Set exercise goals for yourself, such as a certain weight goal, walk or run in a race such as  a 5k walk/run.  Speak to your primary care provider about exercise goals.  Disease prevention:  If you smoke or chew tobacco, find out from your caregiver how to quit. It can literally save your life, no matter how long you have been a tobacco user. If you do not use tobacco, never begin.   Maintain a healthy diet and normal weight. Increased weight leads to problems with blood pressure and diabetes.   The Body Mass  Index or BMI is a way of measuring how much of your body is fat. Having a BMI above 27 increases the risk of heart disease, diabetes, hypertension, stroke and other problems related to obesity. Your caregiver can help determine your BMI and based on it develop an exercise and dietary program to help you achieve or maintain this important measurement at a healthful level.  High blood pressure causes heart and blood vessel problems.  Persistent high blood pressure should be treated with medicine if weight loss and exercise do not work.   Fat and cholesterol leaves deposits in your arteries that can block them. This causes heart disease and vessel disease elsewhere in your body.  If your cholesterol is found to be high, or if you have heart disease or certain other medical conditions, then you may need to have your cholesterol monitored frequently and be treated with medication.   Ask if you should have a cardiac stress test if your history suggests this. A stress test is a test done on a treadmill that looks for heart disease. This test can find disease prior to there being a problem.  Menopause can be associated with physical symptoms and risks. Hormone replacement therapy is available to decrease these. You should talk to your caregiver about whether starting or continuing to take hormones is right for you.   Osteoporosis is a disease in which the bones lose minerals and strength as we age. This can result in serious bone fractures. Risk of osteoporosis can be identified using a bone density scan. Women ages 52 and over should discuss this with their caregivers, as should women after menopause who have other risk factors. Ask your caregiver whether you should be taking a calcium supplement and Vitamin D, to reduce the rate of osteoporosis.   Avoid drinking alcohol in excess (more than two drinks per day).  Avoid use of street drugs. Do not share needles with anyone. Ask for professional help if you need  assistance or instructions on stopping the use of alcohol, cigarettes, and/or drugs.  Brush your teeth twice a day with fluoride toothpaste, and floss once a day. Good oral hygiene prevents tooth decay and gum disease. The problems can be painful, unattractive, and can cause other health problems. Visit your dentist for a routine oral and dental check up and preventive care every 6-12 months.   Look at your skin regularly.  Use a mirror to look at your back. Notify your caregivers of changes in moles, especially if there are changes in shapes, colors, a size larger than a pencil eraser, an irregular border, or development of new moles.  Safety:  Use seatbelts 100% of the time, whether driving or as a passenger.  Use safety devices such as hearing protection if you work in environments with loud noise or significant background noise.  Use safety glasses when doing any work that could send debris in to the eyes.  Use a helmet if you ride a bike or motorcycle.  Use appropriate safety gear for contact  sports.  Talk to your caregiver about gun safety.  Use sunscreen with a SPF (or skin protection factor) of 15 or greater.  Lighter skinned people are at a greater risk of skin cancer. Don't forget to also wear sunglasses in order to protect your eyes from too much damaging sunlight. Damaging sunlight can accelerate cataract formation.   Practice safe sex. Use condoms. Condoms are used for birth control and to help reduce the spread of sexually transmitted infections (or STIs).  Some of the STIs are gonorrhea (the clap), chlamydia, syphilis, trichomonas, herpes, HPV (human papilloma virus) and HIV (human immunodeficiency virus) which causes AIDS. The herpes, HIV and HPV are viral illnesses that have no cure. These can result in disability, cancer and death.   Keep carbon monoxide and smoke detectors in your home functioning at all times. Change the batteries every 6 months or use a model that plugs into the  wall.   Vaccinations:  Stay up to date with your tetanus shots and other required immunizations. You should have a booster for tetanus every 10 years. Be sure to get your flu shot every year, since 5%-20% of the [place] population comes down with the flu. The flu vaccine changes each year, so being vaccinated once is not enough. Get your shot in the fall, before the flu season peaks.   Other vaccines to consider:  Human Papilloma Virus or HPV causes cancer of the cervix, and other infections that can be transmitted from person to person. There is a vaccine for HPV, and females should get immunized between the ages of 26 and 90. It requires a series of 3 shots.   Pneumococcal vaccine to protect against certain types of pneumonia.  This is normally recommended for adults age 75 or older.  However, adults younger than 61 years old with certain underlying conditions such as diabetes, heart or lung disease should also receive the vaccine.  Shingles vaccine to protect against Varicella Zoster if you are older than age 27, or younger than 61 years old with certain underlying illness.  Hepatitis A vaccine to protect against a form of infection of the liver by a virus acquired from food.  Hepatitis B vaccine to protect against a form of infection of the liver by a virus acquired from blood or body fluids, particularly if you work in health care.  If you plan to travel internationally, check with your local health department for specific vaccination recommendations.  Cancer Screening:  Breast cancer screening is essential to preventive care for women. All women age 56 and older should perform a breast self-exam every month. At age 67 and older, women should have their caregiver complete a breast exam each year. Women at ages 12 and older should have a mammogram (x-ray film) of the breasts. Your caregiver can discuss how often you need mammograms.    Cervical cancer screening includes taking a Pap smear  (sample of cells examined under a microscope) from the cervix (end of the uterus). It also includes testing for HPV (Human Papilloma Virus, which can cause cervical cancer). Screening and a pelvic exam should begin at age 31, or 3 years after a woman becomes sexually active. Screening should occur every year, with a Pap smear but no HPV testing, up to age 41. After age 32, you should have a Pap smear every 3 years with HPV testing, if no HPV was found previously.   Most routine colon cancer screening begins at the age of 60. On a yearly  basis, doctors may provide special easy to use take-home tests to check for hidden blood in the stool. Sigmoidoscopy or colonoscopy can detect the earliest forms of colon cancer and is life saving. These tests use a small camera at the end of a tube to directly examine the colon. Speak to your caregiver about this at age 14, when routine screening begins (and is repeated every 5 years unless early forms of pre-cancerous polyps or small growths are found).

## 2015-12-02 NOTE — Progress Notes (Signed)
Subjective:    Patient ID: Becky Lara, female    DOB: 30-Jan-1956, 60 y.o.   MRN: [id number]  HPI Chief Complaint  Patient presents with  . fasting cpe    fasting cpe and pap.. having some leakage when she laughs   She is here for complete physical exam and fasting blood work.  She is currently taking an antibiotic for skin breakdown to her abdomen that is result of chronic topical steroid use for intertrigo. She has an appointment in March with Dr. Allyson Sabal for the same issue. She has not been using the topical steroids as we discussed. She complains of urinary leakage with coughing and laughing for approximately one year. She is not having to wear a pad but states she probably should.  She has had 2 vaginal deliveries in her lifetime.   She denies having any reflux symptoms. Not taking medications for this.   Last physical exam was approximately 2 years ago. She has a history of high blood pressure and is taking daily medication for this. Reports good medication compliance.  Her diet is non-discretionary.  She has not been exercising as of yet.  She works shifts. She switches from nights 2 days on a weekly basis. His had difficulty sleeping due to this and has been taking Aleve PM. At her last appointment we discussed that she was taking too much Aleve. She has not taken any Aleve in past 5 days.    Last eye exam: last year, wears glasses. Goes yearly Dentist: overdue, goes to [facility].  Last pap smear: 5 years ago.  Mammogram: 10/2014 Colonoscopy: never  Immunizations: flu shot today, needs Tdap, shingles- never Has had pneumonia shot.   She smokes marijuana daily. Drinks at least 1 bottle of wine per week and also drinks 2-3 glasses of vodka per week. Denies other drug use.    Review of Systems Review of Systems Constitutional: -fever, -chills, -sweats, -unexpected weight change,-fatigue ENT: -runny nose, -ear pain, -sore throat Cardiology:  -chest pain,  -palpitations, -edema Respiratory: -cough, -shortness of breath, -wheezing Gastroenterology: -abdominal pain, -nausea, -vomiting, -diarrhea, -constipation  Hematology: -bleeding or bruising problems Musculoskeletal: -arthralgias, -myalgias, -joint swelling, -back pain Ophthalmology: -vision changes Urology: -dysuria, -difficulty urinating, -hematuria, -urinary frequency, +urgency Neurology: -headache, -weakness, -tingling, -numbness       Objective:   Physical Exam BP 124/82 mmHg  Pulse 64  Ht 5' 5.25" (1.657 m)  Wt 228 lb 3.2 oz (103.511 kg)  BMI 37.70 kg/m2  LMP 04/26/2008  General Appearance:    Alert, cooperative, no distress, appears stated age  Head:    Normocephalic, without obvious abnormality, atraumatic  Eyes:    PERRL, conjunctiva/corneas clear, EOM's intact, fundi    benign  Ears:    Normal TM's and external ear canals  Nose:   Nares normal, mucosa normal, no drainage or sinus   tenderness  Throat:   Lips, mucosa, and tongue normal; teeth and gums normal  Neck:   Supple, no lymphadenopathy;  thyroid:  no   enlargement/tenderness/nodules; no carotid   bruit or JVD  Back:    Spine nontender, no curvature, ROM normal, no CVA     tenderness  Lungs:     Clear to auscultation bilaterally without wheezes, rales or     ronchi; respirations unlabored  Chest Wall:    No tenderness or deformity   Heart:    Regular rate and rhythm, S1 and S2 normal, no murmur, rub   or gallop  Breast  Exam:    No tenderness, masses, or nipple discharge or inversion.      No axillary lymphadenopathy  Abdomen:     Soft, non-tender, nondistended, normoactive bowel sounds,    no masses, no hepatosplenomegaly. Lower abdominal skin continues to be discolored and appears thin in discreet areas due to chronic topical steroid use. Small area of skin breakdown to left lower abdomen without signs of infection.   Genitalia:    Normal external genitalia without lesions.  BUS and vagina normal; cervix without  lesions, or cervical motion tenderness. No abnormal vaginal discharge.  Uterus and adnexa not enlarged, nontender, no masses.  Pap performed  Rectal:    Not performed.   Extremities:   No clubbing, cyanosis or edema  Pulses:   2+ and symmetric all extremities  Skin:   Skin color, texture, turgor normal, no rashes or lesions  Lymph nodes:   Cervical, supraclavicular, and axillary nodes normal  Neurologic:   CNII-XII intact, normal strength, sensation and gait; reflexes 2+ and symmetric throughout          Psych:   Normal mood, affect, hygiene and grooming.    Urinalysis dipstick: negative       Assessment & Plan:  Essential hypertension  Tobacco abuse  Stress incontinence  Screening for cervical cancer  Need for Tdap vaccination  Need for prophylactic vaccination and inoculation against influenza  Screening for breast cancer  Screening for colon cancer  Does not her blood pressure is within goal today. Congratulated her on taking her medication. Recommend that she watch her sodium intake and talked about the [employer]. Also recommend that she start being physically active and getting a minimum of 150 minutes per week of physical activity. Recommend she start slow and gradually increase exercise. Discussed that even though she is not smoking cigarettes that marijuana is still harmful to her lungs. Recommend that she reduce alcohol and marijuana intake. Discussed stress incontinence and causes such as weakened pelvic floor. Will further discuss options for medication or possible rehabilitation for this. Discussed Kegel exercises today.  She is watching Aleve intake since our last appointment, she had previously been taking more than recommended. Discussed possible side effects such as kidney damage or GI upset.  Recommend that she check with her insurance company and make sure shingles vaccination is covered well. She will call and let us know if she would like to have Zostavax. Tdap  given today. Flu shot given today Will refer for mammogram and colonoscopy. Will need to follow up on stress incontinence.

## 2015-12-03 LAB — LIPID PANEL
Cholesterol: 206 mg/dL — ABNORMAL HIGH (ref 125–200)
HDL: 85 mg/dL
LDL Cholesterol: 107 mg/dL
Total CHOL/HDL Ratio: 2.4 ratio
Triglycerides: 68 mg/dL
VLDL: 14 mg/dL

## 2015-12-03 LAB — POCT URINALYSIS DIPSTICK
Bilirubin, UA: NEGATIVE
Blood, UA: NEGATIVE
GLUCOSE UA: NEGATIVE
Ketones, UA: NEGATIVE
LEUKOCYTES UA: NEGATIVE
NITRITE UA: NEGATIVE
PH UA: 6
Spec Grav, UA: >=1.03
Urobilinogen, UA: NEGATIVE

## 2015-12-04 LAB — CYTOLOGY - PAP

## 2015-12-09 ENCOUNTER — Other Ambulatory Visit: Payer: Self-pay | Admitting: Family Medicine

## 2015-12-09 ENCOUNTER — Ambulatory Visit
Admission: RE | Admit: 2015-12-09 | Discharge: 2015-12-09 | Disposition: A | Discharge status: Home / Self Care | Payer: BLUE CROSS/BLUE SHIELD | Source: Ambulatory Visit | Attending: Family Medicine | Admitting: Family Medicine

## 2015-12-09 DIAGNOSIS — Z1239 Encounter for other screening for malignant neoplasm of breast: Secondary | ICD-10-CM

## 2016-01-12 ENCOUNTER — Ambulatory Visit (AMBULATORY_SURGERY_CENTER): Payer: Self-pay | Admitting: *Deleted

## 2016-01-12 VITALS — Ht 65.5 in | Wt 232.0 lb

## 2016-01-12 DIAGNOSIS — Z1211 Encounter for screening for malignant neoplasm of colon: Secondary | ICD-10-CM

## 2016-01-12 MED ORDER — NA SULFATE-K SULFATE-MG SULF 17.5-3.13-1.6 GM/177ML PO SOLN: 1.0000 | Freq: Once | ORAL | Status: DC

## 2016-01-12 NOTE — Progress Notes (Signed)
Denies allergies to eggs or soy products. Denies complications with sedation or anesthesia. Denies O2 use. Denies use of diet or weight loss medications.  Emmi instructions given for colonoscopy.  

## 2016-01-26 ENCOUNTER — Encounter: Payer: Self-pay | Admitting: Internal Medicine

## 2016-01-26 ENCOUNTER — Ambulatory Visit (AMBULATORY_SURGERY_CENTER): Payer: BLUE CROSS/BLUE SHIELD | Admitting: Internal Medicine

## 2016-01-26 VITALS — BP 106/70 | HR 69 | Temp 97.3°F | Resp 17 | Ht 65.5 in | Wt 232.0 lb

## 2016-01-26 DIAGNOSIS — D122 Benign neoplasm of ascending colon: Secondary | ICD-10-CM | POA: Diagnosis not present

## 2016-01-26 DIAGNOSIS — Z1211 Encounter for screening for malignant neoplasm of colon: Secondary | ICD-10-CM | POA: Diagnosis not present

## 2016-01-26 MED ORDER — SODIUM CHLORIDE 0.9 % IV SOLN: 500.0000 mL | INTRAVENOUS | Status: DC

## 2016-01-26 NOTE — Patient Instructions (Signed)
Colon polyp removed today. Handouts given on polyps, and hemorrhoids. Result letter in your mail in 2-3 weeks. Thank you!  YOU HAD AN ENDOSCOPIC PROCEDURE TODAY AT [facility]:   Refer to the procedure report that was given to you for any specific questions about what was found during the examination.  If the procedure report does not answer your questions, please call your gastroenterologist to clarify.  If you requested that your care partner not be given the details of your procedure findings, then the procedure report has been included in a sealed envelope for you to review at your convenience later.  YOU SHOULD EXPECT: Some feelings of bloating in the abdomen. Passage of more gas than usual.  Walking can help get rid of the air that was put into your GI tract during the procedure and reduce the bloating. If you had a lower endoscopy (such as a colonoscopy or flexible sigmoidoscopy) you may notice spotting of blood in your stool or on the toilet paper. If you underwent a bowel prep for your procedure, you may not have a normal bowel movement for a few days.  Please Note:  You might notice some irritation and congestion in your nose or some drainage.  This is from the oxygen used during your procedure.  There is no need for concern and it should clear up in a day or so.  SYMPTOMS TO REPORT IMMEDIATELY:   Following lower endoscopy (colonoscopy or flexible sigmoidoscopy):  Excessive amounts of blood in the stool  Significant tenderness or worsening of abdominal pains  Swelling of the abdomen that is new, acute  Fever of 100F or higher   For urgent or emergent issues, a gastroenterologist can be reached at any hour by calling [phone].   DIET: Your first meal following the procedure should be a small meal and then it is ok to progress to your normal diet. Heavy or fried foods are harder to digest and may make you feel nauseous or bloated.  Likewise, meals heavy in dairy  and vegetables can increase bloating.  Drink plenty of fluids but you should avoid alcoholic beverages for 24 hours.  ACTIVITY:  You should plan to take it easy for the rest of today and you should NOT DRIVE or use heavy machinery until tomorrow (because of the sedation medicines used during the test).    FOLLOW UP: Our staff will call the number listed on your records the next business day following your procedure to check on you and address any questions or concerns that you may have regarding the information given to you following your procedure. If we do not reach you, we will leave a message.  However, if you are feeling well and you are not experiencing any problems, there is no need to return our call.  We will assume that you have returned to your regular daily activities without incident.  If any biopsies were taken you will be contacted by phone or by letter within the next 1-3 weeks.  Please call us at [phone] if you have not heard about the biopsies in 3 weeks.    SIGNATURES/CONFIDENTIALITY: You and/or your care partner have signed paperwork which will be entered into your electronic medical record.  These signatures attest to the fact that that the information above on your After Visit Summary has been reviewed and is understood.  Full responsibility of the confidentiality of this discharge information lies with you and/or your care-partner.

## 2016-01-26 NOTE — Op Note (Signed)
[facility] Patient Name: Becky Lara Procedure Date: 01/26/2016 9:11 AM MRN: [id number] Endoscopist: Docia Chuck. Henrene Pastor , MD Age: 60 Referring MD:  Date of Birth: 08-Feb-1956 Gender: Female Procedure:                Colonoscopy with snare polypectomy -1 Indications:              Screening for colorectal malignant neoplasm Medicines:                Monitored Anesthesia Care Procedure:                Pre-Anesthesia Assessment:                           - Prior to the procedure, a History and Physical                            was performed, and patient medications and                            allergies were reviewed. The patient's tolerance of                            previous anesthesia was also reviewed. The risks                            and benefits of the procedure and the sedation                            options and risks were discussed with the patient.                            All questions were answered, and informed consent                            was obtained. Prior Anticoagulants: The patient has                            taken no previous anticoagulant or antiplatelet                            agents. ASA Grade Assessment: II - A patient with                            mild systemic disease. After reviewing the risks                            and benefits, the patient was deemed in                            satisfactory condition to undergo the procedure.                           After obtaining informed consent, the colonoscope  was passed under direct vision. Throughout the                            procedure, the patient's blood pressure, pulse, and                            oxygen saturations were monitored continuously. The                            Model CF-HQ190L [id number]) scope was introduced                            through the anus and advanced to the the cecum,                            identified by  appendiceal orifice and ileocecal                            valve. The colonoscopy was somewhat difficult due                            to the patient's body habitus. The patient                            tolerated the procedure well. The quality of the                            bowel preparation was excellent. The bowel                            preparation used was SUPREP. The ileocecal valve,                            appendiceal orifice, and rectum were photographed. Scope In: 9:19:04 AM Scope Out: 9:43:59 AM Scope Withdrawal Time: 0 hours 14 minutes 14 seconds  Total Procedure Duration: 0 hours 24 minutes 55 seconds  Findings:      The digital rectal exam was normal.      A 2 mm polyp was found in the ascending colon. The polyp was removed       with a cold snare. Resection and retrieval were complete.      Internal hemorrhoids were found during retroflexion.      The exam was otherwise without abnormality on direct and retroflexion       views. Complications:            No immediate complications. Estimated Blood Loss:     Estimated blood loss: none. Impression:               - One 2 mm polyp in the ascending colon, removed                            with a cold snare. Resected and retrieved.                           - Internal  hemorrhoids.                           - The examination was otherwise normal on direct                            and retroflexion views. Recommendation:           - Patient has a contact number available for                            emergencies. The signs and symptoms of potential                            delayed complications were discussed with the                            patient. Return to normal activities tomorrow.                            Written discharge instructions were provided to the                            patient.                           - Resume previous diet.                           - Continue present  medications.                           - Await pathology results.                           - Repeat colonoscopy in 5-10 years for surveillance. Procedure Code(s):        --- Professional ---                           [phone], Colonoscopy, flexible; with removal of                            tumor(s), polyp(s), or other lesion(s) by snare                            technique CPT copyright 2016 American Medical Association. All rights reserved. Docia Chuck. Henrene Pastor, MD 01/26/2016 9:50:37 AM This report has been signed electronically. Number of Addenda: 0 CC Letter to:             Harland Dingwall NP Referring MD:      Laurian Brim. [employer]

## 2016-01-26 NOTE — Progress Notes (Signed)
To recovery, report to Milus Mallick, RN, VSS

## 2016-01-26 NOTE — Progress Notes (Signed)
Called to room to assist during endoscopic procedure.  Patient ID and intended procedure confirmed with present staff. Received instructions for my participation in the procedure from the performing physician.  

## 2016-01-27 ENCOUNTER — Telehealth: Payer: Self-pay | Admitting: *Deleted

## 2016-01-27 NOTE — Telephone Encounter (Signed)
No answer, left message to call if questions or concerns. 

## 2016-02-02 ENCOUNTER — Encounter: Payer: Self-pay | Admitting: Internal Medicine

## 2016-02-16 ENCOUNTER — Encounter: Payer: Self-pay | Admitting: Family Medicine

## 2016-02-24 ENCOUNTER — Encounter: Payer: Self-pay | Admitting: Family Medicine

## 2016-02-24 ENCOUNTER — Telehealth: Payer: Self-pay | Admitting: *Deleted

## 2016-02-24 ENCOUNTER — Ambulatory Visit (INDEPENDENT_AMBULATORY_CARE_PROVIDER_SITE_OTHER): Payer: BLUE CROSS/BLUE SHIELD | Admitting: Family Medicine

## 2016-02-24 VITALS — BP 122/80 | HR 68 | Wt 232.0 lb

## 2016-02-24 DIAGNOSIS — L609 Nail disorder, unspecified: Secondary | ICD-10-CM

## 2016-02-24 DIAGNOSIS — I10 Essential (primary) hypertension: Secondary | ICD-10-CM

## 2016-02-24 DIAGNOSIS — L608 Other nail disorders: Secondary | ICD-10-CM

## 2016-02-24 DIAGNOSIS — E669 Obesity, unspecified: Secondary | ICD-10-CM | POA: Diagnosis not present

## 2016-02-24 DIAGNOSIS — K219 Gastro-esophageal reflux disease without esophagitis: Secondary | ICD-10-CM | POA: Diagnosis not present

## 2016-02-24 MED ORDER — AMLODIPINE BESYLATE 10 MG PO TABS: 10.0000 mg | ORAL_TABLET | Freq: Every day | ORAL | Status: DC

## 2016-02-24 MED ORDER — HYDROCHLOROTHIAZIDE 12.5 MG PO CAPS: 12.5000 mg | ORAL_CAPSULE | Freq: Every day | ORAL | Status: DC

## 2016-02-24 MED ORDER — ESOMEPRAZOLE MAGNESIUM 20 MG PO CPDR: 20.0000 mg | DELAYED_RELEASE_CAPSULE | Freq: Every day | ORAL | Status: DC

## 2016-02-24 MED ORDER — SIMVASTATIN 10 MG PO TABS: 10.0000 mg | ORAL_TABLET | Freq: Every day | ORAL | Status: DC

## 2016-02-24 NOTE — Telephone Encounter (Signed)
Ok. Please let her know that I am going to start her on a medication for this to lower her risk for heart disease and stroke. [facility] Thanks.

## 2016-02-24 NOTE — Progress Notes (Signed)
Subjective:    Patient ID: Becky Lara, female    DOB: 07-08-1956, 60 y.o.   MRN: [id number]  HPI Chief Complaint  Patient presents with  . follow-up    follow-up on last visit. needs refill on bp med. wants to have her toe looked at   She is here for follow-up on hypertension. Has an acute complaint of toenail to right great toe being thick and started to cause her pain when she is standing on her foot while working 12 hour shifts and when wearing certain shoes.  States she ran out of blood pressure medication 10 days ago, she did not call us to let us know. States she knows she can call but she was just being "lazy".   States she stopped smoking marijuana 2 months ago and does not plan to start back.  Also reports she cut back on daily alcohol- no alcohol in 2 weeks.  Complains of having reflux on and off. Is not aware of triggers foods. Has not been taking medication for this.  Had colonoscopy since last visit- benign polyps and follow up in 5 years. Also had mammogram and this was normal.    States she is not exercising but plans to start walking.   Reports being on a statin in past but had issue with the medication she was taking, states it caused her to have constipation, so she stopped taking it. She cannot recall the name of the medication.    Review of Systems Pertinent positives and negatives in the history of present illness.     Objective:   Physical Exam BP 122/80 mmHg  Pulse 68  Wt 232 lb (105.235 kg)  LMP 04/26/2008  Alert and in no distress. Cardiac exam shows a regular sinus rhythm without murmurs or gallops. Lungs are clear to auscultation. Right foot with great toenail discolored, thickened and deformed, otherwise right foot exam normal.    ASCVD 10-year risk 10.1%    Assessment & Plan:  Essential hypertension - Plan: amLODipine (NORVASC) 10 MG tablet, amLODipine (NORVASC) 10 MG tablet, hydrochlorothiazide (MICROZIDE) 12.5 MG capsule, DISCONTINUED:  amLODipine (NORVASC) 10 MG tablet, DISCONTINUED: hydrochlorothiazide (MICROZIDE) 12.5 MG capsule, DISCONTINUED: hydrochlorothiazide (MICROZIDE) 12.5 MG capsule  Gastroesophageal reflux disease, esophagitis presence not specified - Plan: esomeprazole (NEXIUM) 20 MG capsule  Obesity (BMI 30-39.9)  Toenail deformity - Plan: Ambulatory referral to Podiatry  Discussed that I do not want her running out of her medication in the future and that she just needs to call us and let us know. We are refilling medications for hypertension and sending in a month supply to her local pharmacy and then [facility]. Discussed GERD management and provided written instructions. Nexium samples provided and she will let me know if this is helping symptoms. Congratulated her on stopping smoking and reducing alcohol intake. Discussed that this will also help with her reflux symptoms. She is aware that her BMI places her in the obese category. Recommend that she start being physically active and getting at least 150 minutes of activity per week. Discussed healthy and well balanced diet, cutting back on sugars and carbohydrates. Discussed that she is at risk for cardiovascular disease and recommend that she start taking a statin. She will call her previous PCP and find out which statin she was taking and call and let us know since she thinks this caused her constipation in the past and I will prescribe a different medication. Referral made to  podiatrist for toenail evaluation and treatment.  Her calculated 10 year ASCVD risk is 10.1 % and I Plan to start her on a statin medication for primary prevention. She will  return for fasting lipids in approximately 6 weeks after starting the medication. She will need to have fasting lipids and CMP.  Plan to have her follow-up with me in 8-10 weeks or sooner if needed for hypertension and GERD.

## 2016-02-24 NOTE — Telephone Encounter (Signed)
Patient advised.

## 2016-02-24 NOTE — Patient Instructions (Addendum)
Dark walking for exercise. Try to get at least 150 minutes of physical activity per week. Watch your calories and carbohydrates.  I am referring you to a podiatrist for your toenail.  Find out which statin, cholesterol medicine, you were taking and give me a call back. You need to be on a statin to help control your risk for heart disease. I would like for you to return in approximately 4 weeks after starting the cholesterol medication for a fasting lipid panel. Call and schedule a lab visit for this Then follow-up with me in approximately 8 -10weeks. Continue taking you daily blood pressure medication.  Try taking daily Nexium and take it 30 minutes before you eat breakfast and see if this helps with reflux. Let me know how this is doing. Also read the information below on GERD.     DASH Eating Plan DASH stands for "Dietary Approaches to Stop Hypertension." The DASH eating plan is a healthy eating plan that has been shown to reduce high blood pressure (hypertension). Additional health benefits may include reducing the risk of type 2 diabetes mellitus, heart disease, and stroke. The DASH eating plan may also help with weight loss. WHAT DO I NEED TO KNOW ABOUT THE DASH EATING PLAN? For the DASH eating plan, you will follow these general guidelines:  Choose foods with a percent daily value for sodium of less than 5% (as listed on the food label).  Use salt-free seasonings or herbs instead of table salt or sea salt.  Check with your health care provider or pharmacist before using salt substitutes.  Eat lower-sodium products, often labeled as "lower sodium" or "no salt added."  Eat fresh foods.  Eat more vegetables, fruits, and low-fat dairy products.  Choose whole grains. Look for the word "whole" as the first word in the ingredient list.  Choose fish and skinless chicken or [place] more often than red meat. Limit fish, poultry, and meat to 6 oz (170 g) each day.  Limit sweets, desserts,  sugars, and sugary drinks.  Choose heart-healthy fats.  Limit cheese to 1 oz (28 g) per day.  Eat more home-cooked food and less restaurant, buffet, and fast food.  Limit fried foods.  Cook foods using methods other than frying.  Limit canned vegetables. If you do use them, rinse them well to decrease the sodium.  When eating at a restaurant, ask that your food be prepared with less salt, or no salt if possible. WHAT FOODS CAN I EAT? Seek help from a dietitian for individual calorie needs. Grains Whole grain or whole wheat bread. Brown rice. Whole grain or whole wheat pasta. Quinoa, bulgur, and whole grain cereals. Low-sodium cereals. Corn or whole wheat flour tortillas. Whole grain cornbread. Whole grain crackers. Low-sodium crackers. Vegetables Fresh or frozen vegetables (raw, steamed, roasted, or grilled). Low-sodium or reduced-sodium tomato and vegetable juices. Low-sodium or reduced-sodium tomato sauce and paste. Low-sodium or reduced-sodium canned vegetables.  Fruits All fresh, canned (in natural juice), or frozen fruits. Meat and Other Protein Products Ground beef (85% or leaner), grass-fed beef, or beef trimmed of fat. Skinless chicken or [place]. Ground chicken or [place]. Pork trimmed of fat. All fish and seafood. Eggs. Dried beans, peas, or lentils. Unsalted nuts and seeds. Unsalted canned beans. Dairy Low-fat dairy products, such as skim or 1% milk, 2% or reduced-fat cheeses, low-fat ricotta or cottage cheese, or plain low-fat yogurt. Low-sodium or reduced-sodium cheeses. Fats and Oils Tub margarines without trans fats. Light or reduced-fat mayonnaise and salad  dressings (reduced sodium). Avocado. Safflower, olive, or canola oils. Natural peanut or almond butter. Other Unsalted popcorn and pretzels. The items listed above may not be a complete list of recommended foods or beverages. Contact your dietitian for more options. WHAT FOODS ARE NOT RECOMMENDED? Grains White  bread. White pasta. White rice. Refined cornbread. Bagels and croissants. Crackers that contain trans fat. Vegetables Creamed or fried vegetables. Vegetables in a cheese sauce. Regular canned vegetables. Regular canned tomato sauce and paste. Regular tomato and vegetable juices. Fruits Dried fruits. Canned fruit in light or heavy syrup. Fruit juice. Meat and Other Protein Products Fatty cuts of meat. Ribs, chicken wings, bacon, sausage, bologna, salami, chitterlings, fatback, hot dogs, bratwurst, and packaged luncheon meats. Salted nuts and seeds. Canned beans with salt. Dairy Whole or 2% milk, cream, half-and-half, and cream cheese. Whole-fat or sweetened yogurt. Full-fat cheeses or blue cheese. Nondairy creamers and whipped toppings. Processed cheese, cheese spreads, or cheese curds. Condiments Onion and garlic salt, seasoned salt, table salt, and sea salt. Canned and packaged gravies. Worcestershire sauce. Tartar sauce. Barbecue sauce. Teriyaki sauce. Soy sauce, including reduced sodium. Steak sauce. Fish sauce. Oyster sauce. Cocktail sauce. Horseradish. Ketchup and mustard. Meat flavorings and tenderizers. Bouillon cubes. Hot sauce. Tabasco sauce. Marinades. Taco seasonings. Relishes. Fats and Oils Butter, stick margarine, lard, shortening, ghee, and bacon fat. Coconut, palm kernel, or palm oils. Regular salad dressings. Other Pickles and olives. Salted popcorn and pretzels. The items listed above may not be a complete list of foods and beverages to avoid. Contact your dietitian for more information. WHERE CAN I FIND MORE INFORMATION? National Heart, Lung, and Blood Institute: travelstabloid.com   This information is not intended to replace advice given to you by your health care provider. Make sure you discuss any questions you have with your health care provider.   Document Released: 09/30/2011 Document Revised: 11/01/2014 Document Reviewed:  08/15/2013 Elsevier Interactive Patient Education 2016 [place].  Gastroesophageal Reflux Disease, Adult Normally, food travels down the esophagus and stays in the stomach to be digested. However, when a person has gastroesophageal reflux disease (GERD), food and stomach acid move back up into the esophagus. When this happens, the esophagus becomes sore and inflamed. Over time, GERD can create small holes (ulcers) in the lining of the esophagus.  CAUSES This condition is caused by a problem with the muscle between the esophagus and the stomach (lower esophageal sphincter, or LES). Normally, the LES muscle closes after food passes through the esophagus to the stomach. When the LES is weakened or abnormal, it does not close properly, and that allows food and stomach acid to go back up into the esophagus. The LES can be weakened by certain dietary substances, medicines, and medical conditions, including:  Tobacco use.  Pregnancy.  Having a hiatal hernia.  Heavy alcohol use.  Certain foods and beverages, such as coffee, chocolate, onions, and peppermint. RISK FACTORS This condition is more likely to develop in:  People who have an increased body weight.  People who have connective tissue disorders.  People who use NSAID medicines. SYMPTOMS Symptoms of this condition include:  Heartburn.  Difficult or painful swallowing.  The feeling of having a lump in the throat.  Abitter taste in the mouth.  Bad breath.  Having a large amount of saliva.  Having an upset or bloated stomach.  Belching.  Chest pain.  Shortness of breath or wheezing.  Ongoing (chronic) cough or a night-time cough.  Wearing away of tooth enamel.  Weight  loss. Different conditions can cause chest pain. Make sure to see your health care provider if you experience chest pain. DIAGNOSIS Your health care provider will take a medical history and perform a physical exam. To determine if you have mild or  severe GERD, your health care provider may also monitor how you respond to treatment. You may also have other tests, including:  An endoscopy toexamine your stomach and esophagus with a small camera.  A test thatmeasures the acidity level in your esophagus.  A test thatmeasures how much pressure is on your esophagus.  A barium swallow or modified barium swallow to show the shape, size, and functioning of your esophagus. TREATMENT The goal of treatment is to help relieve your symptoms and to prevent complications. Treatment for this condition may vary depending on how severe your symptoms are. Your health care provider may recommend:  Changes to your diet.  Medicine.  Surgery. HOME CARE INSTRUCTIONS Diet  Follow a diet as recommended by your health care provider. This may involve avoiding foods and drinks such as:  Coffee and tea (with or without caffeine).  Drinks that containalcohol.  Energy drinks and sports drinks.  Carbonated drinks or sodas.  Chocolate and cocoa.  Peppermint and mint flavorings.  Garlic and onions.  Horseradish.  Spicy and acidic foods, including peppers, chili powder, curry powder, vinegar, hot sauces, and barbecue sauce.  Citrus fruit juices and citrus fruits, such as oranges, lemons, and limes.  Tomato-based foods, such as red sauce, chili, salsa, and pizza with red sauce.  Fried and fatty foods, such as donuts, french fries, potato chips, and high-fat dressings.  High-fat meats, such as hot dogs and fatty cuts of red and white meats, such as rib eye steak, sausage, ham, and bacon.  High-fat dairy items, such as whole milk, butter, and cream cheese.  Eat small, frequent meals instead of large meals.  Avoid drinking large amounts of liquid with your meals.  Avoid eating meals during the 2-3 hours before bedtime.  Avoid lying down right after you eat.  Do not exercise right after you eat. General Instructions  Pay attention to  any changes in your symptoms.  Take over-the-counter and prescription medicines only as told by your health care provider. Do not take aspirin, ibuprofen, or other NSAIDs unless your health care provider told you to do so.  Do not use any tobacco products, including cigarettes, chewing tobacco, and e-cigarettes. If you need help quitting, ask your health care provider.  Wear loose-fitting clothing. Do not wear anything tight around your waist that causes pressure on your abdomen.  Raise (elevate) the head of your bed 6 inches (15cm).  Try to reduce your stress, such as with yoga or meditation. If you need help reducing stress, ask your health care provider.  If you are overweight, reduce your weight to an amount that is healthy for you. Ask your health care provider for guidance about a safe weight loss goal.  Keep all follow-up visits as told by your health care provider. This is important. SEEK MEDICAL CARE IF:  You have new symptoms.  You have unexplained weight loss.  You have difficulty swallowing, or it hurts to swallow.  You have wheezing or a persistent cough.  Your symptoms do not improve with treatment.  You have a hoarse voice. SEEK IMMEDIATE MEDICAL CARE IF:  You have pain in your arms, neck, jaw, teeth, or back.  You feel sweaty, dizzy, or light-headed.  You have chest pain or  shortness of breath.  You vomit and your vomit looks like blood or coffee grounds.  You faint.  Your stool is bloody or black.  You cannot swallow, drink, or eat.   This information is not intended to replace advice given to you by your health care provider. Make sure you discuss any questions you have with your health care provider.   Document Released: 07/21/2005 Document Revised: 07/02/2015 Document Reviewed: 02/05/2015 Elsevier Interactive Patient Education [employer].

## 2016-02-24 NOTE — Telephone Encounter (Signed)
Patient called back and she states that she spoke with her former doctor and they did not any record of her taking a cholesterol medication.

## 2016-03-03 ENCOUNTER — Ambulatory Visit: Payer: BLUE CROSS/BLUE SHIELD | Admitting: Family Medicine

## 2016-03-08 ENCOUNTER — Ambulatory Visit (INDEPENDENT_AMBULATORY_CARE_PROVIDER_SITE_OTHER): Payer: BLUE CROSS/BLUE SHIELD | Admitting: Podiatry

## 2016-03-08 ENCOUNTER — Encounter: Payer: Self-pay | Admitting: Podiatry

## 2016-03-08 VITALS — BP 137/85 | HR 74 | Resp 16

## 2016-03-08 DIAGNOSIS — B351 Tinea unguium: Secondary | ICD-10-CM

## 2016-03-08 DIAGNOSIS — M779 Enthesopathy, unspecified: Secondary | ICD-10-CM

## 2016-03-08 DIAGNOSIS — L600 Ingrowing nail: Secondary | ICD-10-CM | POA: Diagnosis not present

## 2016-03-08 MED ORDER — TERBINAFINE HCL 250 MG PO TABS: ORAL_TABLET | ORAL | Status: DC

## 2016-03-08 MED ORDER — TERBINAFINE HCL 250 MG PO TABS: 250.0000 mg | ORAL_TABLET | Freq: Every day | ORAL | Status: DC

## 2016-03-08 NOTE — Patient Instructions (Addendum)

## 2016-03-08 NOTE — Progress Notes (Signed)
   Subjective:    Patient ID: Becky Lara, female    DOB: November 04, 1955, 60 y.o.   MRN: [id number]  HPI    Review of Systems  All other systems reviewed and are negative.      Objective:   Physical Exam        Assessment & Plan:

## 2016-03-08 NOTE — Progress Notes (Signed)
Subjective:     Patient ID: Becky Lara, female   DOB: 1956-02-05, 60 y.o.   MRN: [id number]  HPI patient presents stating she's getting a lot of pain in the right big toenail with thickness and damage and several other nails are thick and become a problem over time. She does work 12 hour shifts and steel toe shoes and has trouble wearing her shoe gear comfortably   Review of Systems  All other systems reviewed and are negative.      Objective:   Physical Exam  Constitutional: She is oriented to person, place, and time.  Cardiovascular: Intact distal pulses.   Musculoskeletal: Normal range of motion.  Neurological: She is oriented to person, place, and time.  Skin: Skin is warm.  Nursing note and vitals reviewed.  neurovascular status intact muscle strength adequate range of motion within normal limits with patient found to have a thickened damaged dystrophic hallux nail right that's painful when pressed from a dorsal direction with several other nails having thickness but not to damage that's present in the right big toenail. 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 I do think the nail needs to be removed and I explained the procedure and the fact we need to be permanent. I did explain risk and she wants to procedure understanding it's a permanent type procedure and today I infiltrated the right hallux 60 mg Xylocaine Marcaine mixture remove the hallux nail exposed matrix and applied phenol 5 applications 30 seconds followed by alcohol lavage and sterile dressing. Gave instructions on soaks dispensed surgical shoe and reappoint to recheck

## 2016-03-11 ENCOUNTER — Telehealth: Payer: Self-pay | Admitting: *Deleted

## 2016-03-11 NOTE — Telephone Encounter (Signed)
Pt states had an ingrown toenail procedure Monday and has swelling on the big toe.  Pt states she is doing the Dial soak.  I told pt to switch to the 1/2 C epsom salt in a qt of warm water soaks twice daily for 20 mins each time, switch to Bacitracin ointment and call tomorrow morning with status.  Pt states understanding.

## 2016-03-12 ENCOUNTER — Encounter: Payer: Self-pay | Admitting: Podiatry

## 2016-03-14 ENCOUNTER — Ambulatory Visit (HOSPITAL_COMMUNITY)
Admission: EM | Admit: 2016-03-14 | Discharge: 2016-03-14 | Disposition: A | Discharge status: Home / Self Care | Payer: BLUE CROSS/BLUE SHIELD | Attending: Family Medicine | Admitting: Family Medicine

## 2016-03-14 ENCOUNTER — Encounter (HOSPITAL_COMMUNITY): Payer: Self-pay | Admitting: Emergency Medicine

## 2016-03-14 DIAGNOSIS — L089 Local infection of the skin and subcutaneous tissue, unspecified: Secondary | ICD-10-CM

## 2016-03-14 MED ORDER — CEPHALEXIN 500 MG PO CAPS: 500.0000 mg | ORAL_CAPSULE | Freq: Two times a day (BID) | ORAL | Status: DC

## 2016-03-14 MED ORDER — CEPHALEXIN 500 MG PO CAPS: 500.0000 mg | ORAL_CAPSULE | Freq: Three times a day (TID) | ORAL | Status: DC

## 2016-03-14 NOTE — ED Provider Notes (Signed)
CSN: [id number]     Arrival date & time 03/14/16  1627 History   First MD Initiated Contact with Patient 03/14/16 1802     Chief Complaint  Patient presents with  . Toe Injury   (Consider location/radiation/quality/duration/timing/severity/associated sxs/prior Treatment) The history is provided by the patient. No language interpreter was used.  Right toe pain: Patient was seen by her doctor on 03/08/16 for right first digit toe nail removal  After she sustained an injury to her toe. After the procedure she was instructed to soak her toe in dial soap and then later in epson salt. In the last few days she has been experiencing burning pain, redness and swelling  Of her toe. The swelling is rapidly spreading to her foot. No fever, no recent injury.  Past Medical History  Diagnosis Date  . Hypertension   . Tobacco abuse     pt denies smoking cigarettes currently or as history, marijuana use only  . GERD (gastroesophageal reflux disease)    Past Surgical History  Procedure Laterality Date  . Tubal ligation     Family History  Problem Relation Age of Onset  . Diabetes Mother   . Heart attack Mother   . Heart disease Mother 59    MI  . Hypertension Brother   . Heart disease Father 34    MI  . Colon cancer Neg Hx    Social History  Substance Use Topics  . Smoking status: Current Every Day Smoker -- 0.05 packs/day    Types: Cigarettes  . Smokeless tobacco: Never Used     Comment: Smokes Marijuana  . Alcohol Use: 0.0 oz/week    0 Standard drinks or equivalent per week     Comment: occasional   OB History    No data available     Review of Systems  Respiratory: Negative.   Cardiovascular: Negative.   Gastrointestinal: Negative.   Skin: Positive for wound.    Allergies  Review of patient's allergies indicates no known allergies.  Home Medications   Prior to Admission medications   Medication Sig Start Date End Date Taking? Authorizing Provider  amLODipine (NORVASC) 10  MG tablet Take 1 tablet (10 mg total) by mouth daily. 02/24/16   Girtha Rm, NP  amLODipine (NORVASC) 10 MG tablet Take 1 tablet (10 mg total) by mouth daily. 02/24/16   Girtha Rm, NP  aspirin EC 81 MG EC tablet Take 1 tablet (81 mg total) by mouth daily. 12/20/14   Barton Dubois, MD  clobetasol cream (TEMOVATE) 0.05 % APPLY TO AFFECTED AREA TWICE A DAY AS NEEDED (NOT TO FACE, GROIN, OR UNDERARMS) 11/19/15   Historical Provider, MD  esomeprazole (NEXIUM) 20 MG capsule Take 1 capsule (20 mg total) by mouth daily. 02/24/16   Girtha Rm, NP  hydrochlorothiazide (MICROZIDE) 12.5 MG capsule Take 1 capsule (12.5 mg total) by mouth daily. 02/24/16   Girtha Rm, NP  mometasone (ELOCON) 0.1 % cream Reported on 02/24/2016 11/19/15   Historical Provider, MD  simvastatin (ZOCOR) 10 MG tablet Take 1 tablet (10 mg total) by mouth at bedtime. 02/24/16   Girtha Rm, NP  terbinafine (LAMISIL) 250 MG tablet Take one tablet once daily x 7 days, then repeat every month x 4 months 03/08/16   Tamala Fothergill Regal, DPM  terbinafine (LAMISIL) 250 MG tablet Take 1 tablet (250 mg total) by mouth daily. 03/08/16   Wallene Huh, DPM   Meds Ordered and Administered this Visit  Medications - No data to display  BP 145/78 mmHg  Pulse 76  Temp(Src) 98.4 F (36.9 C) (Oral)  Resp 20  SpO2 100%  LMP 04/26/2008 No data found.   Physical Exam  Constitutional: She appears well-developed. No distress.  Cardiovascular: Normal rate, regular rhythm and normal heart sounds.   No murmur heard. Pulmonary/Chest: Effort normal and breath sounds normal. No respiratory distress. She has no wheezes.  Musculoskeletal:       Feet:  Nursing note and vitals reviewed.   ED Course  Procedures (including critical care time)  Labs Review Labs Reviewed - No data to display  Imaging Review No results found.   Visual Acuity Review  Right Eye Distance:   Left Eye Distance:   Bilateral Distance:    Right Eye Near:   Left  Eye Near:    Bilateral Near:         MDM  No diagnosis found. Toe infection  Nail bed infection s/p nail removal. Wound cleaned and dressed during this visit. She was prescribed Keflex. Advised to f/u with PCP within this week for reassessment. F/U as needed.    Kinnie Feil, MD 03/14/16 [phone]

## 2016-03-14 NOTE — Discharge Instructions (Signed)
It was nice seeing you. It seems you now have infection of your nail bed. Please start Keflex antibiotic. Elevate leg to relieve swelling. You may use Tylenol as needed for pain. Please see your PCP soon if symptoms worsens. Cellulitis Cellulitis is an infection of the skin and the tissue under the skin. The infected area is usually red and tender. This happens most often in the arms and lower legs. HOME CARE   Take your antibiotic medicine as told. Finish the medicine even if you start to feel better.  Keep the infected arm or leg raised (elevated).  Put a warm cloth on the area up to 4 times per day.  Only take medicines as told by your doctor.  Keep all doctor visits as told. GET HELP IF:  You see red streaks on the skin coming from the infected area.  Your red area gets bigger or turns a dark color.  Your bone or joint under the infected area is painful after the skin heals.  Your infection comes back in the same area or different area.  You have a puffy (swollen) bump in the infected area.  You have new symptoms.  You have a fever. GET HELP RIGHT AWAY IF:   You feel very sleepy.  You throw up (vomit) or have watery poop (diarrhea).  You feel sick and have muscle aches and pains.   This information is not intended to replace advice given to you by your health care provider. Make sure you discuss any questions you have with your health care provider.   Document Released: 03/29/2008 Document Revised: 07/02/2015 Document Reviewed: 12/27/2011 Elsevier Interactive Patient Education [employer].

## 2016-03-14 NOTE — ED Notes (Signed)
PT had her right 1st toenail removed 03/08/16. PT has been following care instructions. PT reports her toe started to swell Wednesday and she consulted the office. PT's toe is now red, swollen, and there is pus at base of nail bed. PT has not been on any antibiotics since it was removed.

## 2016-03-18 ENCOUNTER — Telehealth: Payer: Self-pay | Admitting: *Deleted

## 2016-03-18 NOTE — Telephone Encounter (Signed)
Left message for patient at [phone] (Home #) to check to see how they were doing from when they got their nail removed on Monday, Mar 08, 2016. Waiting for a response.

## 2016-03-21 ENCOUNTER — Encounter (HOSPITAL_COMMUNITY): Payer: Self-pay | Admitting: Emergency Medicine

## 2016-03-21 ENCOUNTER — Ambulatory Visit (HOSPITAL_COMMUNITY)
Admission: EM | Admit: 2016-03-21 | Discharge: 2016-03-21 | Disposition: A | Discharge status: Home / Self Care | Payer: BLUE CROSS/BLUE SHIELD | Attending: Emergency Medicine | Admitting: Emergency Medicine

## 2016-03-21 DIAGNOSIS — L089 Local infection of the skin and subcutaneous tissue, unspecified: Secondary | ICD-10-CM | POA: Diagnosis not present

## 2016-03-21 DIAGNOSIS — Z5189 Encounter for other specified aftercare: Secondary | ICD-10-CM

## 2016-03-21 MED ORDER — CEPHALEXIN 500 MG PO CAPS: 500.0000 mg | ORAL_CAPSULE | Freq: Four times a day (QID) | ORAL | Status: DC

## 2016-03-21 NOTE — Discharge Instructions (Signed)
Edema °Edema is an abnormal buildup of fluids. It is more common in your legs and thighs. Painless swelling of the feet and ankles is more likely as a person ages. It also is common in looser skin, like around your eyes. °HOME CARE  °· Keep the affected body part above the level of the heart while lying down. °· Do not sit still or stand for a long time. °· Do not put anything right under your knees when you lie down. °· Do not wear tight clothes on your upper legs. °· Exercise your legs to help the puffiness (swelling) go down. °· Wear elastic bandages or support stockings as told by your doctor. °· A low-salt diet may help lessen the puffiness. °· Only take medicine as told by your doctor. °GET HELP IF: °· Treatment is not working. °· You have heart, liver, or kidney disease and notice that your skin looks puffy or shiny. °· You have puffiness in your legs that does not get better when you raise your legs. °· You have sudden weight gain for no reason. °GET HELP RIGHT AWAY IF:  °· You have shortness of breath or chest pain. °· You cannot breathe when you lie down. °· You have pain, redness, or warmth in the areas that are puffy. °· You have heart, liver, or kidney disease and get edema all of a sudden. °· You have a fever and your symptoms get worse all of a sudden. °MAKE SURE YOU:  °· Understand these instructions. °· Will watch your condition. °· Will get help right away if you are not doing well or get worse. °  °This information is not intended to replace advice given to you by your health care provider. Make sure you discuss any questions you have with your health care provider. °  °Document Released: 03/29/2008 Document Revised: 10/16/2013 Document Reviewed: 08/03/2013 °Elsevier Interactive Patient Education ©2016 Elsevier Inc. ° °

## 2016-03-21 NOTE — ED Notes (Signed)
The patient presented to the [facility] with a complaint of a possible infected great toe on her right foot secondary to having her toe nail removed. The patient stated that she was seen at the [facility] on 03/08/2016 for the same complaint and prescribed an antibiotic that she has finished. She stated that the tow is still swollen and now the adjacent toes are starting to swell.

## 2016-03-21 NOTE — ED Provider Notes (Signed)
CSN: [id number]     Arrival date & time 03/21/16  1903 History   First MD Initiated Contact with Patient 03/21/16 1955     Chief Complaint  Patient presents with  . Wound Check   (Consider location/radiation/quality/duration/timing/severity/associated sxs/prior Treatment) HPI History obtained from patient:  Pt presents with the cc of:  Right great toe swelling Duration of symptoms: 2 weeks Treatment prior to arrival: Antibiotics toenail removal soaking and salt water Context: Patient had toenail removed at [facility]. She has been on antibiotics that she completed today. She states that her foot remains swollen and she is concerned that she has infection in the foot. Other symptoms include: Pain and edema in the right foot. Pain score: 2 FAMILY HISTORY: Family history of diabetes.    Past Medical History  Diagnosis Date  . Hypertension   . Tobacco abuse     pt denies smoking cigarettes currently or as history, marijuana use only  . GERD (gastroesophageal reflux disease)    Past Surgical History  Procedure Laterality Date  . Tubal ligation     Family History  Problem Relation Age of Onset  . Diabetes Mother   . Heart attack Mother   . Heart disease Mother 75    MI  . Hypertension Brother   . Heart disease Father 19    MI  . Colon cancer Neg Hx    Social History  Substance Use Topics  . Smoking status: Former Smoker -- 0.05 packs/day    Types: Cigarettes    Quit date: 01/14/2016  . Smokeless tobacco: Never Used     Comment: Smokes Marijuana  . Alcohol Use: 0.0 oz/week    0 Standard drinks or equivalent per week     Comment: occasional   OB History    No data available     Review of Systems  Denies: HEADACHE, NAUSEA, ABDOMINAL PAIN, CHEST PAIN, CONGESTION, DYSURIA, SHORTNESS OF BREATH  Allergies  Review of patient's allergies indicates no known allergies.  Home Medications   Prior to Admission medications   Medication Sig Start Date End Date  Taking? Authorizing Provider  amLODipine (NORVASC) 10 MG tablet Take 1 tablet (10 mg total) by mouth daily. 02/24/16  Yes Girtha Rm, NP  aspirin EC 81 MG EC tablet Take 1 tablet (81 mg total) by mouth daily. 12/20/14  Yes Barton Dubois, MD  cephALEXin (KEFLEX) 500 MG capsule Take 1 capsule (500 mg total) by mouth 3 (three) times daily. 03/14/16  Yes Kinnie Feil, MD  esomeprazole (NEXIUM) 20 MG capsule Take 1 capsule (20 mg total) by mouth daily. 02/24/16  Yes Girtha Rm, NP  hydrochlorothiazide (MICROZIDE) 12.5 MG capsule Take 1 capsule (12.5 mg total) by mouth daily. 02/24/16  Yes Girtha Rm, NP  simvastatin (ZOCOR) 10 MG tablet Take 1 tablet (10 mg total) by mouth at bedtime. 02/24/16  Yes Girtha Rm, NP  terbinafine (LAMISIL) 250 MG tablet Take 1 tablet (250 mg total) by mouth daily. 03/08/16  Yes Tamala Fothergill Regal, DPM  amLODipine (NORVASC) 10 MG tablet Take 1 tablet (10 mg total) by mouth daily. 02/24/16   Girtha Rm, NP  cephALEXin (KEFLEX) 500 MG capsule Take 1 capsule (500 mg total) by mouth 4 (four) times daily. 03/21/16   Konrad Felix, PA  clobetasol cream (TEMOVATE) 0.05 % APPLY TO AFFECTED AREA TWICE A DAY AS NEEDED (NOT TO FACE, GROIN, OR UNDERARMS) 11/19/15   Historical Provider, MD  mometasone (ELOCON) 0.1 %  cream Reported on 02/24/2016 11/19/15   Historical Provider, MD  terbinafine (LAMISIL) 250 MG tablet Take one tablet once daily x 7 days, then repeat every month x 4 months 03/08/16   Wallene Huh, DPM   Meds Ordered and Administered this Visit  Medications - No data to display  BP 164/84 mmHg  Pulse 97  Temp(Src) 97.8 F (36.6 C) (Oral)  Resp 20  SpO2 99%  LMP 04/26/2008 No data found.   Physical Exam NURSES NOTES AND VITAL SIGNS REVIEWED. CONSTITUTIONAL: Well developed, well nourished, no acute distress HEENT: normocephalic, atraumatic EYES: Conjunctiva normal NECK:normal ROM, supple, no adenopathy PULMONARY:No respiratory distress, normal  effort ABDOMINAL: Soft, ND, NT BS+, No CVAT MUSCULOSKELETAL: Normal ROM of all extremities, right great toe, completeRemoval of the toenail. It is granulating in nicely. The foot is swollen however and there is pitting edema but I do not think it's infected. SKIN: warm and dry without rash PSYCHIATRIC: Mood and affect, behavior are normal  ED Course  Procedures (including critical care time)  Labs Review Labs Reviewed - No data to display  Imaging Review No results found.   Visual Acuity Review  Right Eye Distance:   Left Eye Distance:   Bilateral Distance:    Right Eye Near:   Left Eye Near:    Bilateral Near:       Discussion with patient that I do not feel that of toe is infected at this time. The swelling is most likely dependent edema and patient needs to keep her foot elevated when she is not on it. She states that she will be more mindful of this and given ago tonight. I did leave a prescription for Keflex sent to pharmacy so that that she feels like it's not getting better she should get the prescription filled. Patient does have an appointment with [facility] on Tuesday which she is advised to keep  MDM   1. Visit for wound check     Patient is reassured that there are no issues that require transfer to higher level of care at this time or additional tests. Patient is advised to continue home symptomatic treatment. Patient is advised that if there are new or worsening symptoms to attend the emergency department, contact primary care provider, or return to [facility]. Instructions of care provided discharged home in stable condition.    THIS NOTE WAS GENERATED USING A VOICE RECOGNITION SOFTWARE PROGRAM. ALL REASONABLE EFFORTS  WERE MADE TO PROOFREAD THIS DOCUMENT FOR ACCURACY.  I have verbally reviewed the discharge instructions with the patient. A printed AVS was given to the patient.  All questions were answered prior to discharge.      Konrad Felix,  PA 03/21/16 2041

## 2016-03-21 NOTE — ED Notes (Signed)
Patient's toe dressed with nonadherent gauze and bandaged loosely with coban.

## 2016-03-23 ENCOUNTER — Other Ambulatory Visit: Payer: BLUE CROSS/BLUE SHIELD

## 2016-03-23 ENCOUNTER — Ambulatory Visit (INDEPENDENT_AMBULATORY_CARE_PROVIDER_SITE_OTHER): Payer: BLUE CROSS/BLUE SHIELD | Admitting: Sports Medicine

## 2016-03-23 ENCOUNTER — Encounter: Payer: Self-pay | Admitting: Sports Medicine

## 2016-03-23 ENCOUNTER — Telehealth: Payer: Self-pay | Admitting: Internal Medicine

## 2016-03-23 VITALS — BP 136/84 | HR 84 | Resp 16

## 2016-03-23 DIAGNOSIS — Z9889 Other specified postprocedural states: Secondary | ICD-10-CM

## 2016-03-23 NOTE — Progress Notes (Signed)
Patient ID: Becky Lara, female   DOB: 18-Nov-1955, 60 y.o.   MRN: [id number] Subjective: Becky Lara is a 60 y.o. female patient returns to office today for follow up evaluation after having Right Hallux permanent total nail avulsion performed on 03-08-16. Patient has been soaking using epsom salt and applying topical antibiotic covered with bandaid daily. Patient deniesfever/chills/nausea/vomitting/any other related constitutional symptoms at this time. Patient went to ER twice for concern of infection because of the drainage that she was seeing from the area. Denies any other pedal complaints.   Patient Active Problem List   Diagnosis Date Noted  . Esophageal reflux   . HTN (hypertension) 12/19/2014  . Tobacco abuse 12/19/2014  . Hypokalemia 12/19/2014  . Blurry vision, bilateral 12/19/2014  . AKI (acute kidney injury) ([place]) 12/19/2014    Current Outpatient Prescriptions on File Prior to Visit  Medication Sig Dispense Refill  . amLODipine (NORVASC) 10 MG tablet Take 1 tablet (10 mg total) by mouth daily. 90 tablet 1  . amLODipine (NORVASC) 10 MG tablet Take 1 tablet (10 mg total) by mouth daily. 90 tablet 1  . aspirin EC 81 MG EC tablet Take 1 tablet (81 mg total) by mouth daily.    . cephALEXin (KEFLEX) 500 MG capsule Take 1 capsule (500 mg total) by mouth 4 (four) times daily. 28 capsule 0  . clobetasol cream (TEMOVATE) 0.05 % APPLY TO AFFECTED AREA TWICE A DAY AS NEEDED (NOT TO FACE, GROIN, OR UNDERARMS)  2  . esomeprazole (NEXIUM) 20 MG capsule Take 1 capsule (20 mg total) by mouth daily. 14 capsule 0  . hydrochlorothiazide (MICROZIDE) 12.5 MG capsule Take 1 capsule (12.5 mg total) by mouth daily. 90 capsule 1  . mometasone (ELOCON) 0.1 % cream Reported on 02/24/2016  2  . simvastatin (ZOCOR) 10 MG tablet Take 1 tablet (10 mg total) by mouth at bedtime. 90 tablet 3  . terbinafine (LAMISIL) 250 MG tablet Take one tablet once daily x 7 days, then repeat every month x 4 months 28  tablet 0  . terbinafine (LAMISIL) 250 MG tablet Take 1 tablet (250 mg total) by mouth daily. 28 tablet 0   No current facility-administered medications on file prior to visit.    No Known Allergies  Objective:  General: Well developed, nourished, in no acute distress, alert and oriented x3   Dermatology: Skin is warm, dry and supple bilateral. Right hallux nail bed appears to be clean, dry, with mild granular tissue and surrounding eschar/scab. (-) Erythema. (+) Mild edema. (-) serosanguous drainage present. The remaining nails appear unremarkable at this time/free from symptoms. There are no other lesions or other signs of infection present.  Neurovascular status: Intact. No lower extremity swelling; No pain with calf compression bilateral.  Musculoskeletal: Minimal tenderness to palpation of the Right hallux nail bed. Muscular strength within normal limits bilateral.   Assesement and Plan: Problem List Items Addressed This Visit    None    Visit Diagnoses    S/P nail surgery    -  Primary    right hallux total PNA 03-08-16       -Examined patient  -Cleansed right nail bed and gently scrubbed with peroxide and applied antibiotic cream covered with coban dressing -Discussed plan of care with patient. -Patient to cont. soaking in a weak solution of Epsom salt and warm water. Patient was instructed to soak for 15-20 minutes each day until the toe appears normal and there is no drainage, redness, tenderness,  or swelling at the procedure site, and apply neosporin and a gauze or bandaid dressing each day as needed. May leave open to air at night. -Educated patient on long term care after nail surgery. -Patient to finish keflex -Patient was instructed to monitor the toe for reoccurrence and signs of infection; Patient advised to return to office or go to ER if toe worsens. -Patient is to return as needed or sooner if problems arise.  Landis Martins, DPM

## 2016-03-23 NOTE — Telephone Encounter (Signed)
Pt was here today for labs but from what your may 2 office visit states is coming back in 6 weeks for fasting lipids and then have cmp done too. Pt is scheduled for fasting lipids and cmp on June 13th instead of today. Can you put in future orders for me please as i am not sure what the dx is.

## 2016-03-25 ENCOUNTER — Ambulatory Visit (INDEPENDENT_AMBULATORY_CARE_PROVIDER_SITE_OTHER): Payer: BLUE CROSS/BLUE SHIELD

## 2016-03-25 ENCOUNTER — Encounter: Payer: Self-pay | Admitting: Podiatry

## 2016-03-25 ENCOUNTER — Ambulatory Visit (INDEPENDENT_AMBULATORY_CARE_PROVIDER_SITE_OTHER): Payer: BLUE CROSS/BLUE SHIELD | Admitting: Podiatry

## 2016-03-25 DIAGNOSIS — L600 Ingrowing nail: Secondary | ICD-10-CM | POA: Diagnosis not present

## 2016-03-25 DIAGNOSIS — M779 Enthesopathy, unspecified: Secondary | ICD-10-CM

## 2016-03-25 DIAGNOSIS — R600 Localized edema: Secondary | ICD-10-CM | POA: Diagnosis not present

## 2016-03-25 DIAGNOSIS — Z9889 Other specified postprocedural states: Secondary | ICD-10-CM

## 2016-03-25 MED ORDER — TRIAMCINOLONE ACETONIDE 10 MG/ML IJ SUSP
10.0000 mg | Freq: Once | INTRAMUSCULAR | Status: AC
  Administered 2016-03-25: 10 mg

## 2016-03-25 MED ORDER — METHYLPREDNISOLONE 4 MG PO TBPK: ORAL_TABLET | ORAL | Status: DC

## 2016-03-26 NOTE — Progress Notes (Signed)
Subjective:     Patient ID: Becky Lara, female   DOB: 04/05/1956, 60 y.o.   MRN: [id number]  HPI patient presents with pain on the inside of the right ankle over the last several days and states that she had gout in this area 2 years ago and a reminds her of the same thing her nails site which was done several weeks ago is healing well   Review of Systems     Objective:   Physical Exam Neurovascular status intact with inflammation and pain in the medial ankle right with redness and moderate swelling noted in the area. Negative Homan sign is noted currently    Assessment:     Probability for inflammatory tendinitis with gout-like symptomatology right    Plan:     H&P conditions reviewed and careful injection of the right medial ankle accomplished and then applied Unna boot to reduce swelling. Gave instructions on reduced activity and placed on Medrol Dosepak and gave strict instructions of any changes were to occur to let us know immediately but we will recheck her again in 1 week or earlier if any issues should occur  X-ray report was negative for signs of fracture or other bony condition currently with moderate depression of the arch

## 2016-03-31 ENCOUNTER — Other Ambulatory Visit: Payer: Self-pay | Admitting: Family Medicine

## 2016-03-31 DIAGNOSIS — Z79899 Other long term (current) drug therapy: Secondary | ICD-10-CM

## 2016-03-31 DIAGNOSIS — Z789 Other specified health status: Secondary | ICD-10-CM

## 2016-03-31 DIAGNOSIS — R7989 Other specified abnormal findings of blood chemistry: Secondary | ICD-10-CM

## 2016-03-31 DIAGNOSIS — I10 Essential (primary) hypertension: Secondary | ICD-10-CM

## 2016-04-01 ENCOUNTER — Ambulatory Visit: Payer: BLUE CROSS/BLUE SHIELD | Admitting: Podiatry

## 2016-04-06 ENCOUNTER — Other Ambulatory Visit: Payer: BLUE CROSS/BLUE SHIELD

## 2016-04-06 DIAGNOSIS — I10 Essential (primary) hypertension: Secondary | ICD-10-CM | POA: Diagnosis not present

## 2016-04-06 DIAGNOSIS — Z789 Other specified health status: Secondary | ICD-10-CM

## 2016-04-06 DIAGNOSIS — R748 Abnormal levels of other serum enzymes: Secondary | ICD-10-CM | POA: Diagnosis not present

## 2016-04-06 DIAGNOSIS — Z79899 Other long term (current) drug therapy: Secondary | ICD-10-CM

## 2016-04-06 DIAGNOSIS — R7989 Other specified abnormal findings of blood chemistry: Secondary | ICD-10-CM | POA: Diagnosis not present

## 2016-04-06 LAB — COMPREHENSIVE METABOLIC PANEL
ALT: 15 U/L (ref 6–29)
AST: 13 U/L (ref 10–35)
Albumin: 3.6 g/dL (ref 3.6–5.1)
Alkaline Phosphatase: 70 U/L (ref 33–130)
BUN: 11 mg/dL (ref 7–25)
CHLORIDE: 108 mmol/L (ref 98–110)
CO2: 27 mmol/L (ref 20–31)
CREATININE: 1 mg/dL — AB (ref 0.50–0.99)
Calcium: 8.9 mg/dL (ref 8.6–10.4)
Glucose, Bld: 92 mg/dL (ref 65–99)
Potassium: 3.7 mmol/L (ref 3.5–5.3)
SODIUM: 142 mmol/L (ref 135–146)
Total Bilirubin: 0.7 mg/dL (ref 0.2–1.2)
Total Protein: 6.6 g/dL (ref 6.1–8.1)

## 2016-04-06 LAB — LIPID PANEL
CHOL/HDL RATIO: 2.4 ratio (ref <=5.0)
Cholesterol: 160 mg/dL (ref 125–200)
HDL: 68 mg/dL (ref >=46)
LDL CALC: 77 mg/dL (ref <130)
Triglycerides: 74 mg/dL (ref <150)
VLDL: 15 mg/dL (ref <30)

## 2016-05-03 ENCOUNTER — Ambulatory Visit (INDEPENDENT_AMBULATORY_CARE_PROVIDER_SITE_OTHER): Payer: BLUE CROSS/BLUE SHIELD | Admitting: Family Medicine

## 2016-05-03 ENCOUNTER — Encounter: Payer: Self-pay | Admitting: Family Medicine

## 2016-05-03 VITALS — BP 120/80 | HR 80 | Ht 65.5 in | Wt 216.2 lb

## 2016-05-03 DIAGNOSIS — Z8639 Personal history of other endocrine, nutritional and metabolic disease: Secondary | ICD-10-CM

## 2016-05-03 DIAGNOSIS — Z8739 Personal history of other diseases of the musculoskeletal system and connective tissue: Secondary | ICD-10-CM | POA: Insufficient documentation

## 2016-05-03 DIAGNOSIS — E669 Obesity, unspecified: Secondary | ICD-10-CM | POA: Insufficient documentation

## 2016-05-03 DIAGNOSIS — I10 Essential (primary) hypertension: Secondary | ICD-10-CM

## 2016-05-03 LAB — URIC ACID: URIC ACID, SERUM: 9.5 mg/dL — AB (ref 2.5–7.0)

## 2016-05-03 NOTE — Patient Instructions (Signed)
You are doing a very good job with weight loss. Continue the healthy lifestyle choices. Your blood pressure is within goal today. The cholesterol medication combined with your recent healthy lifestyle has improved your cholesterol. We will call you with lab results. Follow up in 4 months or sooner if needed.   DASH Eating Plan DASH stands for "Dietary Approaches to Stop Hypertension." The DASH eating plan is a healthy eating plan that has been shown to reduce high blood pressure (hypertension). Additional health benefits may include reducing the risk of type 2 diabetes mellitus, heart disease, and stroke. The DASH eating plan may also help with weight loss. WHAT DO I NEED TO KNOW ABOUT THE DASH EATING PLAN? For the DASH eating plan, you will follow these general guidelines:  Choose foods with a percent daily value for sodium of less than 5% (as listed on the food label).  Use salt-free seasonings or herbs instead of table salt or sea salt.  Check with your health care provider or pharmacist before using salt substitutes.  Eat lower-sodium products, often labeled as "lower sodium" or "no salt added."  Eat fresh foods.  Eat more vegetables, fruits, and low-fat dairy products.  Choose whole grains. Look for the word "whole" as the first word in the ingredient list.  Choose fish and skinless chicken or [place] more often than red meat. Limit fish, poultry, and meat to 6 oz (170 g) each day.  Limit sweets, desserts, sugars, and sugary drinks.  Choose heart-healthy fats.  Limit cheese to 1 oz (28 g) per day.  Eat more home-cooked food and less restaurant, buffet, and fast food.  Limit fried foods.  Cook foods using methods other than frying.  Limit canned vegetables. If you do use them, rinse them well to decrease the sodium.  When eating at a restaurant, ask that your food be prepared with less salt, or no salt if possible. WHAT FOODS CAN I EAT? Seek help from a dietitian for  individual calorie needs. Grains Whole grain or whole wheat bread. Brown rice. Whole grain or whole wheat pasta. Quinoa, bulgur, and whole grain cereals. Low-sodium cereals. Corn or whole wheat flour tortillas. Whole grain cornbread. Whole grain crackers. Low-sodium crackers. Vegetables Fresh or frozen vegetables (raw, steamed, roasted, or grilled). Low-sodium or reduced-sodium tomato and vegetable juices. Low-sodium or reduced-sodium tomato sauce and paste. Low-sodium or reduced-sodium canned vegetables.  Fruits All fresh, canned (in natural juice), or frozen fruits. Meat and Other Protein Products Ground beef (85% or leaner), grass-fed beef, or beef trimmed of fat. Skinless chicken or [place]. Ground chicken or [place]. Pork trimmed of fat. All fish and seafood. Eggs. Dried beans, peas, or lentils. Unsalted nuts and seeds. Unsalted canned beans. Dairy Low-fat dairy products, such as skim or 1% milk, 2% or reduced-fat cheeses, low-fat ricotta or cottage cheese, or plain low-fat yogurt. Low-sodium or reduced-sodium cheeses. Fats and Oils Tub margarines without trans fats. Light or reduced-fat mayonnaise and salad dressings (reduced sodium). Avocado. Safflower, olive, or canola oils. Natural peanut or almond butter. Other Unsalted popcorn and pretzels. The items listed above may not be a complete list of recommended foods or beverages. Contact your dietitian for more options. WHAT FOODS ARE NOT RECOMMENDED? Grains White bread. White pasta. White rice. Refined cornbread. Bagels and croissants. Crackers that contain trans fat. Vegetables Creamed or fried vegetables. Vegetables in a cheese sauce. Regular canned vegetables. Regular canned tomato sauce and paste. Regular tomato and vegetable juices. Fruits Dried fruits. Canned fruit in light  or heavy syrup. Fruit juice. Meat and Other Protein Products Fatty cuts of meat. Ribs, chicken wings, bacon, sausage, bologna, salami, chitterlings, fatback, hot  dogs, bratwurst, and packaged luncheon meats. Salted nuts and seeds. Canned beans with salt. Dairy Whole or 2% milk, cream, half-and-half, and cream cheese. Whole-fat or sweetened yogurt. Full-fat cheeses or blue cheese. Nondairy creamers and whipped toppings. Processed cheese, cheese spreads, or cheese curds. Condiments Onion and garlic salt, seasoned salt, table salt, and sea salt. Canned and packaged gravies. Worcestershire sauce. Tartar sauce. Barbecue sauce. Teriyaki sauce. Soy sauce, including reduced sodium. Steak sauce. Fish sauce. Oyster sauce. Cocktail sauce. Horseradish. Ketchup and mustard. Meat flavorings and tenderizers. Bouillon cubes. Hot sauce. Tabasco sauce. Marinades. Taco seasonings. Relishes. Fats and Oils Butter, stick margarine, lard, shortening, ghee, and bacon fat. Coconut, palm kernel, or palm oils. Regular salad dressings. Other Pickles and olives. Salted popcorn and pretzels. The items listed above may not be a complete list of foods and beverages to avoid. Contact your dietitian for more information. WHERE CAN I FIND MORE INFORMATION? National [employer], [employer], [employer] Institute: travelstabloid.com   This information is not intended to replace advice given to you by your health care provider. Make sure you discuss any questions you have with your health care provider.   Document Released: 09/30/2011 Document Revised: 11/01/2014 Document Reviewed: 08/15/2013 Elsevier Interactive Patient Education [employer].

## 2016-05-03 NOTE — Progress Notes (Signed)
   Subjective:    Patient ID: Becky Lara, female    DOB: Oct 24, 1956, 60 y.o.   MRN: [id number]  HPI Chief Complaint  Patient presents with  . 2 month follow-up    follow-up on HTN   She is here for follow up on HTN. Reports excellent medication compliance. States she has been eating a healthy diet and exercising. Reports recent weight loss and is excited about this. Denies any concerns today regarding blood pressure. Also started taking statin 3 months ago, follow up labs shows that her cholesterol has improved. She denies any issues with statin.  Denies fever, chills, fatigue, dizziness, chest pain, DOE, abdominal pain, LE edema.   States she has been seen podiatrist for toenail to great toe on right foot for infection and the toenail was removed. States she was told in the past that she had gout, approximately 5 or 6 years ago. Reports recent possible flareups and had negative x-ray to right foot and steroid injection by the podiatrist. She denies any swelling, redness, pain to her right foot today. She would like to be checked for gout.  Reviewed allergies, medications, past medical, surgical and social history.   Review of Systems Pertinent positives and negatives in the history of present illness.     Objective:   Physical Exam BP 120/80 mmHg  Pulse 80  Ht 5' 5.5" (1.664 m)  Wt 216 lb 3.2 oz (98.068 kg)  BMI 35.42 kg/m2  LMP 04/26/2008  Alert and in no distress. Neck is supple without adenopathy or thyromegaly. Cardiac exam shows a regular sinus rhythm without murmurs or gallops. Lungs are clear to auscultation. LE without edema, pulses normal. Right foot exam shows great toe nail recently removed, no signs of infection, no edema, normal cap refill, sensation, ROM and strength.       Assessment & Plan:  Essential hypertension  History of gout - Plan: Uric acid  Obesity (BMI 30-39.9)  Discussed that her blood pressure is within goal range. No change to medication  regimen. Encouraged her to continue healthy lifestyle modification. Obesity- congratulated her on her recent weight loss and encouraged her to continue. Discussed that her BMI is currently 35 and that I recommend getting it down further and closer to 30. She appears willing to do this.  Started on statin May 2 and return lipid panel did show that cholesterol improved. Discussed that her recent healthy lifestyle modifications also had a direct impact on this most likely. Encouraged her to continue with diet and exercise. Continue on statin.   She reports history of gout diagnosis approximate 5 years ago and has had issues with recent edema and pain to her right foot with negative x-ray. Podiatrist told her she may have gout on 03/26/2016 and gave her steroid injection to right foot which she did get relief from. Plan to order uric acid level today.  Follow up pending labs and in 4 months for HTN.

## 2016-05-05 ENCOUNTER — Ambulatory Visit (INDEPENDENT_AMBULATORY_CARE_PROVIDER_SITE_OTHER): Payer: BLUE CROSS/BLUE SHIELD | Admitting: Family Medicine

## 2016-05-05 ENCOUNTER — Encounter: Payer: Self-pay | Admitting: Family Medicine

## 2016-05-05 VITALS — BP 138/80 | HR 64 | Temp 98.1°F | Wt 219.9 lb

## 2016-05-05 DIAGNOSIS — M10071 Idiopathic gout, right ankle and foot: Secondary | ICD-10-CM | POA: Diagnosis not present

## 2016-05-05 MED ORDER — COLCHICINE 0.6 MG PO TABS: 0.6000 mg | ORAL_TABLET | Freq: Two times a day (BID) | ORAL | Status: DC

## 2016-05-05 NOTE — Progress Notes (Signed)
   Subjective:    Patient ID: Becky Lara, female    DOB: 06/12/1956, 60 y.o.   MRN: [id number]  HPI Chief Complaint  Patient presents with  . gout flare up    gout flare up   She is here with complaints of redness, swelling and pain to great toe on right foot since awakening yesterday morning. States she had 2 similar episodes in June and was seen and treated by podiatrist at that time. States she had toe nail to that toe removed in May. XR of right foot was normal per patient. This was done in Dr. Mellody Drown office.  States she took 800 mg Ibuprofen last night. Nothing this morning.  Recent uric acid level elevated.    Review of Systems Pertinent positives and negatives in the history of present illness.     Objective:   Physical Exam  Constitutional: She appears well-developed and well-nourished. No distress.  Musculoskeletal:       Right foot: There is tenderness and swelling. There is normal capillary refill.  Erythema, swelling and marked tenderness to great toe on right foot. Sensation, cap refill, pulse norma.    BP 138/80 mmHg  Pulse 64  Temp(Src) 98.1 F (36.7 C) (Oral)  Wt 219 lb 14.4 oz (99.746 kg)  LMP 04/26/2008      Assessment & Plan:  Acute idiopathic gout of right foot - Plan: colchicine 0.6 MG tablet discussed that uric acid level was elevated prior to this attack. She does appear to have gout. Dr. Redmond School and this provider reviewed XR performed at podiatrist.  Recommend treatment for acute gout with colchicine and NSAIDS. She will follow up if not improving or back to normal in one week. Discussed starting Allopurinol after she is symptom free for a couple of weeks.  Colchicine prescription sent to pharmacy and instructions on how to take medication provided. She will hold simvastatan for 1 week.

## 2016-05-05 NOTE — Patient Instructions (Addendum)
Take 2 tablets initially then 1 tablet 1 hour later. Then take 1 tablet twice daily for the next week. Let me know if this does not resolve in a week. Continue taking Ibuprofen 800 mg 3 times daily with food for pain. After you are symptom-free for 2 weeks call me.  Gout Gout is an inflammatory arthritis caused by a buildup of uric acid crystals in the joints. Uric acid is a chemical that is normally present in the blood. When the level of uric acid in the blood is too high it can form crystals that deposit in your joints and tissues. This causes joint redness, soreness, and swelling (inflammation). Repeat attacks are common. Over time, uric acid crystals can form into masses (tophi) near a joint, destroying bone and causing disfigurement. Gout is treatable and often preventable. CAUSES  The disease begins with elevated levels of uric acid in the blood. Uric acid is produced by your body when it breaks down a naturally found substance called purines. Certain foods you eat, such as meats and fish, contain high amounts of purines. Causes of an elevated uric acid level include:  Being passed down from parent to child (heredity).  Diseases that cause increased uric acid production (such as obesity, psoriasis, and certain cancers).  Excessive alcohol use.  Diet, especially diets rich in meat and seafood.  Medicines, including certain cancer-fighting medicines (chemotherapy), water pills (diuretics), and aspirin.  Chronic kidney disease. The kidneys are no longer able to remove uric acid well.  Problems with metabolism. Conditions strongly associated with gout include:  Obesity.  High blood pressure.  High cholesterol.  Diabetes. Not everyone with elevated uric acid levels gets gout. It is not understood why some people get gout and others do not. Surgery, joint injury, and eating too much of certain foods are some of the factors that can lead to gout attacks. SYMPTOMS   An attack of gout  comes on quickly. It causes intense pain with redness, swelling, and warmth in a joint.  Fever can occur.  Often, only one joint is involved. Certain joints are more commonly involved:  Base of the big toe.  Knee.  Ankle.  Wrist.  Finger. Without treatment, an attack usually goes away in a few days to weeks. Between attacks, you usually will not have symptoms, which is different from many other forms of arthritis. DIAGNOSIS  Your caregiver will suspect gout based on your symptoms and exam. In some cases, tests may be recommended. The tests may include:  Blood tests.  Urine tests.  X-rays.  Joint fluid exam. This exam requires a needle to remove fluid from the joint (arthrocentesis). Using a microscope, gout is confirmed when uric acid crystals are seen in the joint fluid. TREATMENT  There are two phases to gout treatment: treating the sudden onset (acute) attack and preventing attacks (prophylaxis).  Treatment of an Acute Attack.  Medicines are used. These include anti-inflammatory medicines or steroid medicines.  An injection of steroid medicine into the affected joint is sometimes necessary.  The painful joint is rested. Movement can worsen the arthritis.  You may use warm or cold treatments on painful joints, depending which works best for you.  Treatment to Prevent Attacks.  If you suffer from frequent gout attacks, your caregiver may advise preventive medicine. These medicines are started after the acute attack subsides. These medicines either help your kidneys eliminate uric acid from your body or decrease your uric acid production. You may need to stay on these  medicines for a very long time.  The early phase of treatment with preventive medicine can be associated with an increase in acute gout attacks. For this reason, during the first few months of treatment, your caregiver may also advise you to take medicines usually used for acute gout treatment. Be sure you  understand your caregiver's directions. Your caregiver may make several adjustments to your medicine dose before these medicines are effective.  Discuss dietary treatment with your caregiver or dietitian. Alcohol and drinks high in sugar and fructose and foods such as meat, poultry, and seafood can increase uric acid levels. Your caregiver or dietitian can advise you on drinks and foods that should be limited. HOME CARE INSTRUCTIONS   Do not take aspirin to relieve pain. This raises uric acid levels.  Only take over-the-counter or prescription medicines for pain, discomfort, or fever as directed by your caregiver.  Rest the joint as much as possible. When in bed, keep sheets and blankets off painful areas.  Keep the affected joint raised (elevated).  Apply warm or cold treatments to painful joints. Use of warm or cold treatments depends on which works best for you.  Use crutches if the painful joint is in your leg.  Drink enough fluids to keep your urine clear or pale yellow. This helps your body get rid of uric acid. Limit alcohol, sugary drinks, and fructose drinks.  Follow your dietary instructions. Pay careful attention to the amount of protein you eat. Your daily diet should emphasize fruits, vegetables, whole grains, and fat-free or low-fat milk products. Discuss the use of coffee, vitamin C, and cherries with your caregiver or dietitian. These may be helpful in lowering uric acid levels.  Maintain a healthy body weight. SEEK MEDICAL CARE IF:   You develop diarrhea, vomiting, or any side effects from medicines.  You do not feel better in 24 hours, or you are getting worse. SEEK IMMEDIATE MEDICAL CARE IF:   Your joint becomes suddenly more tender, and you have chills or a fever. MAKE SURE YOU:   Understand these instructions.  Will watch your condition.  Will get help right away if you are not doing well or get worse.   This information is not intended to replace advice  given to you by your health care provider. Make sure you discuss any questions you have with your health care provider.   Document Released: 10/08/2000 Document Revised: 11/01/2014 Document Reviewed: 05/24/2012 Elsevier Interactive Patient Education [employer].

## 2016-08-10 ENCOUNTER — Other Ambulatory Visit: Payer: Self-pay | Admitting: Family Medicine

## 2016-08-10 DIAGNOSIS — I10 Essential (primary) hypertension: Secondary | ICD-10-CM

## 2016-08-30 ENCOUNTER — Encounter: Payer: BLUE CROSS/BLUE SHIELD | Admitting: Family Medicine

## 2016-09-01 ENCOUNTER — Encounter: Payer: BLUE CROSS/BLUE SHIELD | Admitting: Family Medicine

## 2016-12-08 ENCOUNTER — Other Ambulatory Visit: Payer: Self-pay | Admitting: Family Medicine

## 2016-12-08 DIAGNOSIS — Z1231 Encounter for screening mammogram for malignant neoplasm of breast: Secondary | ICD-10-CM

## 2016-12-14 ENCOUNTER — Encounter: Payer: Self-pay | Admitting: Family Medicine

## 2016-12-14 ENCOUNTER — Other Ambulatory Visit: Payer: Self-pay | Admitting: Family Medicine

## 2016-12-14 ENCOUNTER — Ambulatory Visit (INDEPENDENT_AMBULATORY_CARE_PROVIDER_SITE_OTHER): Payer: BLUE CROSS/BLUE SHIELD | Admitting: Family Medicine

## 2016-12-14 VITALS — BP 124/80 | HR 74 | Wt 215.0 lb

## 2016-12-14 DIAGNOSIS — M1A9XX0 Chronic gout, unspecified, without tophus (tophi): Secondary | ICD-10-CM | POA: Insufficient documentation

## 2016-12-14 DIAGNOSIS — R7989 Other specified abnormal findings of blood chemistry: Secondary | ICD-10-CM | POA: Diagnosis not present

## 2016-12-14 DIAGNOSIS — Z79899 Other long term (current) drug therapy: Secondary | ICD-10-CM | POA: Diagnosis not present

## 2016-12-14 DIAGNOSIS — M1A00X0 Idiopathic chronic gout, unspecified site, without tophus (tophi): Secondary | ICD-10-CM

## 2016-12-14 DIAGNOSIS — R17 Unspecified jaundice: Secondary | ICD-10-CM

## 2016-12-14 DIAGNOSIS — I10 Essential (primary) hypertension: Secondary | ICD-10-CM | POA: Diagnosis not present

## 2016-12-14 LAB — CBC WITH DIFFERENTIAL/PLATELET
Basophils Absolute: 0 cells/uL (ref 0–200)
Basophils Relative: 0 %
EOS ABS: 324 {cells}/uL (ref 15–500)
Eosinophils Relative: 4 %
HEMATOCRIT: 42 % (ref 35.0–45.0)
Hemoglobin: 14.2 g/dL (ref 11.7–15.5)
LYMPHS PCT: 21 %
Lymphs Abs: 1701 cells/uL (ref 850–3900)
MCH: 29.5 pg (ref 27.0–33.0)
MCHC: 33.8 g/dL (ref 32.0–36.0)
MCV: 87.3 fL (ref 80.0–100.0)
MONO ABS: 486 {cells}/uL (ref 200–950)
MONOS PCT: 6 %
MPV: 11.7 fL (ref 7.5–12.5)
Neutro Abs: 5589 cells/uL (ref 1500–7800)
Neutrophils Relative %: 69 %
PLATELETS: 186 10*3/uL (ref 140–400)
RBC: 4.81 MIL/uL (ref 3.80–5.10)
RDW: 14.6 % (ref 11.0–15.0)
WBC: 8.1 10*3/uL (ref 4.0–10.5)

## 2016-12-14 LAB — COMPLETE METABOLIC PANEL WITH GFR
ALBUMIN: 4 g/dL (ref 3.6–5.1)
ALK PHOS: 67 U/L (ref 33–130)
ALT: 9 U/L (ref 6–29)
AST: 13 U/L (ref 10–35)
BILIRUBIN TOTAL: 1.3 mg/dL — AB (ref 0.2–1.2)
BUN: 12 mg/dL (ref 7–25)
CO2: 21 mmol/L (ref 20–31)
Calcium: 9.5 mg/dL (ref 8.6–10.4)
Chloride: 110 mmol/L (ref 98–110)
Creat: 1.04 mg/dL — ABNORMAL HIGH (ref 0.50–0.99)
GFR, Est African American: 67 mL/min (ref >=60)
GFR, Est Non African American: 58 mL/min — ABNORMAL LOW (ref >=60)
GLUCOSE: 101 mg/dL — AB (ref 65–99)
Potassium: 4 mmol/L (ref 3.5–5.3)
Sodium: 139 mmol/L (ref 135–146)
TOTAL PROTEIN: 7.6 g/dL (ref 6.1–8.1)

## 2016-12-14 LAB — LIPID PANEL
CHOLESTEROL: 231 mg/dL — AB (ref <200)
HDL: 82 mg/dL (ref >50)
LDL CALC: 130 mg/dL — AB (ref <100)
Total CHOL/HDL Ratio: 2.8 Ratio (ref <5.0)
Triglycerides: 95 mg/dL (ref <150)
VLDL: 19 mg/dL (ref <30)

## 2016-12-14 MED ORDER — AMLODIPINE BESYLATE 10 MG PO TABS: 10.0000 mg | ORAL_TABLET | Freq: Every day | ORAL | 1 refills | Status: DC

## 2016-12-14 MED ORDER — HYDROCHLOROTHIAZIDE 12.5 MG PO CAPS: 12.5000 mg | ORAL_CAPSULE | Freq: Every day | ORAL | 1 refills | Status: DC

## 2016-12-14 MED ORDER — SIMVASTATIN 10 MG PO TABS: 10.0000 mg | ORAL_TABLET | Freq: Every day | ORAL | 1 refills | Status: DC

## 2016-12-14 MED ORDER — ALLOPURINOL 100 MG PO TABS: 100.0000 mg | ORAL_TABLET | Freq: Every day | ORAL | 1 refills | Status: DC

## 2016-12-14 MED ORDER — COLCHICINE 0.6 MG PO TABS: ORAL_TABLET | ORAL | 0 refills | Status: DC

## 2016-12-14 NOTE — Progress Notes (Signed)
   Subjective:    Patient ID: Becky Lara, female    DOB: 1956-01-24, 61 y.o.   MRN: [id number]  HPI Chief Complaint  Patient presents with  . follow-up on htn    follow-up on htn.   She is here for a follow up on HTN, hyperlipidemia, and gout. Reports feeling well.  States she does not check her blood pressure at home. Reports good compliance with BP medications.  States she stopped taking simvastatin at least 6 months ago for no particular reason. She did not call for a refill. Is fasting today.    States she has had 2-3 flare ups of gout over the past 6 months. The attacks have been in her right foot mainly. She did not call and let me know. She has been taking her uncle's colchicine when she has the flare ups and this has helped her with pain.  Last flare up was 2 weeks ago. She is not having any symptoms today.  States pork triggers her flare ups.   Denies fever, chills, dizziness, headache, vision changes, chest pain, palpitations, shortness of breath, cough, abdominal pain, GI or GU symptoms. No joint pain.    Review of Systems Pertinent positives and negatives in the history of present illness.     Objective:   Physical Exam BP 124/80   Pulse 74   Wt 215 lb (97.5 kg)   LMP 04/26/2008   BMI 35.23 kg/m  Alert and in no distress. Tympanic membranes and canals are normal. Pharyngeal area is normal. Neck is supple without adenopathy or thyromegaly. Cardiac exam shows a regular sinus rhythm without murmurs or gallops. Lungs are clear to auscultation.      Assessment & Plan:  Essential hypertension - Plan: amLODipine (NORVASC) 10 MG tablet, CBC with Differential/Platelet, COMPLETE METABOLIC PANEL WITH GFR, Lipid panel, hydrochlorothiazide (MICROZIDE) 12.5 MG capsule  Elevated serum creatinine - Plan: COMPLETE METABOLIC PANEL WITH GFR  Idiopathic chronic gout without tophus, unspecified site - Plan: colchicine (COLCRYS) 0.6 MG tablet  Medication management - Plan: Lipid  panel  Encouraged her to call and let me know if she has issues or concerns regarding medication instead of stopping her meds and not communicating.  Her blood pressure is in goal range. She will continue on current medications and with healthy lifestyle.  She did not follow up after we checked her uric acid. Her uric acid was elevated in July 2017. Discussed starting her on allopurinol for gout prevention. Will do this today. Refilled colchicine for acute attacks.  Will check labs today and follow up pending results. Will repeat uric acid in 2-3 weeks.  Refilled cholesterol medication and advised her to start back on this.

## 2016-12-14 NOTE — Patient Instructions (Signed)
If you have an acute flare up, stop the simvastatin for 2-3 days and take the colcrys as prescribed.  Start taking allopurinol once daily. We will need to repeat your labs in 2-4 weeks so go ahead and schedule your annual physical and we can do both at the same visit.    Gout Gout is painful swelling that can occur in some of your joints. Gout is a type of arthritis. This condition is caused by having too much uric acid in your body. Uric acid is a chemical that forms when your body breaks down substances called purines. Purines are important for building body proteins. When your body has too much uric acid, sharp crystals can form and build up inside your joints. This causes pain and swelling. Gout attacks can happen quickly and be very painful (acute gout). Over time, the attacks can affect more joints and become more frequent (chronic gout). Gout can also cause uric acid to build up under your skin and inside your kidneys. What are the causes? This condition is caused by too much uric acid in your blood. This can occur because:  Your kidneys do not remove enough uric acid from your blood. This is the most common cause.  Your body makes too much uric acid. This can occur with some cancers and cancer treatments. It can also occur if your body is breaking down too many red blood cells (hemolytic anemia).  You eat too many foods that are high in purines. These foods include organ meats and some seafood. Alcohol, especially beer, is also high in purines. A gout attack may be triggered by trauma or stress. What increases the risk? This condition is more likely to develop in people who:  Have a family history of gout.  Are female and middle-aged.  Are female and have gone through menopause.  Are obese.  Frequently drink alcohol, especially beer.  Are dehydrated.  Lose weight too quickly.  Have an organ transplant.  Have lead poisoning.  Take certain medicines, including aspirin,  cyclosporine, diuretics, levodopa, and niacin.  Have kidney disease or psoriasis. What are the signs or symptoms? An attack of acute gout happens quickly. It usually occurs in just one joint. The most common place is the big toe. Attacks often start at night. Other joints that may be affected include joints of the feet, ankle, knee, fingers, wrist, or elbow. Symptoms may include:  Severe pain.  Warmth.  Swelling.  Stiffness.  Tenderness. The affected joint may be very painful to touch.  Shiny, red, or purple skin.  Chills and fever. Chronic gout may cause symptoms more frequently. More joints may be involved. You may also have white or yellow lumps (tophi) on your hands or feet or in other areas near your joints. How is this diagnosed? This condition is diagnosed based on your symptoms, medical history, and physical exam. You may have tests, such as:  Blood tests to measure uric acid levels.  Removal of joint fluid with a needle (aspiration) to look for uric acid crystals.  X-rays to look for joint damage. How is this treated? Treatment for this condition has two phases: treating an acute attack and preventing future attacks. Acute gout treatment may include medicines to reduce pain and swelling, including:  NSAIDs.  Steroids. These are strong anti-inflammatory medicines that can be taken by mouth (orally) or injected into a joint.  Colchicine. This medicine relieves pain and swelling when it is taken soon after an attack. It can be  given orally or through an IV tube. Preventive treatment may include:  Daily use of smaller doses of NSAIDs or colchicine.  Use of a medicine that reduces uric acid levels in your blood.  Changes to your diet. You may need to see a specialist about healthy eating (dietitian). Follow these instructions at home: During a Gout Attack  If directed, apply ice to the affected area:  Put ice in a plastic bag.  Place a towel between your skin and  the bag.  Leave the ice on for 20 minutes, 2-3 times a day.  Rest the joint as much as possible. If the affected joint is in your leg, you may be given crutches to use.  Raise (elevate) the affected joint above the level of your heart as often as possible.  Drink enough fluids to keep your urine clear or pale yellow.  Take over-the-counter and prescription medicines only as told by your health care provider.  Do not drive or operate heavy machinery while taking prescription pain medicine.  Follow instructions from your health care provider about eating or drinking restrictions.  Return to your normal activities as told by your health care provider. Ask your health care provider what activities are safe for you. Avoiding Future Gout Attacks  Follow a low-purine diet as told by your dietitian or health care provider. Avoid foods and drinks that are high in purines, including liver, kidney, anchovies, asparagus, herring, mushrooms, mussels, and beer.  Limit alcohol intake to no more than 1 drink a day for nonpregnant women and 2 drinks a day for men. One drink equals 12 oz of beer, 5 oz of wine, or 1 oz of hard liquor.  Maintain a healthy weight or lose weight if you are overweight. If you want to lose weight, talk with your health care provider. It is important that you do not lose weight too quickly.  Start or maintain an exercise program as told by your health care provider.  Drink enough fluids to keep your urine clear or pale yellow.  Take over-the-counter and prescription medicines only as told by your health care provider.  Keep all follow-up visits as told by your health care provider. This is important. Contact a health care provider if:  You have another gout attack.  You continue to have symptoms of a gout attack after10 days of treatment.  You have side effects from your medicines.  You have chills or a fever.  You have burning pain when you urinate.  You have  pain in your lower back or belly. Get help right away if:  You have severe or uncontrolled pain.  You cannot urinate. This information is not intended to replace advice given to you by your health care provider. Make sure you discuss any questions you have with your health care provider. Document Released: 10/08/2000 Document Revised: 03/18/2016 Document Reviewed: 07/24/2015 Elsevier Interactive Patient Education  2017 [employer].

## 2016-12-22 ENCOUNTER — Ambulatory Visit
Admission: RE | Admit: 2016-12-22 | Discharge: 2016-12-22 | Disposition: A | Discharge status: Home / Self Care | Payer: BLUE CROSS/BLUE SHIELD | Source: Ambulatory Visit | Attending: Family Medicine | Admitting: Family Medicine

## 2016-12-22 DIAGNOSIS — Z1231 Encounter for screening mammogram for malignant neoplasm of breast: Secondary | ICD-10-CM | POA: Diagnosis not present

## 2016-12-27 DIAGNOSIS — H35033 Hypertensive retinopathy, bilateral: Secondary | ICD-10-CM | POA: Diagnosis not present

## 2017-01-03 ENCOUNTER — Other Ambulatory Visit: Payer: Self-pay | Admitting: Family Medicine

## 2017-01-03 DIAGNOSIS — M1A00X0 Idiopathic chronic gout, unspecified site, without tophus (tophi): Secondary | ICD-10-CM

## 2017-01-03 NOTE — Telephone Encounter (Signed)
Can pt have a refill for this ?

## 2017-01-03 NOTE — Telephone Encounter (Signed)
Ok to refill 

## 2017-01-10 ENCOUNTER — Encounter: Payer: Self-pay | Admitting: Family Medicine

## 2017-01-10 ENCOUNTER — Ambulatory Visit (INDEPENDENT_AMBULATORY_CARE_PROVIDER_SITE_OTHER): Payer: BLUE CROSS/BLUE SHIELD | Admitting: Family Medicine

## 2017-01-10 VITALS — BP 122/80 | HR 75 | Ht 65.5 in | Wt 214.2 lb

## 2017-01-10 DIAGNOSIS — R17 Unspecified jaundice: Secondary | ICD-10-CM

## 2017-01-10 DIAGNOSIS — Z0000 Encounter for general adult medical examination without abnormal findings: Secondary | ICD-10-CM

## 2017-01-10 DIAGNOSIS — E7800 Pure hypercholesterolemia, unspecified: Secondary | ICD-10-CM

## 2017-01-10 DIAGNOSIS — I10 Essential (primary) hypertension: Secondary | ICD-10-CM | POA: Diagnosis not present

## 2017-01-10 DIAGNOSIS — R7301 Impaired fasting glucose: Secondary | ICD-10-CM | POA: Diagnosis not present

## 2017-01-10 DIAGNOSIS — Z1159 Encounter for screening for other viral diseases: Secondary | ICD-10-CM | POA: Diagnosis not present

## 2017-01-10 DIAGNOSIS — E790 Hyperuricemia without signs of inflammatory arthritis and tophaceous disease: Secondary | ICD-10-CM

## 2017-01-10 DIAGNOSIS — E669 Obesity, unspecified: Secondary | ICD-10-CM

## 2017-01-10 LAB — POCT URINALYSIS DIPSTICK
BILIRUBIN UA: NEGATIVE
GLUCOSE UA: NEGATIVE
Ketones, UA: NEGATIVE
Leukocytes, UA: NEGATIVE
Nitrite, UA: NEGATIVE
RBC UA: NEGATIVE
SPEC GRAV UA: 1.03 (ref 1.030–1.035)
UROBILINOGEN UA: NEGATIVE (ref ?–2.0)
pH, UA: 6 (ref 5.0–8.0)

## 2017-01-10 LAB — LIPID PANEL
CHOL/HDL RATIO: 2.3 ratio (ref <5.0)
CHOLESTEROL: 173 mg/dL (ref <200)
HDL: 75 mg/dL (ref >50)
LDL Cholesterol: 86 mg/dL (ref <100)
Triglycerides: 59 mg/dL (ref <150)
VLDL: 12 mg/dL (ref <30)

## 2017-01-10 LAB — COMPREHENSIVE METABOLIC PANEL
ALBUMIN: 4.3 g/dL (ref 3.6–5.1)
ALK PHOS: 85 U/L (ref 33–130)
ALT: 12 U/L (ref 6–29)
AST: 17 U/L (ref 10–35)
BUN: 16 mg/dL (ref 7–25)
CO2: 28 mmol/L (ref 20–31)
Calcium: 9.3 mg/dL (ref 8.6–10.4)
Chloride: 105 mmol/L (ref 98–110)
Creat: 1.04 mg/dL — ABNORMAL HIGH (ref 0.50–0.99)
Glucose, Bld: 92 mg/dL (ref 65–99)
POTASSIUM: 3.6 mmol/L (ref 3.5–5.3)
Sodium: 139 mmol/L (ref 135–146)
TOTAL PROTEIN: 8 g/dL (ref 6.1–8.1)
Total Bilirubin: 0.7 mg/dL (ref 0.2–1.2)

## 2017-01-10 LAB — GLUCOSE, POCT (MANUAL RESULT ENTRY): POC GLUCOSE: 104 mg/dL — AB (ref 70–99)

## 2017-01-10 LAB — HEPATITIS C ANTIBODY: HCV Ab: NEGATIVE

## 2017-01-10 LAB — POCT GLYCOSYLATED HEMOGLOBIN (HGB A1C): Hemoglobin A1C: 5.1

## 2017-01-10 LAB — URIC ACID: Uric Acid, Serum: 7.9 mg/dL — ABNORMAL HIGH (ref 2.5–7.0)

## 2017-01-10 NOTE — Patient Instructions (Addendum)
You can check with [employer] [facility] [facility].   Try Melatonin for sleep and stop taking the ibuprofen PM.   Preventative Care for Adults - Female      MAINTAIN REGULAR HEALTH EXAMS:  A routine yearly physical is a good way to check in with your primary care provider about your health and preventive screening. It is also an opportunity to share updates about your health and any concerns you have, and receive a thorough all-over exam.   Most health insurance companies pay for at least some preventative services.  Check with your health plan for specific coverages.  WHAT PREVENTATIVE SERVICES DO WOMEN NEED?  Adult women should have their weight and blood pressure checked regularly.   Women age 51 and older should have their cholesterol levels checked regularly.  Women should be screened for cervical cancer with a Pap smear and pelvic exam beginning at either age 37, or 3 years after they become sexually activity.    Breast cancer screening generally begins at age 64 with a mammogram and breast exam by your primary care provider.    Beginning at age 66 and continuing to age 80, women should be screened for colorectal cancer.  Certain people may need continued testing until age 78.  Updating vaccinations is part of preventative care.  Vaccinations help protect against diseases such as the flu.  Osteoporosis is a disease in which the bones lose minerals and strength as we age. Women ages 88 and over should discuss this with their caregivers, as should women after menopause who have other risk factors.  Lab tests are generally done as part of preventative care to screen for anemia and blood disorders, to screen for problems with the kidneys and liver, to screen for bladder problems, to check blood sugar, and to check your cholesterol level.  Preventative services generally include counseling about diet, exercise, avoiding tobacco, drugs, excessive alcohol  consumption, and sexually transmitted infections.    GENERAL RECOMMENDATIONS FOR GOOD HEALTH:  Healthy diet:  Eat a variety of foods, including fruit, vegetables, animal or vegetable protein, such as meat, fish, chicken, and eggs, or beans, lentils, tofu, and grains, such as rice.  Drink plenty of water daily.  Decrease saturated fat in the diet, avoid lots of red meat, processed foods, sweets, fast foods, and fried foods.  Exercise:  Aerobic exercise helps maintain good heart health. At least 30-40 minutes of moderate-intensity exercise is recommended. For example, a brisk walk that increases your heart rate and breathing. This should be done on most days of the week.   Find a type of exercise or a variety of exercises that you enjoy so that it becomes a part of your daily life.  Examples are running, walking, swimming, water aerobics, and biking.  For motivation and support, explore group exercise such as aerobic class, spin class, Zumba, Yoga,or  martial arts, etc.    Set exercise goals for yourself, such as a certain weight goal, walk or run in a race such as a 5k walk/run.  Speak to your primary care provider about exercise goals.  Disease prevention:  If you smoke or chew tobacco, find out from your caregiver how to quit. It can literally save your life, no matter how long you have been a tobacco user. If you do not use tobacco, never begin.   Maintain a healthy diet and normal weight. Increased weight leads to problems with blood pressure and diabetes.   The Body Mass  Index or BMI is a way of measuring how much of your body is fat. Having a BMI above 27 increases the risk of heart disease, diabetes, hypertension, stroke and other problems related to obesity. Your caregiver can help determine your BMI and based on it develop an exercise and dietary program to help you achieve or maintain this important measurement at a healthful level.  High blood pressure causes heart and blood  vessel problems.  Persistent high blood pressure should be treated with medicine if weight loss and exercise do not work.   Fat and cholesterol leaves deposits in your arteries that can block them. This causes heart disease and vessel disease elsewhere in your body.  If your cholesterol is found to be high, or if you have heart disease or certain other medical conditions, then you may need to have your cholesterol monitored frequently and be treated with medication.   Ask if you should have a cardiac stress test if your history suggests this. A stress test is a test done on a treadmill that looks for heart disease. This test can find disease prior to there being a problem.  Menopause can be associated with physical symptoms and risks. Hormone replacement therapy is available to decrease these. You should talk to your caregiver about whether starting or continuing to take hormones is right for you.   Osteoporosis is a disease in which the bones lose minerals and strength as we age. This can result in serious bone fractures. Risk of osteoporosis can be identified using a bone density scan. Women ages 37 and over should discuss this with their caregivers, as should women after menopause who have other risk factors. Ask your caregiver whether you should be taking a calcium supplement and Vitamin D, to reduce the rate of osteoporosis.   Avoid drinking alcohol in excess (more than two drinks per day).  Avoid use of street drugs. Do not share needles with anyone. Ask for professional help if you need assistance or instructions on stopping the use of alcohol, cigarettes, and/or drugs.  Brush your teeth twice a day with fluoride toothpaste, and floss once a day. Good oral hygiene prevents tooth decay and gum disease. The problems can be painful, unattractive, and can cause other health problems. Visit your dentist for a routine oral and dental check up and preventive care every 6-12 months.   Look at your skin  regularly.  Use a mirror to look at your back. Notify your caregivers of changes in moles, especially if there are changes in shapes, colors, a size larger than a pencil eraser, an irregular border, or development of new moles.  Safety:  Use seatbelts 100% of the time, whether driving or as a passenger.  Use safety devices such as hearing protection if you work in environments with loud noise or significant background noise.  Use safety glasses when doing any work that could send debris in to the eyes.  Use a helmet if you ride a bike or motorcycle.  Use appropriate safety gear for contact sports.  Talk to your caregiver about gun safety.  Use sunscreen with a SPF (or skin protection factor) of 15 or greater.  Lighter skinned people are at a greater risk of skin cancer. Don't forget to also wear sunglasses in order to protect your eyes from too much damaging sunlight. Damaging sunlight can accelerate cataract formation.   Practice safe sex. Use condoms. Condoms are used for birth control and to help reduce the spread of sexually  transmitted infections (or STIs).  Some of the STIs are gonorrhea (the clap), chlamydia, syphilis, trichomonas, herpes, HPV (human papilloma virus) and HIV (human immunodeficiency virus) which causes AIDS. The herpes, HIV and HPV are viral illnesses that have no cure. These can result in disability, cancer and death.   Keep carbon monoxide and smoke detectors in your home functioning at all times. Change the batteries every 6 months or use a model that plugs into the wall.   Vaccinations:  Stay up to date with your tetanus shots and other required immunizations. You should have a booster for tetanus every 10 years. Be sure to get your flu shot every year, since 5%-20% of the [place] population comes down with the flu. The flu vaccine changes each year, so being vaccinated once is not enough. Get your shot in the fall, before the flu season peaks.   Other vaccines to  consider:  Human Papilloma Virus or HPV causes cancer of the cervix, and other infections that can be transmitted from person to person. There is a vaccine for HPV, and females should get immunized between the ages of 19 and 61. It requires a series of 3 shots.   Pneumococcal vaccine to protect against certain types of pneumonia.  This is normally recommended for adults age 17 or older.  However, adults younger than 62 years old with certain underlying conditions such as diabetes, heart or lung disease should also receive the vaccine.  Shingles vaccine to protect against Varicella Zoster if you are older than age 6, or younger than 62 years old with certain underlying illness.  Hepatitis A vaccine to protect against a form of infection of the liver by a virus acquired from food.  Hepatitis B vaccine to protect against a form of infection of the liver by a virus acquired from blood or body fluids, particularly if you work in health care.  If you plan to travel internationally, check with your local health department for specific vaccination recommendations.  Cancer Screening:  Breast cancer screening is essential to preventive care for women. All women age 40 and older should perform a breast self-exam every month. At age 31 and older, women should have their caregiver complete a breast exam each year. Women at ages 58 and older should have a mammogram (x-ray film) of the breasts. Your caregiver can discuss how often you need mammograms.    Cervical cancer screening includes taking a Pap smear (sample of cells examined under a microscope) from the cervix (end of the uterus). It also includes testing for HPV (Human Papilloma Virus, which can cause cervical cancer). Screening and a pelvic exam should begin at age 53, or 3 years after a woman becomes sexually active. Screening should occur every year, with a Pap smear but no HPV testing, up to age 17. After age 35, you should have a Pap smear every 3  years with HPV testing, if no HPV was found previously.   Most routine colon cancer screening begins at the age of 65. On a yearly basis, doctors may provide special easy to use take-home tests to check for hidden blood in the stool. Sigmoidoscopy or colonoscopy can detect the earliest forms of colon cancer and is life saving. These tests use a small camera at the end of a tube to directly examine the colon. Speak to your caregiver about this at age 53, when routine screening begins (and is repeated every 5 years unless early forms of pre-cancerous polyps or small growths are  found).

## 2017-01-10 NOTE — Progress Notes (Signed)
Subjective:    Patient ID: Becky Lara, female    DOB: 12-Sep-1956, 61 y.o.   MRN: [id number]  HPI Chief Complaint  Patient presents with  . fasting cpe    fasting cpe, no other concerns   She is here for a complete physical exam. Lipids  History of elevated bilirubin.  States she occasionally takes  Medication for sleep due to having to work different shifts. she takes ibuprofen PM 3-4 days per week.  Started back on statin and needs repeat lipid panel. She is fasting.  Taking allopurinol for gout prophylaxis and needs repeat uric acid level. No recent gout flares since starting medication.  Fasting BS marginally elevated at her last appointment.   Other providers: dermatologist- Dr. Allyson Sabal.   Diet: fairly healthy Excerise: she is not exercising.   Immunizations: Tdap and pneumovax given in past 2 years.   Health maintenance:  Mammogram: 11/2015 Colonoscopy: 2017- polyps and due again in 2022 Last pap smear: 11/2015 and normal.  Last Dental Exam: 2 years ago. States she needs her teeth pulled and dentures but cannot afford it. Was told it would cost $4,000.  Last Eye Exam: past 2 years  Wears seatbelt always, smoke detectors in home and functioning, does not text while driving and feels safe in home environment.   Reviewed allergies, medications, past medical, surgical, family, and social history.   Review of Systems Review of Systems Constitutional: -fever, -chills, -sweats, -unexpected weight change,-fatigue ENT: -runny nose, -ear pain, -sore throat Cardiology:  -chest pain, -palpitations, -edema Respiratory: -cough, -shortness of breath, -wheezing Gastroenterology: -abdominal pain, -nausea, -vomiting, -diarrhea, -constipation  Hematology: -bleeding or bruising problems Musculoskeletal: -arthralgias, -myalgias, -joint swelling, -back pain Ophthalmology: -vision changes Urology: -dysuria, -difficulty urinating, -hematuria, -urinary frequency,  -urgency Neurology: -headache, -weakness, -tingling, -numbness       Objective:   Physical Exam BP 122/80   Pulse 75   Ht 5' 5.5" (1.664 m)   Wt 214 lb 3.2 oz (97.2 kg)   LMP 04/26/2008   BMI 35.10 kg/m   General Appearance:    Alert, cooperative, no distress, appears stated age  Head:    Normocephalic, without obvious abnormality, atraumatic  Eyes:    PERRL, conjunctiva/corneas clear, EOM's intact, fundi    benign  Ears:    Normal TM's and external ear canals  Nose:   Nares normal, mucosa normal, no drainage or sinus   tenderness  Throat:   Lips, mucosa, and tongue normal; teeth and gums normal  Neck:   Supple, no lymphadenopathy;  thyroid:  no   enlargement/tenderness/nodules; no carotid   bruit or JVD  Back:    Spine nontender, no curvature, ROM normal, no CVA     tenderness  Lungs:     Clear to auscultation bilaterally without wheezes, rales or     ronchi; respirations unlabored  Chest Wall:    No tenderness or deformity   Heart:    Regular rate and rhythm, S1 and S2 normal, no murmur, rub   or gallop  Breast Exam:    No tenderness, masses, or nipple discharge or inversion.      No axillary lymphadenopathy  Abdomen:     Soft, non-tender, nondistended, normoactive bowel sounds,    no masses, no hepatosplenomegaly  Genitalia:    Refused. Pap smear is up to date.      Extremities:   No clubbing, cyanosis or edema  Pulses:   2+ and symmetric all extremities  Skin:   Skin color,  texture, turgor normal, no rashes or lesions  Lymph nodes:   Cervical, supraclavicular, and axillary nodes normal  Neurologic:   CNII-XII intact, normal strength, sensation and gait; reflexes 2+ and symmetric throughout          Psych:   Normal mood, affect, hygiene and grooming.    Urinalysis dipstick: trace protein     Assessment & Plan:  Routine general medical examination at a health care facility - Plan: Urinalysis Dipstick, Comprehensive metabolic panel, Lipid panel  Elevated blood uric  acid level - Plan: Uric acid  High serum total bilirubin - Plan: Comprehensive metabolic panel  Elevated fasting blood sugar - Plan: Comprehensive metabolic panel, POCT glucose (manual entry), POCT glycosylated hemoglobin (Hb A1C)  Essential hypertension  Obesity (BMI 30-39.9)  Elevated LDL cholesterol level  Need for hepatitis C screening test - Plan: Hepatitis C antibody  POCT fasting blood sugar 104. Hemoglobin A1c 5.1% No sign of diabetes.  She will continue taking statin. ASCVD 10 year 13.5%  Elevated serum bilirubin in past. Will repeat this.  Blood pressure is in goal range. Continue on current medications. Counseled on diet and exercise for obesity, HTN and hyperlipidemia.  Hep C testing done Advised against her taking Ibuprofen PM as she is at risk of stomach ulcer and recommend trying melatonin.  Will recheck uric acid and adjust allopurinol as indicated.  She is up to date on health maintenance.  She will follow up in a month or so regarding shingrix if she is interested.  Follow up in 6 months or sooner pending lab results.  Follow up pending labs.

## 2017-01-11 ENCOUNTER — Other Ambulatory Visit: Payer: Self-pay | Admitting: Family Medicine

## 2017-01-11 DIAGNOSIS — E790 Hyperuricemia without signs of inflammatory arthritis and tophaceous disease: Secondary | ICD-10-CM

## 2017-01-11 MED ORDER — ALLOPURINOL 100 MG PO TABS: 200.0000 mg | ORAL_TABLET | Freq: Every day | ORAL | 0 refills | Status: DC

## 2017-02-07 ENCOUNTER — Other Ambulatory Visit (INDEPENDENT_AMBULATORY_CARE_PROVIDER_SITE_OTHER): Payer: BLUE CROSS/BLUE SHIELD

## 2017-02-07 DIAGNOSIS — E790 Hyperuricemia without signs of inflammatory arthritis and tophaceous disease: Secondary | ICD-10-CM | POA: Diagnosis not present

## 2017-02-08 ENCOUNTER — Other Ambulatory Visit: Payer: Self-pay | Admitting: Family Medicine

## 2017-02-08 DIAGNOSIS — M109 Gout, unspecified: Secondary | ICD-10-CM

## 2017-02-08 DIAGNOSIS — Z79899 Other long term (current) drug therapy: Secondary | ICD-10-CM

## 2017-02-08 LAB — URIC ACID: URIC ACID, SERUM: 7.2 mg/dL — AB (ref 2.5–7.0)

## 2017-02-08 MED ORDER — ALLOPURINOL 300 MG PO TABS: 300.0000 mg | ORAL_TABLET | Freq: Every day | ORAL | 1 refills | Status: DC

## 2017-02-08 MED ORDER — ALLOPURINOL 300 MG PO TABS: 300.0000 mg | ORAL_TABLET | Freq: Every day | ORAL | 0 refills | Status: DC

## 2017-03-22 ENCOUNTER — Other Ambulatory Visit: Payer: BLUE CROSS/BLUE SHIELD

## 2017-03-22 DIAGNOSIS — M109 Gout, unspecified: Secondary | ICD-10-CM

## 2017-03-22 DIAGNOSIS — Z79899 Other long term (current) drug therapy: Secondary | ICD-10-CM

## 2017-03-22 LAB — BASIC METABOLIC PANEL
BUN: 18 mg/dL (ref 7–25)
CALCIUM: 9.3 mg/dL (ref 8.6–10.4)
CHLORIDE: 108 mmol/L (ref 98–110)
CO2: 26 mmol/L (ref 20–31)
Creat: 1.26 mg/dL — ABNORMAL HIGH (ref 0.50–0.99)
GLUCOSE: 88 mg/dL (ref 65–99)
Potassium: 4.1 mmol/L (ref 3.5–5.3)
SODIUM: 140 mmol/L (ref 135–146)

## 2017-03-23 LAB — URIC ACID: Uric Acid, Serum: 5.2 mg/dL (ref 2.5–7.0)

## 2017-04-21 ENCOUNTER — Other Ambulatory Visit: Payer: Self-pay | Admitting: Family Medicine

## 2017-05-26 ENCOUNTER — Other Ambulatory Visit: Payer: Self-pay | Admitting: Family Medicine

## 2017-07-06 ENCOUNTER — Encounter: Payer: Self-pay | Admitting: Family Medicine

## 2017-07-06 ENCOUNTER — Ambulatory Visit (INDEPENDENT_AMBULATORY_CARE_PROVIDER_SITE_OTHER): Payer: BLUE CROSS/BLUE SHIELD | Admitting: Family Medicine

## 2017-07-06 VITALS — BP 120/70 | HR 75 | Wt 206.4 lb

## 2017-07-06 DIAGNOSIS — R42 Dizziness and giddiness: Secondary | ICD-10-CM | POA: Diagnosis not present

## 2017-07-06 DIAGNOSIS — R7989 Other specified abnormal findings of blood chemistry: Secondary | ICD-10-CM

## 2017-07-06 DIAGNOSIS — M1A00X0 Idiopathic chronic gout, unspecified site, without tophus (tophi): Secondary | ICD-10-CM

## 2017-07-06 DIAGNOSIS — E7800 Pure hypercholesterolemia, unspecified: Secondary | ICD-10-CM

## 2017-07-06 DIAGNOSIS — I10 Essential (primary) hypertension: Secondary | ICD-10-CM

## 2017-07-06 DIAGNOSIS — R8299 Other abnormal findings in urine: Secondary | ICD-10-CM | POA: Diagnosis not present

## 2017-07-06 DIAGNOSIS — K219 Gastro-esophageal reflux disease without esophagitis: Secondary | ICD-10-CM | POA: Diagnosis not present

## 2017-07-06 DIAGNOSIS — R82998 Other abnormal findings in urine: Secondary | ICD-10-CM

## 2017-07-06 LAB — POCT URINALYSIS DIP (PROADVANTAGE DEVICE)
Bilirubin, UA: NEGATIVE
GLUCOSE UA: NEGATIVE mg/dL
Ketones, POC UA: NEGATIVE mg/dL
LEUKOCYTES UA: NEGATIVE
NITRITE UA: NEGATIVE
Protein Ur, POC: NEGATIVE mg/dL
SPECIFIC GRAVITY, URINE: 1.02
UUROB: NEGATIVE
pH, UA: 6 (ref 5.0–8.0)

## 2017-07-06 MED ORDER — HYDROCHLOROTHIAZIDE 12.5 MG PO CAPS: 12.5000 mg | ORAL_CAPSULE | Freq: Every day | ORAL | 1 refills | Status: DC

## 2017-07-06 MED ORDER — AMLODIPINE BESYLATE 10 MG PO TABS: 10.0000 mg | ORAL_TABLET | Freq: Every day | ORAL | 1 refills | Status: DC

## 2017-07-06 MED ORDER — COLCHICINE 0.6 MG PO TABS: ORAL_TABLET | ORAL | 0 refills | Status: DC

## 2017-07-06 MED ORDER — ALLOPURINOL 300 MG PO TABS: 300.0000 mg | ORAL_TABLET | Freq: Every day | ORAL | 1 refills | Status: DC

## 2017-07-06 NOTE — Progress Notes (Signed)
Subjective:    Patient ID: Becky Lara, female    DOB: Mar 12, 1956, 61 y.o.   MRN: [id number]  HPI Chief Complaint  Patient presents with  . 6 month follow-up    6 month follow-up. will get flu shot at work   She is here for a 6 month med check on chronic health conditions. She also has a new complaint.   HTN- does not check it at home. No problems with medication compliance.   Elevated LDL - reports good medication compliance.   Gout- no recent flares. States she needs a refill of allopurinol.   GERD- has not been taking Nexium. Takes Tums as needed. Avoids food triggers.   Smoker- does not smoke cigarettes. States she only smokes marijuana.   Complains of intermittent episodes of dizziness for the past 3 weeks.  No falls. Dizziness is with position changes only. Denies numbness, tingling or weakness.  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 States she does not 8 lb weight loss since March. States she skips meals and does not drink water very often.   Denies fever, chills, URI symptoms, chest pain, palpitations, shortness of breath, abdominal pain, N/V/D, urinary symptoms, LE edema.   Reviewed allergies, medications, past medical, surgical, family, and social history.   Review of Systems Pertinent positives and negatives in the history of present illness.     Objective:   Physical Exam  Constitutional: She is oriented to person, place, and time. She appears well-developed and well-nourished. No distress.  Eyes: Pupils are equal, round, and reactive to light. Conjunctivae and EOM are normal.  Neck: Normal range of motion. Neck supple. No thyromegaly present.  Cardiovascular: Normal rate, regular rhythm, normal heart sounds and intact distal pulses.  Exam reveals no gallop and no friction rub.   No murmur heard. No LE edema  Pulmonary/Chest: Effort normal and breath sounds normal.  Musculoskeletal: Normal range of motion.  Lymphadenopathy:   She has no cervical adenopathy.  Neurological: She is alert and oriented to person, place, and time. She has normal strength and normal reflexes. No cranial nerve deficit or sensory deficit. She displays a negative Romberg sign. Coordination and gait normal.  Normal finger to nose  Skin: Skin is warm and dry. No rash noted. No erythema. No pallor.  Psychiatric: She has a normal mood and affect. Her speech is normal and behavior is normal. Thought content normal.   BP 120/70   Pulse 75   Wt 206 lb 6.4 oz (93.6 kg)   LMP 04/26/2008   BMI 33.82 kg/m       Assessment & Plan:  Essential hypertension - Plan: CBC with Differential/Platelet, COMPLETE METABOLIC PANEL WITH GFR, amLODipine (NORVASC) 10 MG tablet, hydrochlorothiazide (MICROZIDE) 12.5 MG capsule  Gastroesophageal reflux disease, esophagitis presence not specified  Elevated serum creatinine - Plan: POCT Urinalysis DIP (Proadvantage Device), Microalbumin / creatinine urine ratio  Idiopathic chronic gout without tophus, unspecified site - Plan: colchicine 0.6 MG tablet  Elevated LDL cholesterol level  Dizziness - Plan: CBC with Differential/Platelet, COMPLETE METABOLIC PANEL WITH GFR, POCT Urinalysis DIP (Proadvantage Device), TSH  Dark urine - Plan: POCT Urinalysis DIP (Proadvantage Device), Microalbumin / creatinine urine ratio  UA spec grav 1.020, neg HTN- BP well controlled. Continue on current medication.  GERD- controlled with diet, lifestyle and occasional Tums Gout- no recent flares and will continue on current medication.  She appears to be doing well on all medications and no side  effects.  Suspect dizziness is related to mild dehydration and skipping meals. Encouraged her to take better care of herself and increase fluids. Normal neuro exam. Also encouraged her to stop smoking marijuana.  Follow up pending labs and in March for a fasting CPE.

## 2017-07-06 NOTE — Patient Instructions (Addendum)
Start drinking more water and small frequent meals. Do not skip meals. If the dizzy spells are getting worse or not improving then let me know.   Take a multivitamin.   We will call you with lab results. Follow up in late March for your fasting annual exam.

## 2017-07-07 LAB — COMPLETE METABOLIC PANEL WITH GFR
AG RATIO: 1.2 (calc) (ref 1.0–2.5)
ALT: 9 U/L (ref 6–29)
AST: 12 U/L (ref 10–35)
Albumin: 4.1 g/dL (ref 3.6–5.1)
Alkaline phosphatase (APISO): 67 U/L (ref 33–130)
BUN/Creatinine Ratio: 12 (calc) (ref 6–22)
BUN: 13 mg/dL (ref 7–25)
CALCIUM: 9.4 mg/dL (ref 8.6–10.4)
CHLORIDE: 106 mmol/L (ref 98–110)
CO2: 28 mmol/L (ref 20–32)
Creat: 1.05 mg/dL — ABNORMAL HIGH (ref 0.50–0.99)
GFR, EST AFRICAN AMERICAN: 66 mL/min/{1.73_m2} (ref >=60)
GFR, EST NON AFRICAN AMERICAN: 57 mL/min/{1.73_m2} — AB (ref >=60)
GLOBULIN: 3.3 g/dL (ref 1.9–3.7)
Glucose, Bld: 94 mg/dL (ref 65–99)
Potassium: 3.8 mmol/L (ref 3.5–5.3)
SODIUM: 139 mmol/L (ref 135–146)
Total Bilirubin: 0.8 mg/dL (ref 0.2–1.2)
Total Protein: 7.4 g/dL (ref 6.1–8.1)

## 2017-07-07 LAB — CBC WITH DIFFERENTIAL/PLATELET
BASOS ABS: 58 {cells}/uL (ref 0–200)
Basophils Relative: 0.8 %
EOS ABS: 613 {cells}/uL — AB (ref 15–500)
EOS PCT: 8.4 %
HEMATOCRIT: 40.8 % (ref 35.0–45.0)
HEMOGLOBIN: 13.6 g/dL (ref 11.7–15.5)
Lymphs Abs: 1708 cells/uL (ref 850–3900)
MCH: 29.1 pg (ref 27.0–33.0)
MCHC: 33.3 g/dL (ref 32.0–36.0)
MCV: 87.4 fL (ref 80.0–100.0)
MPV: 13.6 fL — ABNORMAL HIGH (ref 7.5–12.5)
Monocytes Relative: 6.8 %
NEUTROS ABS: 4424 {cells}/uL (ref 1500–7800)
Neutrophils Relative %: 60.6 %
Platelets: 172 10*3/uL (ref 140–400)
RBC: 4.67 10*6/uL (ref 3.80–5.10)
RDW: 13.5 % (ref 11.0–15.0)
Total Lymphocyte: 23.4 %
WBC mixed population: 496 cells/uL (ref 200–950)
WBC: 7.3 10*3/uL (ref 3.8–10.8)

## 2017-07-07 LAB — MICROALBUMIN / CREATININE URINE RATIO
Creatinine, Urine: 78 mg/dL (ref 20–275)
Microalb Creat Ratio: 18 mcg/mg creat (ref <30)
Microalb, Ur: 1.4 mg/dL

## 2017-07-07 LAB — TSH: TSH: 1.55 mIU/L (ref 0.40–4.50)

## 2017-08-18 ENCOUNTER — Other Ambulatory Visit (INDEPENDENT_AMBULATORY_CARE_PROVIDER_SITE_OTHER): Payer: BLUE CROSS/BLUE SHIELD

## 2017-08-18 DIAGNOSIS — Z23 Encounter for immunization: Secondary | ICD-10-CM

## 2017-08-24 ENCOUNTER — Other Ambulatory Visit: Payer: Self-pay | Admitting: Family Medicine

## 2017-09-28 DIAGNOSIS — L309 Dermatitis, unspecified: Secondary | ICD-10-CM | POA: Diagnosis not present

## 2017-11-22 ENCOUNTER — Other Ambulatory Visit: Payer: Self-pay | Admitting: Family Medicine

## 2017-12-08 ENCOUNTER — Encounter: Payer: Self-pay | Admitting: Family Medicine

## 2017-12-08 ENCOUNTER — Ambulatory Visit (INDEPENDENT_AMBULATORY_CARE_PROVIDER_SITE_OTHER): Payer: BLUE CROSS/BLUE SHIELD | Admitting: Family Medicine

## 2017-12-08 ENCOUNTER — Ambulatory Visit
Admission: RE | Admit: 2017-12-08 | Discharge: 2017-12-08 | Disposition: A | Discharge status: Home / Self Care | Payer: BLUE CROSS/BLUE SHIELD | Source: Ambulatory Visit | Attending: Family Medicine | Admitting: Family Medicine

## 2017-12-08 VITALS — BP 120/80 | HR 73 | Temp 98.7°F | Wt 205.6 lb

## 2017-12-08 DIAGNOSIS — R0789 Other chest pain: Secondary | ICD-10-CM

## 2017-12-08 DIAGNOSIS — M5134 Other intervertebral disc degeneration, thoracic region: Secondary | ICD-10-CM

## 2017-12-08 DIAGNOSIS — I10 Essential (primary) hypertension: Secondary | ICD-10-CM

## 2017-12-08 DIAGNOSIS — K219 Gastro-esophageal reflux disease without esophagitis: Secondary | ICD-10-CM | POA: Diagnosis not present

## 2017-12-08 HISTORY — DX: Other intervertebral disc degeneration, thoracic region: M51.34

## 2017-12-08 MED ORDER — DEXLANSOPRAZOLE 60 MG PO CPDR: 60.0000 mg | DELAYED_RELEASE_CAPSULE | Freq: Every day | ORAL | 0 refills | Status: DC

## 2017-12-08 NOTE — Progress Notes (Signed)
   Subjective:    Patient ID: Becky Lara, female    DOB: Mar 23, 1956, 62 y.o.   MRN: [id number]  HPI Chief Complaint  Patient presents with  . pain under breast at rib cage    pain under breasts at rib cage. been going on for a couple week. has Acid reflex but tums aren't working. no SOB, NO CHEST PAIN.   . med    needs refill on allopurinol   She is here with complaints of a 4 week history of constant pressure to her upper abdomen and gas. States pain is worse when she lays down. Pain is non radiating. Pain is not worse with exertion or deep inspiration or movement. No aggravating or alleviating symptoms.    States she went to the hospital the last time she felt like this and was told it was GERD.  She has tried Tums for this but no relief. Stopped Nexium several months ago.   Normal cardiac work up including echo in 2016.   Family history of heart attack in both parents.   Denies fever, chills, dizziness, palpitations, shortness of breath, cough, orthopnea, PND, vomiting, diarrhea, LE edema.    Reviewed allergies, medications, past medical, surgical, family, and social history.   Review of Systems Pertinent positives and negatives in the history of present illness.     Objective:   Physical Exam BP 120/80   Pulse 73   Temp 98.7 F (37.1 C) (Oral)   Wt 205 lb 9.6 oz (93.3 kg)   LMP 04/26/2008   SpO2 98%   BMI 33.69 kg/m   Alert and in no distress. Pharyngeal area is normal. Neck is supple without adenopathy or thyromegaly. Cardiac exam shows a regular sinus rhythm without murmurs or gallops. Lungs are clear to auscultation. Abdomen is soft, non distended, normal BS, epigastric tenderness without rebound or referred pain. Skin warm and dry, no pallor.        Assessment & Plan:  Chest pressure - Plan: EKG 12-Lead, DG Chest 2 View  Essential hypertension  Gastroesophageal reflux disease, esophagitis presence not specified  ECG is unremarkable. BP is at goal.    GI cocktail with benadryl 12.5 mg, maalox and viscous lidocaine given to patient and she reported relief of symptoms within 10 minutes.  Appears GERD related based on improvement with GI cocktail.  Will send for chest XR.  Dexilant samples given #10 and will have her follow up in 2 weeks.

## 2017-12-08 NOTE — Patient Instructions (Signed)
Take one Dexilant tablet 30 minutes before a meal (around the same time) daily for the next 10 days.  Follow up in 2 weeks or sooner if the pain worsens or any new symptoms arise.    Gastroesophageal Reflux Disease, Adult Normally, food travels down the esophagus and stays in the stomach to be digested. If a person has gastroesophageal reflux disease (GERD), food and stomach acid move back up into the esophagus. When this happens, the esophagus becomes sore and swollen (inflamed). Over time, GERD can make small holes (ulcers) in the lining of the esophagus. Follow these instructions at home: Diet  Follow a diet as told by your doctor. You may need to avoid foods and drinks such as: ? Coffee and tea (with or without caffeine). ? Drinks that contain alcohol. ? Energy drinks and sports drinks. ? Carbonated drinks or sodas. ? Chocolate and cocoa. ? Peppermint and mint flavorings. ? Garlic and onions. ? Horseradish. ? Spicy and acidic foods, such as peppers, chili powder, curry powder, vinegar, hot sauces, and BBQ sauce. ? Citrus fruit juices and citrus fruits, such as oranges, lemons, and limes. ? Tomato-based foods, such as red sauce, chili, salsa, and pizza with red sauce. ? Fried and fatty foods, such as donuts, french fries, potato chips, and high-fat dressings. ? High-fat meats, such as hot dogs, rib eye steak, sausage, ham, and bacon. ? High-fat dairy items, such as whole milk, butter, and cream cheese.  Eat small meals often. Avoid eating large meals.  Avoid drinking large amounts of liquid with your meals.  Avoid eating meals during the 2-3 hours before bedtime.  Avoid lying down right after you eat.  Do not exercise right after you eat. General instructions  Pay attention to any changes in your symptoms.  Take over-the-counter and prescription medicines only as told by your doctor. Do not take aspirin, ibuprofen, or other NSAIDs unless your doctor says it is okay.  Do not  use any tobacco products, including cigarettes, chewing tobacco, and e-cigarettes. If you need help quitting, ask your doctor.  Wear loose clothes. Do not wear anything tight around your waist.  Raise (elevate) the head of your bed about 6 inches (15 cm).  Try to lower your stress. If you need help doing this, ask your doctor.  If you are overweight, lose an amount of weight that is healthy for you. Ask your doctor about a safe weight loss goal.  Keep all follow-up visits as told by your doctor. This is important. Contact a doctor if:  You have new symptoms.  You lose weight and you do not know why it is happening.  You have trouble swallowing, or it hurts to swallow.  You have wheezing or a cough that keeps happening.  Your symptoms do not get better with treatment.  You have a hoarse voice. Get help right away if:  You have pain in your arms, neck, jaw, teeth, or back.  You feel sweaty, dizzy, or light-headed.  You have chest pain or shortness of breath.  You throw up (vomit) and your throw up looks like blood or coffee grounds.  You pass out (faint).  Your poop (stool) is bloody or black.  You cannot swallow, drink, or eat. This information is not intended to replace advice given to you by your health care provider. Make sure you discuss any questions you have with your health care provider. Document Released: 03/29/2008 Document Revised: 03/18/2016 Document Reviewed: 02/05/2015 Elsevier Interactive Patient Education  2018  [employer].

## 2017-12-23 ENCOUNTER — Encounter: Payer: Self-pay | Admitting: Family Medicine

## 2017-12-23 ENCOUNTER — Ambulatory Visit (INDEPENDENT_AMBULATORY_CARE_PROVIDER_SITE_OTHER): Payer: BLUE CROSS/BLUE SHIELD | Admitting: Family Medicine

## 2017-12-23 VITALS — BP 120/80 | HR 65 | Wt 205.8 lb

## 2017-12-23 DIAGNOSIS — Z79899 Other long term (current) drug therapy: Secondary | ICD-10-CM

## 2017-12-23 DIAGNOSIS — K219 Gastro-esophageal reflux disease without esophagitis: Secondary | ICD-10-CM | POA: Diagnosis not present

## 2017-12-23 DIAGNOSIS — M1A00X0 Idiopathic chronic gout, unspecified site, without tophus (tophi): Secondary | ICD-10-CM | POA: Diagnosis not present

## 2017-12-23 MED ORDER — ALLOPURINOL 300 MG PO TABS: 300.0000 mg | ORAL_TABLET | Freq: Every day | ORAL | 1 refills | Status: DC

## 2017-12-23 MED ORDER — ALLOPURINOL 300 MG PO TABS: 300.0000 mg | ORAL_TABLET | Freq: Every day | ORAL | 0 refills | Status: DC

## 2017-12-23 NOTE — Patient Instructions (Signed)
I refilled your allopurinol. Start back on this.   We will check your uric acid level and call you.   Come in fasting for your physical in April.

## 2017-12-23 NOTE — Progress Notes (Signed)
   Subjective:    Patient ID: Becky Lara, female    DOB: 11/28/1955, 62 y.o.   MRN: [id number]  HPI Chief Complaint  Patient presents with  . follow-up    acid reflux is better on med. needs an refill on allopurinol   She is here to follow up on chest pain that was thought to be related to GERD. Chest pain resolved with Dexilant.   States she has been out of allopurinol for the past 2 weeks. No gout flares.   Denies fever, chills, dizziness, chest pain, palpitations, shortness of breath, abdominal pain, N/V/D, urinary symptoms, LE edema.   Reviewed allergies, medications, past medical, surgical, family, and social history.    Review of Systems Pertinent positives and negatives in the history of present illness.     Objective:   Physical Exam BP 120/80   Pulse 65   Wt 205 lb 12.8 oz (93.4 kg)   LMP 04/26/2008   BMI 33.73 kg/m   Alert and oriented and in no acute distress.  Not otherwise examined.      Assessment & Plan:  Gastroesophageal reflux disease, esophagitis presence not specified  Idiopathic chronic gout without tophus, unspecified site - Plan: Uric acid, allopurinol (ZYLOPRIM) 300 MG tablet  Medication management - Plan: Uric acid  Chest pain resolved with Dexilant, therefore, her symptoms were most likely related to GERD. She will let me know when she runs out of Dexilant and we will consider prescribing Nexium. Discussed that I will also consider GI referral if we cannot control this.  History of gout and no recent flares.  We will check a uric acid level and refill allopurinol. Follow-up as scheduled in April.

## 2017-12-24 LAB — URIC ACID: Uric Acid: 7.8 mg/dL — ABNORMAL HIGH (ref 2.5–7.1)

## 2018-01-13 ENCOUNTER — Encounter: Payer: BLUE CROSS/BLUE SHIELD | Admitting: Family Medicine

## 2018-01-24 ENCOUNTER — Encounter: Payer: BLUE CROSS/BLUE SHIELD | Admitting: Family Medicine

## 2018-01-25 ENCOUNTER — Encounter: Payer: BLUE CROSS/BLUE SHIELD | Admitting: Family Medicine

## 2018-01-26 ENCOUNTER — Other Ambulatory Visit: Payer: Self-pay | Admitting: Family Medicine

## 2018-01-26 DIAGNOSIS — Z139 Encounter for screening, unspecified: Secondary | ICD-10-CM

## 2018-01-30 ENCOUNTER — Ambulatory Visit
Admission: RE | Admit: 2018-01-30 | Discharge: 2018-01-30 | Disposition: A | Discharge status: Home / Self Care | Payer: BLUE CROSS/BLUE SHIELD | Source: Ambulatory Visit | Attending: Family Medicine | Admitting: Family Medicine

## 2018-01-30 DIAGNOSIS — Z1231 Encounter for screening mammogram for malignant neoplasm of breast: Secondary | ICD-10-CM | POA: Diagnosis not present

## 2018-01-30 DIAGNOSIS — Z139 Encounter for screening, unspecified: Secondary | ICD-10-CM

## 2018-02-20 ENCOUNTER — Other Ambulatory Visit: Payer: Self-pay | Admitting: Family Medicine

## 2018-02-20 NOTE — Telephone Encounter (Signed)
Pt was here back in march for a follow-up on meds but I do not see lipids was done and its been over a year. Ok to refill until she comes in in may

## 2018-02-20 NOTE — Telephone Encounter (Signed)
Ok to refill until her fasting CPE, should be in the next 1-2 months

## 2018-02-20 NOTE — Telephone Encounter (Signed)
She gets 90 days supplies at a time since its mail order

## 2018-02-23 ENCOUNTER — Encounter: Payer: BLUE CROSS/BLUE SHIELD | Admitting: Family Medicine

## 2018-03-08 ENCOUNTER — Other Ambulatory Visit: Payer: Self-pay | Admitting: Family Medicine

## 2018-03-08 DIAGNOSIS — I10 Essential (primary) hypertension: Secondary | ICD-10-CM

## 2018-03-22 ENCOUNTER — Encounter: Payer: Self-pay | Admitting: Family Medicine

## 2018-05-21 ENCOUNTER — Other Ambulatory Visit: Payer: Self-pay | Admitting: Family Medicine

## 2018-05-22 NOTE — Telephone Encounter (Signed)
Will get this next visit she is here

## 2018-06-21 ENCOUNTER — Encounter: Payer: Self-pay | Admitting: Podiatry

## 2018-06-21 ENCOUNTER — Other Ambulatory Visit: Payer: Self-pay | Admitting: Podiatry

## 2018-06-21 ENCOUNTER — Ambulatory Visit (INDEPENDENT_AMBULATORY_CARE_PROVIDER_SITE_OTHER): Payer: BLUE CROSS/BLUE SHIELD

## 2018-06-21 ENCOUNTER — Ambulatory Visit (INDEPENDENT_AMBULATORY_CARE_PROVIDER_SITE_OTHER): Payer: BLUE CROSS/BLUE SHIELD | Admitting: Podiatry

## 2018-06-21 DIAGNOSIS — M722 Plantar fascial fibromatosis: Secondary | ICD-10-CM

## 2018-06-21 DIAGNOSIS — M79672 Pain in left foot: Secondary | ICD-10-CM

## 2018-06-21 MED ORDER — TRIAMCINOLONE ACETONIDE 10 MG/ML IJ SUSP
10.0000 mg | Freq: Once | INTRAMUSCULAR | Status: AC
  Administered 2018-06-21: 10 mg

## 2018-06-21 NOTE — Progress Notes (Signed)
Subjective:   Patient ID: Becky Lara, female   DOB: 62 y.o.   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  Reappoint 6 weeks or earlier if needed and also use hot compresses  X-ray indicates no calcification of the mass appears to be soft tissue

## 2018-07-03 NOTE — Progress Notes (Signed)
Subjective:    Patient ID: Becky Lara, female    DOB: 12/14/1955, 62 y.o.   MRN: [id number]  HPI Chief Complaint  Patient presents with  . fasting cpe    fasting cpe. having bad muscle cramps. getting eyes checked later this year. no other concerns. will get flu shot today   She is here for a complete physical exam and to follow up on chronic health conditions.  Last CPE: 12/2016  Complains of intermittent bilateral thigh, calf and feet cramping for the past year. States this happens after being on her feet at work for 12 hours.  States heating pad improves her symptoms. No numbness, tingling or weakness.  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 She works 5 shifts one week and 2 the next. She does not do any activities on her off days and no cramping then.   Other providers: Dr. Allyson Sabal- Dermatologist  Dr. Henrene Pastor - GI Dr. Jimmye Norman- Dentist    HTN- does not check BP at home. Reports poor compliance with BP medicatons. Takes it 3 days.  Taking HCTZ and amlodipine.   Statin- stopped taking simvastatin   Gout- takes allopurinol daily   GERD- has been taking Tums and is having more problems.   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  Denies smoking cigarettes. Drinks alcohol occasionally.  Smoking marijuana still.  Diet: fairly unhealthy  Excerise: nothing lately   Immunizations: Tdap 2017  Health maintenance:  Mammogram: 01/30/2018 Colonoscopy: 01/26/2016 Last Gynecological Exam: 11/2015 and normal.   Last Dental Exam: last few months  Last Eye Exam: in the past 2 years Wears seatbelt always, smoke detectors in home and functioning, does not text while driving and feels safe in home environment.   Reviewed allergies, medications, past medical, surgical, family, and social history.   Review of Systems Review of Systems Constitutional: -fever, -chills,  -sweats, -unexpected weight change,-fatigue ENT: -runny nose, -ear pain, -sore throat Cardiology:  -chest pain, -palpitations, -edema Respiratory: -cough, -shortness of breath, -wheezing Gastroenterology: -abdominal pain, -nausea, -vomiting, -diarrhea, -constipation  Hematology: -bleeding or bruising problems Musculoskeletal: -arthralgias, -myalgias, -joint swelling, -back pain Ophthalmology: -vision changes Urology: -dysuria, -difficulty urinating, -hematuria, -urinary frequency, -urgency Neurology: -headache, -weakness, -tingling, -numbness       Objective:   Physical Exam BP 110/70   Pulse 66   Ht 5' 6.25" (1.683 m)   Wt 206 lb 6.4 oz (93.6 kg)   LMP 04/26/2008   BMI 33.06 kg/m   General Appearance:    Alert, cooperative, no distress, appears stated age  Head:    Normocephalic, without obvious abnormality, atraumatic  Eyes:    PERRL, conjunctiva/corneas clear, EOM's intact, fundi    benign  Ears:    Normal TM's and external ear canals  Nose:   Nares normal, mucosa normal, no drainage or sinus   tenderness  Throat:   Lips, mucosa, and tongue normal; teeth and gums normal  Neck:   Supple, no lymphadenopathy;  thyroid:  no   enlargement/tenderness/nodules; no carotid   bruit or JVD  Back:    Spine nontender, no curvature, ROM normal, no CVA     tenderness  Lungs:     Clear to auscultation bilaterally without wheezes, rales or     ronchi; respirations unlabored  Chest Wall:    No tenderness or deformity   Heart:    Regular rate and  rhythm, S1 and S2 normal, no murmur, rub   or gallop  Breast Exam:    Declines. Mammogram up to date   Abdomen:     Soft, non-tender, nondistended, normoactive bowel sounds,    no masses, no hepatosplenomegaly  Genitalia:    Declines. Pap up to date     Extremities:   No clubbing, cyanosis or edema  Pulses:   2+ and symmetric all extremities  Skin:   Skin color, texture, turgor normal, no rashes or lesions  Lymph nodes:   Cervical,  supraclavicular, and axillary nodes normal  Neurologic:   CNII-XII intact, normal strength, sensation and gait; reflexes 2+ and symmetric throughout          Psych:   Normal mood, affect, hygiene and grooming.     Urinalysis dipstick: negative      Assessment & Plan:  Routine general medical examination at a health care facility - Plan: POCT Urinalysis DIP (Proadvantage Device), CBC with Differential/Platelet, Comprehensive metabolic panel, TSH, T4, free, Lipid panel  Essential hypertension - Plan: CBC with Differential/Platelet, Comprehensive metabolic panel  Gastroesophageal reflux disease, esophagitis presence not specified - Plan: esomeprazole (NEXIUM) 40 MG capsule  Idiopathic chronic gout without tophus, unspecified site - Plan: Uric acid  Elevated LDL cholesterol level - Plan: Lipid panel  Needs flu shot - Plan: Flu Vaccine QUAD 36+ mos IM  Obesity (BMI 30-39.9) - Plan: TSH, T4, free, Lipid panel, Hemoglobin A1c  Medication management - Plan: Lipid panel, Uric acid  Immunization deficiency - Plan: Hepatitis A vaccine adult IM  Counseling for travel - Plan: Hepatitis A vaccine adult IM  Bilateral leg cramps - Plan: CBC with Differential/Platelet, Comprehensive metabolic panel, TSH, T4, free, Magnesium  She is in good spirits today.  Leg cramps on days when she works 12 hour shifts. Does not drink enough water.  Encouraged her to increase water intake and to be more active on her days off.  Also discussed taking a warm bath after she gets off her 12-hour shift and stretching.  We demonstrated stretching in the office.  We will check labs and follow-up Hypertension -she reports poor compliance on medication and does not like increased urination with diuretic.  Her blood pressure is in goal range today.  Stop HCTZ and continue on amlodipine. Check BP at home.   GERD-reports Dexilant worked for her in the past.  Currently taking Tums which is not working.  Prescribed Nexium and  she will take this as needed.  Counseling done on lifestyle modifications to control GERD  Elevated LDL- she is not taking her statin. No specific reason given. Check lipids and follow up.  Counseling done on healthy diet and increasing physical activity  Gout-she is taking daily allopurinol.  No recent flares.  Check uric acid level  Plans to travel to [place] and to [place] next year.  Discussed immunizations and she would like hepatitis A vaccine today.  She will return in 6 months for the second hep A vaccine.  She will check with her insurance company regarding Shingrix and let us know if she decides to get this. Flu shot given Follow-up pending labs

## 2018-07-04 ENCOUNTER — Ambulatory Visit (INDEPENDENT_AMBULATORY_CARE_PROVIDER_SITE_OTHER): Payer: BLUE CROSS/BLUE SHIELD | Admitting: Family Medicine

## 2018-07-04 ENCOUNTER — Encounter: Payer: Self-pay | Admitting: Family Medicine

## 2018-07-04 VITALS — BP 110/70 | HR 66 | Ht 66.25 in | Wt 206.4 lb

## 2018-07-04 DIAGNOSIS — I10 Essential (primary) hypertension: Secondary | ICD-10-CM | POA: Diagnosis not present

## 2018-07-04 DIAGNOSIS — E669 Obesity, unspecified: Secondary | ICD-10-CM

## 2018-07-04 DIAGNOSIS — Z79899 Other long term (current) drug therapy: Secondary | ICD-10-CM | POA: Diagnosis not present

## 2018-07-04 DIAGNOSIS — Z23 Encounter for immunization: Secondary | ICD-10-CM

## 2018-07-04 DIAGNOSIS — Z2839 Other underimmunization status: Secondary | ICD-10-CM

## 2018-07-04 DIAGNOSIS — Z283 Underimmunization status: Secondary | ICD-10-CM | POA: Diagnosis not present

## 2018-07-04 DIAGNOSIS — K219 Gastro-esophageal reflux disease without esophagitis: Secondary | ICD-10-CM | POA: Diagnosis not present

## 2018-07-04 DIAGNOSIS — Z0000 Encounter for general adult medical examination without abnormal findings: Secondary | ICD-10-CM

## 2018-07-04 DIAGNOSIS — Z7189 Other specified counseling: Secondary | ICD-10-CM

## 2018-07-04 DIAGNOSIS — Z7184 Encounter for health counseling related to travel: Secondary | ICD-10-CM

## 2018-07-04 DIAGNOSIS — M1A00X0 Idiopathic chronic gout, unspecified site, without tophus (tophi): Secondary | ICD-10-CM

## 2018-07-04 DIAGNOSIS — R252 Cramp and spasm: Secondary | ICD-10-CM | POA: Diagnosis not present

## 2018-07-04 DIAGNOSIS — E7800 Pure hypercholesterolemia, unspecified: Secondary | ICD-10-CM

## 2018-07-04 LAB — POCT URINALYSIS DIP (PROADVANTAGE DEVICE)
Bilirubin, UA: NEGATIVE
Glucose, UA: NEGATIVE mg/dL
Ketones, POC UA: NEGATIVE mg/dL
Leukocytes, UA: NEGATIVE
Nitrite, UA: NEGATIVE
PROTEIN UA: NEGATIVE mg/dL
RBC UA: NEGATIVE
SPECIFIC GRAVITY, URINE: 1.015
UUROB: NEGATIVE
pH, UA: 6 (ref 5.0–8.0)

## 2018-07-04 MED ORDER — ESOMEPRAZOLE MAGNESIUM 40 MG PO CPDR: 40.0000 mg | DELAYED_RELEASE_CAPSULE | Freq: Every day | ORAL | 3 refills | Status: DC

## 2018-07-04 NOTE — Patient Instructions (Addendum)
Buy a blood pressure cuff and check your BP at home. Goal reading is <130/80.   Stop the HCTZ for now. Continue on amlodipine.   For your leg cramps and blood pressure- Cut back on salt. Increase your water. Walk more on your days off. Do the stretches we discussed.   Return in 6 months (March 2020) for your second Hepatitis A vaccine.   Call your insurance company to see about the Shingles vaccine (Shingrix). Schedule a nurse visit if you decide to get this. It is a 2 shot series.   Take the Nexium only as needed and not daily. Try to avoid foods that trigger your reflux. Do not eat large meals. Avoid eating and laying down.   Preventative Care for Adults - Female      MAINTAIN REGULAR HEALTH EXAMS:  A routine yearly physical is a good way to check in with your primary care provider about your health and preventive screening. It is also an opportunity to share updates about your health and any concerns you have, and receive a thorough all-over exam.   Most health insurance companies pay for at least some preventative services.  Check with your health plan for specific coverages.  WHAT PREVENTATIVE SERVICES DO WOMEN NEED?  Adult women should have their weight and blood pressure checked regularly.   Women age 38 and older should have their cholesterol levels checked regularly.  Women should be screened for cervical cancer with a Pap smear and pelvic exam beginning at either age 17, or 3 years after they become sexually activity.    Breast cancer screening generally begins at age 84 with a mammogram and breast exam by your primary care provider.    Beginning at age 107 and continuing to age 71, women should be screened for colorectal cancer.  Certain people may need continued testing until age 71.  Updating vaccinations is part of preventative care.  Vaccinations help protect against diseases such as the flu.  Osteoporosis is a disease in which the bones lose minerals and strength as  we age. Women ages 71 and over should discuss this with their caregivers, as should women after menopause who have other risk factors.  Lab tests are generally done as part of preventative care to screen for anemia and blood disorders, to screen for problems with the kidneys and liver, to screen for bladder problems, to check blood sugar, and to check your cholesterol level.  Preventative services generally include counseling about diet, exercise, avoiding tobacco, drugs, excessive alcohol consumption, and sexually transmitted infections.    GENERAL RECOMMENDATIONS FOR GOOD HEALTH:  Healthy diet:  Eat a variety of foods, including fruit, vegetables, animal or vegetable protein, such as meat, fish, chicken, and eggs, or beans, lentils, tofu, and grains, such as rice.  Drink plenty of water daily.  Decrease saturated fat in the diet, avoid lots of red meat, processed foods, sweets, fast foods, and fried foods.  Exercise:  Aerobic exercise helps maintain good heart health. At least 30-40 minutes of moderate-intensity exercise is recommended. For example, a brisk walk that increases your heart rate and breathing. This should be done on most days of the week.   Find a type of exercise or a variety of exercises that you enjoy so that it becomes a part of your daily life.  Examples are running, walking, swimming, water aerobics, and biking.  For motivation and support, explore group exercise such as aerobic class, spin class, Zumba, Yoga,or  martial arts, etc.  Set exercise goals for yourself, such as a certain weight goal, walk or run in a race such as a 5k walk/run.  Speak to your primary care provider about exercise goals.  Disease prevention:  If you smoke or chew tobacco, find out from your caregiver how to quit. It can literally save your life, no matter how long you have been a tobacco user. If you do not use tobacco, never begin.   Maintain a healthy diet and normal weight. Increased  weight leads to problems with blood pressure and diabetes.   The Body Mass Index or BMI is a way of measuring how much of your body is fat. Having a BMI above 27 increases the risk of heart disease, diabetes, hypertension, stroke and other problems related to obesity. Your caregiver can help determine your BMI and based on it develop an exercise and dietary program to help you achieve or maintain this important measurement at a healthful level.  High blood pressure causes heart and blood vessel problems.  Persistent high blood pressure should be treated with medicine if weight loss and exercise do not work.   Fat and cholesterol leaves deposits in your arteries that can block them. This causes heart disease and vessel disease elsewhere in your body.  If your cholesterol is found to be high, or if you have heart disease or certain other medical conditions, then you may need to have your cholesterol monitored frequently and be treated with medication.   Ask if you should have a cardiac stress test if your history suggests this. A stress test is a test done on a treadmill that looks for heart disease. This test can find disease prior to there being a problem.  Menopause can be associated with physical symptoms and risks. Hormone replacement therapy is available to decrease these. You should talk to your caregiver about whether starting or continuing to take hormones is right for you.   Osteoporosis is a disease in which the bones lose minerals and strength as we age. This can result in serious bone fractures. Risk of osteoporosis can be identified using a bone density scan. Women ages 79 and over should discuss this with their caregivers, as should women after menopause who have other risk factors. Ask your caregiver whether you should be taking a calcium supplement and Vitamin D, to reduce the rate of osteoporosis.   Avoid drinking alcohol in excess (more than two drinks per day).  Avoid use of street  drugs. Do not share needles with anyone. Ask for professional help if you need assistance or instructions on stopping the use of alcohol, cigarettes, and/or drugs.  Brush your teeth twice a day with fluoride toothpaste, and floss once a day. Good oral hygiene prevents tooth decay and gum disease. The problems can be painful, unattractive, and can cause other health problems. Visit your dentist for a routine oral and dental check up and preventive care every 6-12 months.   Look at your skin regularly.  Use a mirror to look at your back. Notify your caregivers of changes in moles, especially if there are changes in shapes, colors, a size larger than a pencil eraser, an irregular border, or development of new moles.  Safety:  Use seatbelts 100% of the time, whether driving or as a passenger.  Use safety devices such as hearing protection if you work in environments with loud noise or significant background noise.  Use safety glasses when doing any work that could send debris in to the  eyes.  Use a helmet if you ride a bike or motorcycle.  Use appropriate safety gear for contact sports.  Talk to your caregiver about gun safety.  Use sunscreen with a SPF (or skin protection factor) of 15 or greater.  Lighter skinned people are at a greater risk of skin cancer. Don't forget to also wear sunglasses in order to protect your eyes from too much damaging sunlight. Damaging sunlight can accelerate cataract formation.   Practice safe sex. Use condoms. Condoms are used for birth control and to help reduce the spread of sexually transmitted infections (or STIs).  Some of the STIs are gonorrhea (the clap), chlamydia, syphilis, trichomonas, herpes, HPV (human papilloma virus) and HIV (human immunodeficiency virus) which causes AIDS. The herpes, HIV and HPV are viral illnesses that have no cure. These can result in disability, cancer and death.   Keep carbon monoxide and smoke detectors in your home functioning at all  times. Change the batteries every 6 months or use a model that plugs into the wall.   Vaccinations:  Stay up to date with your tetanus shots and other required immunizations. You should have a booster for tetanus every 10 years. Be sure to get your flu shot every year, since 5%-20% of the [place] population comes down with the flu. The flu vaccine changes each year, so being vaccinated once is not enough. Get your shot in the fall, before the flu season peaks.   Other vaccines to consider:  Human Papilloma Virus or HPV causes cancer of the cervix, and other infections that can be transmitted from person to person. There is a vaccine for HPV, and females should get immunized between the ages of 76 and 56. It requires a series of 3 shots.   Pneumococcal vaccine to protect against certain types of pneumonia.  This is normally recommended for adults age 14 or older.  However, adults younger than 63 years old with certain underlying conditions such as diabetes, heart or lung disease should also receive the vaccine.  Shingles vaccine to protect against Varicella Zoster if you are older than age 42, or younger than 63 years old with certain underlying illness.  Hepatitis A vaccine to protect against a form of infection of the liver by a virus acquired from food.  Hepatitis B vaccine to protect against a form of infection of the liver by a virus acquired from blood or body fluids, particularly if you work in health care.  If you plan to travel internationally, check with your local health department for specific vaccination recommendations.  Cancer Screening:  Breast cancer screening is essential to preventive care for women. All women age 25 and older should perform a breast self-exam every month. At age 23 and older, women should have their caregiver complete a breast exam each year. Women at ages 88 and older should have a mammogram (x-ray film) of the breasts. Your caregiver can discuss how often you  need mammograms.    Cervical cancer screening includes taking a Pap smear (sample of cells examined under a microscope) from the cervix (end of the uterus). It also includes testing for HPV (Human Papilloma Virus, which can cause cervical cancer). Screening and a pelvic exam should begin at age 19, or 3 years after a woman becomes sexually active. Screening should occur every year, with a Pap smear but no HPV testing, up to age 50. After age 71, you should have a Pap smear every 3 years with HPV testing, if no HPV  was found previously.   Most routine colon cancer screening begins at the age of 78. On a yearly basis, doctors may provide special easy to use take-home tests to check for hidden blood in the stool. Sigmoidoscopy or colonoscopy can detect the earliest forms of colon cancer and is life saving. These tests use a small camera at the end of a tube to directly examine the colon. Speak to your caregiver about this at age 84, when routine screening begins (and is repeated every 5 years unless early forms of pre-cancerous polyps or small growths are found).

## 2018-07-05 ENCOUNTER — Other Ambulatory Visit (INDEPENDENT_AMBULATORY_CARE_PROVIDER_SITE_OTHER): Payer: BLUE CROSS/BLUE SHIELD

## 2018-07-05 DIAGNOSIS — Z23 Encounter for immunization: Secondary | ICD-10-CM | POA: Diagnosis not present

## 2018-07-05 LAB — CBC WITH DIFFERENTIAL/PLATELET
BASOS ABS: 0 10*3/uL (ref 0.0–0.2)
Basos: 1 %
EOS (ABSOLUTE): 0.2 10*3/uL (ref 0.0–0.4)
Eos: 3 %
Hematocrit: 42.5 % (ref 34.0–46.6)
Hemoglobin: 14 g/dL (ref 11.1–15.9)
IMMATURE GRANULOCYTES: 0 %
Immature Grans (Abs): 0 10*3/uL (ref 0.0–0.1)
LYMPHS ABS: 1.6 10*3/uL (ref 0.7–3.1)
Lymphs: 24 %
MCH: 29 pg (ref 26.6–33.0)
MCHC: 32.9 g/dL (ref 31.5–35.7)
MCV: 88 fL (ref 79–97)
MONOS ABS: 0.4 10*3/uL (ref 0.1–0.9)
Monocytes: 7 %
NEUTROS PCT: 65 %
Neutrophils Absolute: 4.5 10*3/uL (ref 1.4–7.0)
PLATELETS: 167 10*3/uL (ref 150–450)
RBC: 4.83 x10E6/uL (ref 3.77–5.28)
RDW: 12.4 % (ref 12.3–15.4)
WBC: 6.8 10*3/uL (ref 3.4–10.8)

## 2018-07-05 LAB — COMPREHENSIVE METABOLIC PANEL
A/G RATIO: 1.3 (ref 1.2–2.2)
ALK PHOS: 68 IU/L (ref 39–117)
ALT: 13 IU/L (ref 0–32)
AST: 14 IU/L (ref 0–40)
Albumin: 4.4 g/dL (ref 3.6–4.8)
BUN/Creatinine Ratio: 17 (ref 12–28)
BUN: 19 mg/dL (ref 8–27)
Bilirubin Total: 0.6 mg/dL (ref 0.0–1.2)
CALCIUM: 9.6 mg/dL (ref 8.7–10.3)
CHLORIDE: 100 mmol/L (ref 96–106)
CO2: 24 mmol/L (ref 20–29)
Creatinine, Ser: 1.09 mg/dL — ABNORMAL HIGH (ref 0.57–1.00)
GFR calc Af Amer: 62 mL/min/{1.73_m2} (ref >59)
GFR calc non Af Amer: 54 mL/min/{1.73_m2} — ABNORMAL LOW (ref >59)
GLOBULIN, TOTAL: 3.3 g/dL (ref 1.5–4.5)
Glucose: 96 mg/dL (ref 65–99)
POTASSIUM: 3.6 mmol/L (ref 3.5–5.2)
SODIUM: 139 mmol/L (ref 134–144)
Total Protein: 7.7 g/dL (ref 6.0–8.5)

## 2018-07-05 LAB — LIPID PANEL
CHOLESTEROL TOTAL: 196 mg/dL (ref 100–199)
Chol/HDL Ratio: 2.1 ratio (ref 0.0–4.4)
HDL: 95 mg/dL (ref >39)
LDL Calculated: 89 mg/dL (ref 0–99)
Triglycerides: 59 mg/dL (ref 0–149)
VLDL CHOLESTEROL CAL: 12 mg/dL (ref 5–40)

## 2018-07-05 LAB — HEMOGLOBIN A1C
ESTIMATED AVERAGE GLUCOSE: 91 mg/dL
HEMOGLOBIN A1C: 4.8 % (ref 4.8–5.6)

## 2018-07-05 LAB — MAGNESIUM: MAGNESIUM: 2.2 mg/dL (ref 1.6–2.3)

## 2018-07-05 LAB — URIC ACID: Uric Acid: 4.7 mg/dL (ref 2.5–7.1)

## 2018-07-05 LAB — TSH: TSH: 0.914 u[IU]/mL (ref 0.450–4.500)

## 2018-07-05 LAB — T4, FREE: FREE T4: 1.4 ng/dL (ref 0.82–1.77)

## 2018-07-21 DIAGNOSIS — L2084 Intrinsic (allergic) eczema: Secondary | ICD-10-CM | POA: Diagnosis not present

## 2018-08-01 DIAGNOSIS — H35033 Hypertensive retinopathy, bilateral: Secondary | ICD-10-CM | POA: Diagnosis not present

## 2018-08-04 ENCOUNTER — Ambulatory Visit: Payer: BLUE CROSS/BLUE SHIELD | Admitting: Podiatry

## 2018-08-20 ENCOUNTER — Other Ambulatory Visit: Payer: Self-pay | Admitting: Family Medicine

## 2018-08-24 ENCOUNTER — Ambulatory Visit (INDEPENDENT_AMBULATORY_CARE_PROVIDER_SITE_OTHER): Payer: BLUE CROSS/BLUE SHIELD | Admitting: Podiatry

## 2018-08-24 ENCOUNTER — Ambulatory Visit (INDEPENDENT_AMBULATORY_CARE_PROVIDER_SITE_OTHER): Payer: BLUE CROSS/BLUE SHIELD

## 2018-08-24 ENCOUNTER — Encounter: Payer: Self-pay | Admitting: Podiatry

## 2018-08-24 ENCOUNTER — Other Ambulatory Visit: Payer: Self-pay | Admitting: Podiatry

## 2018-08-24 DIAGNOSIS — M79672 Pain in left foot: Secondary | ICD-10-CM

## 2018-08-24 DIAGNOSIS — M722 Plantar fascial fibromatosis: Secondary | ICD-10-CM

## 2018-08-24 NOTE — Progress Notes (Signed)
Subjective:   Patient ID: Becky Lara, female   DOB: 62 y.o.   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  Patient will be seen back for [place] to recheck

## 2018-08-25 ENCOUNTER — Other Ambulatory Visit: Payer: Self-pay | Admitting: Family Medicine

## 2018-08-25 DIAGNOSIS — M1A00X0 Idiopathic chronic gout, unspecified site, without tophus (tophi): Secondary | ICD-10-CM

## 2018-09-04 ENCOUNTER — Other Ambulatory Visit: Payer: Self-pay | Admitting: Family Medicine

## 2018-09-04 DIAGNOSIS — I10 Essential (primary) hypertension: Secondary | ICD-10-CM

## 2018-09-26 ENCOUNTER — Ambulatory Visit: Payer: BLUE CROSS/BLUE SHIELD | Admitting: Family Medicine

## 2018-10-09 ENCOUNTER — Encounter: Payer: Self-pay | Admitting: Family Medicine

## 2018-10-09 ENCOUNTER — Ambulatory Visit (INDEPENDENT_AMBULATORY_CARE_PROVIDER_SITE_OTHER): Payer: BLUE CROSS/BLUE SHIELD | Admitting: Family Medicine

## 2018-10-09 VITALS — BP 120/80 | HR 68 | Wt 208.6 lb

## 2018-10-09 DIAGNOSIS — I10 Essential (primary) hypertension: Secondary | ICD-10-CM | POA: Diagnosis not present

## 2018-10-09 DIAGNOSIS — M1A00X0 Idiopathic chronic gout, unspecified site, without tophus (tophi): Secondary | ICD-10-CM | POA: Diagnosis not present

## 2018-10-09 NOTE — Progress Notes (Signed)
   Subjective:    Patient ID: Becky Lara, female    DOB: Feb 07, 1956, 62 y.o.   MRN: [id number]  HPI Chief Complaint  Patient presents with  . 3 month follow-up    3 month follow-up on bp. due for 2nd shingrix but will get it in march when she is due for 2nd hep A   She is here for 47-month follow-up on hypertension.  She stopped HCTZ due to urinary frequency and has been taking daily amlodipine.  Does not check her blood pressure at home. States she has been eating a healthier diet.  Denies fever, chills, dizziness, headache, chest pain, palpitations, shortness of breath, N/V/D, LE edema.   States she stopped smoking marijuana 2 months ago and is feeling good about this.  Reports currently taking nightly simvastatin.  No arthralgias or myalgias.  Also reports taking daily allopurinol 300 mg.  Most recent uric acid level was in normal range.  No flares.  She is planning to travel to [place] next month and is also scheduling a trip to [place] next year.  She will return for her second hep A and Shingrix vaccines.  Requests a handicap placard since others have this. She seems serious about the request but is joking as well.   Reviewed allergies, medications, past medical, surgical, family, and social history.    Review of Systems Pertinent positives and negatives in the history of present illness.     Objective:   Physical Exam BP 120/80   Pulse 68   Wt 208 lb 9.6 oz (94.6 kg)   LMP 04/26/2008   BMI 33.42 kg/m   Alert and oriented and in no acute distress. Normal work of breathing.  Extremities without edema.  Mood, speech and thought process all normal.      Assessment & Plan:  Essential hypertension  Idiopathic chronic gout without tophus, unspecified site  Her blood pressure is currently within goal range.  She will continue on daily amlodipine.  No need to start her back on HCTZ and she is happy about this. She will continue on low-sodium diet.  Congratulated her  on stopping marijuana. Seems to be doing well on her statin and allopurinol. She will return for fasting CPE when she is due next March. Discussed that she does not meet criteria for a handicap placard and she is fine with this.

## 2018-10-23 ENCOUNTER — Ambulatory Visit (INDEPENDENT_AMBULATORY_CARE_PROVIDER_SITE_OTHER): Payer: BLUE CROSS/BLUE SHIELD | Admitting: Family Medicine

## 2018-10-23 ENCOUNTER — Encounter: Payer: Self-pay | Admitting: Family Medicine

## 2018-10-23 VITALS — BP 130/80 | Temp 97.8°F | Ht 65.5 in | Wt 203.2 lb

## 2018-10-23 DIAGNOSIS — R509 Fever, unspecified: Secondary | ICD-10-CM

## 2018-10-23 DIAGNOSIS — R197 Diarrhea, unspecified: Secondary | ICD-10-CM

## 2018-10-23 DIAGNOSIS — J101 Influenza due to other identified influenza virus with other respiratory manifestations: Secondary | ICD-10-CM | POA: Diagnosis not present

## 2018-10-23 DIAGNOSIS — R059 Cough, unspecified: Secondary | ICD-10-CM

## 2018-10-23 DIAGNOSIS — R1110 Vomiting, unspecified: Secondary | ICD-10-CM | POA: Diagnosis not present

## 2018-10-23 DIAGNOSIS — R05 Cough: Secondary | ICD-10-CM

## 2018-10-23 LAB — POC INFLUENZA A&B (BINAX/QUICKVUE)
INFLUENZA A, POC: NEGATIVE
Influenza B, POC: POSITIVE — AB

## 2018-10-23 MED ORDER — BENZONATATE 200 MG PO CAPS: 200.0000 mg | ORAL_CAPSULE | Freq: Three times a day (TID) | ORAL | 0 refills | Status: DC | PRN

## 2018-10-23 NOTE — Patient Instructions (Signed)
Drink plenty of water. Avoid milk/cheese/dairy for at least a few day. Stick with a bland diet--bananas, rice, toast (avoid spicy, greasy/fried)--chicken noodle soup. You may continue theraflu, if helping, vs changing to coricidin HBP (safer for people with high blood pressure, but your BP was okay today). I'm prescribing benzonatate which is a cough suppressant pill. Use this if needed for cough. You may continue to use mucinex and/or dextromethorphan (likely in your theraflu) for cough, if needed.  Please do not exceed taking more than FOUR ibuprofen at once--this is the prescription strength, and more can potentially cause more harm than good. You may use tylenol (if not in theraflu--check for acetaminophen) also for pain or aches.  Return if you develop any recurrent high fever, worsening cough, shortness of breath, discolored mucus or phlegm. Return if you continue to have stomach issues--any blood or mucus in the stool, if you can't keep fluids down and are getting dehydrated (dizzy, dry mouth, very little/dark urine).    Influenza, Adult Influenza is also called "the flu." It is an infection in the lungs, nose, and throat (respiratory tract). It is caused by a virus. The flu causes symptoms that are similar to symptoms of a cold. It also causes a high fever and body aches. The flu spreads easily from person to person (is contagious). Getting a flu shot (influenza vaccination) every year is the best way to prevent the flu. What are the causes? This condition is caused by the influenza virus. You can get the virus by:  Breathing in droplets that are in the air from the cough or sneeze of a person who has the virus.  Touching something that has the virus on it (is contaminated) and then touching your mouth, nose, or eyes. What increases the risk? Certain things may make you more likely to get the flu. These include:  Not washing your hands often.  Having close contact with many  people during cold and flu season.  Touching your mouth, eyes, or nose without first washing your hands.  Not getting a flu shot every year. You may have a higher risk for the flu, along with serious problems such as a lung infection (pneumonia), if you:  Are older than 65.  Are pregnant.  Have a weakened disease-fighting system (immune system) because of a disease or taking certain medicines.  Have a long-term (chronic) illness, such as: ? Heart, kidney, or lung disease. ? Diabetes. ? Asthma.  Have a liver disorder.  Are very overweight (morbidly obese).  Have anemia. This is a condition that affects your red blood cells. What are the signs or symptoms? Symptoms usually begin suddenly and last 4-14 days. They may include:  Fever and chills.  Headaches, body aches, or muscle aches.  Sore throat.  Cough.  Runny or stuffy (congested) nose.  Chest discomfort.  Not wanting to eat as much as normal (poor appetite).  Weakness or feeling tired (fatigue).  Dizziness.  Feeling sick to your stomach (nauseous) or throwing up (vomiting). How is this treated? If the flu is found early, you can be treated with medicine that can help reduce how bad the illness is and how long it lasts (antiviral medicine). This may be given by mouth (orally) or through an IV tube. Taking care of yourself at home can help your symptoms get better. Your doctor may suggest:  Taking over-the-counter medicines.  Drinking plenty of fluids. The flu often goes away on its own. If you have very bad symptoms or  other problems, you may be treated in a hospital. Follow these instructions at home:     Activity  Rest as needed. Get plenty of sleep.  Stay home from work or school as told by your doctor. ? Do not leave home until you do not have a fever for 24 hours without taking medicine. ? Leave home only to visit your doctor. Eating and drinking  Take an ORS (oral rehydration solution). This is  a drink that is sold at pharmacies and stores.  Drink enough fluid to keep your pee (urine) pale yellow.  Drink clear fluids in small amounts as you are able. Clear fluids include: ? Water. ? Ice chips. ? Fruit juice that has water added (diluted fruit juice). ? Low-calorie sports drinks.  Eat bland, easy-to-digest foods in small amounts as you are able. These foods include: ? Bananas. ? Applesauce. ? Rice. ? Lean meats. ? Toast. ? Crackers.  Do not eat or drink: ? Fluids that have a lot of sugar or caffeine. ? Alcohol. ? Spicy or fatty foods. General instructions  Take over-the-counter and prescription medicines only as told by your doctor.  Use a cool mist humidifier to add moisture to the air in your home. This can make it easier for you to breathe.  Cover your mouth and nose when you cough or sneeze.  Wash your hands with soap and water often, especially after you cough or sneeze. If you cannot use soap and water, use alcohol-based hand sanitizer.  Keep all follow-up visits as told by your doctor. This is important. How is this prevented?   Get a flu shot every year. You may get the flu shot in late summer, fall, or winter. Ask your doctor when you should get your flu shot.  Avoid contact with people who are sick during fall and winter (cold and flu season). Contact a doctor if:  You get new symptoms.  You have: ? Chest pain. ? Watery poop (diarrhea). ? A fever.  Your cough gets worse.  You start to have more mucus.  You feel sick to your stomach.  You throw up. Get help right away if you:  Have shortness of breath.  Have trouble breathing.  Have skin or nails that turn a bluish color.  Have very bad pain or stiffness in your neck.  Get a sudden headache.  Get sudden pain in your face or ear.  Cannot eat or drink without throwing up. Summary  Influenza ("the flu") is an infection in the lungs, nose, and throat. It is caused by a  virus.  Take over-the-counter and prescription medicines only as told by your doctor.  Getting a flu shot every year is the best way to avoid getting the flu. This information is not intended to replace advice given to you by your health care provider. Make sure you discuss any questions you have with your health care provider. Document Released: 07/20/2008 Document Revised: 03/29/2018 Document Reviewed: 03/29/2018 Elsevier Interactive Patient Education  2019 [employer]   Diarrhea, Adult Diarrhea is frequent loose and watery bowel movements. Diarrhea can make you feel weak and cause you to become dehydrated. Dehydration can make you tired and thirsty, cause you to have a dry mouth, and decrease how often you urinate. Diarrhea typically lasts 2-3 days. However, it can last longer if it is a sign of something more serious. It is important to treat your diarrhea as told by your health care provider. Follow these instructions at home: Eating  and drinking     Follow these recommendations as told by your health care provider:  Take an oral rehydration solution (ORS). This is an over-the-counter medicine that helps return your body to its normal balance of nutrients and water. It is found at pharmacies and retail stores.  Drink plenty of fluids, such as water, ice chips, diluted fruit juice, and low-calorie sports drinks. You can drink milk also, if desired.  Avoid drinking fluids that contain a lot of sugar or caffeine, such as energy drinks, sports drinks, and soda.  Eat bland, easy-to-digest foods in small amounts as you are able. These foods include bananas, applesauce, rice, lean meats, toast, and crackers.  Avoid alcohol.  Avoid spicy or fatty foods.  Medicines  Take over-the-counter and prescription medicines only as told by your health care provider.  If you were prescribed an antibiotic medicine, take it as told by your health care provider. Do not stop using the antibiotic  even if you start to feel better. General instructions   Wash your hands often using soap and water. If soap and water are not available, use a hand sanitizer. Others in the household should wash their hands as well. Hands should be washed: ? After using the toilet or changing a diaper. ? Before preparing, cooking, or serving food. ? While caring for a sick person or while visiting someone in a hospital.  Drink enough fluid to keep your urine pale yellow.  Rest at home while you recover.  Watch your condition for any changes.  Take a warm bath to relieve any burning or pain from frequent diarrhea episodes.  Keep all follow-up visits as told by your health care provider. This is important. Contact a health care provider if:  You have a fever.  Your diarrhea gets worse.  You have new symptoms.  You cannot keep fluids down.  You feel light-headed or dizzy.  You have a headache.  You have muscle cramps. Get help right away if:  You have chest pain.  You feel extremely weak or you faint.  You have bloody or black stools or stools that look like tar.  You have severe pain, cramping, or bloating in your abdomen.  You have trouble breathing or you are breathing very quickly.  Your heart is beating very quickly.  Your skin feels cold and clammy.  You feel confused.  You have signs of dehydration, such as: ? Dark urine, very little urine, or no urine. ? Cracked lips. ? Dry mouth. ? Sunken eyes. ? Sleepiness. ? Weakness. Summary  Diarrhea is frequent loose and watery bowel movements. Diarrhea can make you feel weak and cause you to become dehydrated.  Drink enough fluids to keep your urine pale yellow.  Make sure that you wash your hands after using the toilet. If soap and water are not available, use hand sanitizer.  Contact a health care provider if your diarrhea gets worse or you have new symptoms.  Get help right away if you have signs of  dehydration. This information is not intended to replace advice given to you by your health care provider. Make sure you discuss any questions you have with your health care provider. Document Released: 10/01/2002 Document Revised: 03/17/2018 Document Reviewed: 03/17/2018 Elsevier Interactive Patient Education  2019 [employer].

## 2018-10-23 NOTE — Progress Notes (Signed)
Chief Complaint  Patient presents with  . Cough    runny nose, chills, body aches and fever. Has been taking ibuprofen for this. Thinks she has the flu. Symptoms started on Christmas . Vomited one night as well. Would like to know if she does have flu even though would be too late to start Tamiflu.     Symptoms started 12/24, had fever on Christmas.  +body aches, cough, runny nose, fever/chills.  She had vomiting and diarrhea yesterday. She vomited twice (after trying to take Mucinex both times).  Diarrhea was watery, multiple times throughout the day.  No blood or mucus.  Denis any abdominal pain.  Today feels better--no nausea or diarrhea yet.  She has been taking Theraflu, and Mucinex (just didn't keep it down yesterday). She took 1000mg  of ibuprofen yesterday (at once).  She has been taking 5 at a time (1000mg ) once a day since Christmas.  Denies sick contacts  PMH, [place] [facility] reviewed.  Outpatient Encounter Medications as of 10/23/2018  Medication Sig Note  . allopurinol (ZYLOPRIM) 300 MG tablet TAKE 1 TABLET DAILY   . amLODipine (NORVASC) 10 MG tablet TAKE 1 TABLET DAILY   . aspirin EC 81 MG EC tablet Take 1 tablet (81 mg total) by mouth daily.   Marland Kitchen esomeprazole (NEXIUM) 40 MG capsule Take 1 capsule (40 mg total) by mouth daily.   Marland Kitchen ibuprofen (ADVIL,MOTRIN) 200 MG tablet Take 1,000 mg by mouth every 6 (six) hours as needed. 10/23/2018: Last dose yesterday  . simvastatin (ZOCOR) 10 MG tablet TAKE 1 TABLET AT BEDTIME   . clobetasol cream (TEMOVATE) 0.05 % APPLY TO AFFECTED AREA TWICE A DAY AS NEEDED (NOT TO FACE, GROIN, OR UNDERARMS)   . mometasone (ELOCON) 0.1 % cream Reported on 02/24/2016    No facility-administered encounter medications on file as of 10/23/2018.    No Known Allergies  ROS: +headache (top of head, no sinus pain). No ear pain. +sore throat, cough, runny nose. Denies shortness of breath or wheezing. +N/V/D per HPI. No bleeding, bruising, rash. No chest  pain.  PHYSICAL EXAM:  BP 130/80   Temp 97.8 F (36.6 C) (Tympanic)   Ht 5' 5.5" (1.664 m)   Wt 203 lb 3.2 oz (92.2 kg)   LMP 04/26/2008   BMI 33.30 kg/m   Well appearing female in no distress HEENT: PERRL, EOMI, conjunctiva and sclera are clear.   Nose: Mod edema L>R, no erythema or purulence.  Right TM partially obscured by cerumen, otherwise normal ear exam. OP is clear, torus pallatini noted superiorly Sinuses are nontender Lungs clear bilaterally Heart: regular rate and rhythm Abdomen: normal bowel sounds, soft, nontender, no mass or organomegaly. Skin: normal turgor, no rash Neuro: alert and oriented, cranial nerves intact, normal gait.   Influenza B +   ASSESSMENT/PLAN:  Influenza B - Too late in course to benefit from Tamiflu/medication. Supportive measures reviewed, as well as s/sx bacterial complication  Fever and chills - Plan: POC Influenza A&B (Binax test)  Vomiting and diarrhea - Feeling better today. Supportive measures reveiwed  Cough - Plan: benzonatate (TESSALON) 200 MG capsule   Drink plenty of water. Avoid milk/cheese/dairy for at least a few day. Stick with a bland diet--bananas, rice, toast (avoid spicy, greasy/fried)--chicken noodle soup. You may continue theraflu, if helping, vs changing to coricidin HBP (safer for people with high blood pressure, but your BP was okay today). I'm prescribing benzonatate which is a cough suppressant pill. Use this if needed for cough. You  may continue to use mucinex and/or dextromethorphan (likely in your theraflu) for cough, if needed.  Please do not exceed taking more than FOUR ibuprofen at once--this is the prescription strength, and more can potentially cause more harm than good. You may use tylenol (if not in theraflu--check for acetaminophen) also for pain or aches.  Return if you develop any recurrent high fever, worsening cough, shortness of breath, discolored mucus or phlegm. Return if you continue to  have stomach issues--any blood or mucus in the stool, if you can't keep fluids down and are getting dehydrated (dizzy, dry mouth, very little/dark urine).

## 2018-12-03 ENCOUNTER — Other Ambulatory Visit: Payer: Self-pay | Admitting: Family Medicine

## 2018-12-03 DIAGNOSIS — I10 Essential (primary) hypertension: Secondary | ICD-10-CM

## 2018-12-04 ENCOUNTER — Other Ambulatory Visit: Payer: Self-pay | Admitting: Family Medicine

## 2018-12-12 ENCOUNTER — Telehealth: Payer: Self-pay | Admitting: Internal Medicine

## 2018-12-12 NOTE — Telephone Encounter (Signed)
Due for 2nd shingrix, called pt to advise of 2nd dose due

## 2018-12-21 ENCOUNTER — Other Ambulatory Visit: Payer: Self-pay | Admitting: Family Medicine

## 2018-12-21 DIAGNOSIS — Z1231 Encounter for screening mammogram for malignant neoplasm of breast: Secondary | ICD-10-CM

## 2019-01-08 ENCOUNTER — Encounter: Payer: BLUE CROSS/BLUE SHIELD | Admitting: Family Medicine

## 2019-02-05 ENCOUNTER — Ambulatory Visit: Payer: BLUE CROSS/BLUE SHIELD

## 2019-02-16 ENCOUNTER — Other Ambulatory Visit: Payer: BLUE CROSS/BLUE SHIELD

## 2019-02-20 ENCOUNTER — Encounter: Payer: BLUE CROSS/BLUE SHIELD | Admitting: Family Medicine

## 2019-02-20 ENCOUNTER — Other Ambulatory Visit (INDEPENDENT_AMBULATORY_CARE_PROVIDER_SITE_OTHER): Payer: BLUE CROSS/BLUE SHIELD

## 2019-02-20 ENCOUNTER — Other Ambulatory Visit: Payer: Self-pay

## 2019-02-20 DIAGNOSIS — Z23 Encounter for immunization: Secondary | ICD-10-CM | POA: Diagnosis not present

## 2019-02-21 ENCOUNTER — Other Ambulatory Visit: Payer: Self-pay | Admitting: Family Medicine

## 2019-02-21 DIAGNOSIS — M1A00X0 Idiopathic chronic gout, unspecified site, without tophus (tophi): Secondary | ICD-10-CM

## 2019-02-21 NOTE — Telephone Encounter (Signed)
She needs a fasting CPE. We can put her on the schedule in 2-3 weeks if she would like to come in. Otherwise, we at least need a med check virtually if she refuses to come in and will need labs. Ok to give one refill on allopurinol for now.

## 2019-02-21 NOTE — Telephone Encounter (Signed)
Is this okay to refill? 

## 2019-02-21 NOTE — Telephone Encounter (Signed)
Left message for pt to call back and schedule CPE for 2-3 in person or do med check virtually if she does want to come in.

## 2019-03-04 ENCOUNTER — Other Ambulatory Visit: Payer: Self-pay | Admitting: Family Medicine

## 2019-03-04 DIAGNOSIS — I10 Essential (primary) hypertension: Secondary | ICD-10-CM

## 2019-03-14 ENCOUNTER — Ambulatory Visit (INDEPENDENT_AMBULATORY_CARE_PROVIDER_SITE_OTHER): Payer: BLUE CROSS/BLUE SHIELD | Admitting: Family Medicine

## 2019-03-14 ENCOUNTER — Other Ambulatory Visit: Payer: Self-pay

## 2019-03-14 ENCOUNTER — Encounter: Payer: Self-pay | Admitting: Family Medicine

## 2019-03-14 VITALS — BP 120/72 | HR 72 | Temp 97.1°F | Ht 66.0 in | Wt 202.4 lb

## 2019-03-14 DIAGNOSIS — I10 Essential (primary) hypertension: Secondary | ICD-10-CM | POA: Diagnosis not present

## 2019-03-14 DIAGNOSIS — M1A00X0 Idiopathic chronic gout, unspecified site, without tophus (tophi): Secondary | ICD-10-CM | POA: Diagnosis not present

## 2019-03-14 DIAGNOSIS — E7800 Pure hypercholesterolemia, unspecified: Secondary | ICD-10-CM | POA: Diagnosis not present

## 2019-03-14 DIAGNOSIS — K219 Gastro-esophageal reflux disease without esophagitis: Secondary | ICD-10-CM | POA: Diagnosis not present

## 2019-03-14 DIAGNOSIS — E669 Obesity, unspecified: Secondary | ICD-10-CM

## 2019-03-14 DIAGNOSIS — Z79899 Other long term (current) drug therapy: Secondary | ICD-10-CM | POA: Diagnosis not present

## 2019-03-14 MED ORDER — ATORVASTATIN CALCIUM 10 MG PO TABS: 10.0000 mg | ORAL_TABLET | Freq: Every day | ORAL | 3 refills | Status: DC

## 2019-03-14 NOTE — Patient Instructions (Addendum)
Is a pleasure seeing you today.  Your blood pressure is 120/70 and this is in goal range.  Continue on the amlodipine.  Try to eat a low-sodium diet.  Start back on the atorvastatin for your cholesterol.  I recommend that you eat a low-fat, low-cholesterol diet. It is recommended that you get at least 150 minutes of physical activity per week.  We will call you with your lab results and I will see you back in 6 months for your fasting annual exam.

## 2019-03-14 NOTE — Progress Notes (Signed)
   Subjective:    Patient ID: Becky Lara, female    DOB: 09/22/1956, 63 y.o.   MRN: [id number]  HPI Chief Complaint  Patient presents with  . Other    med check   She is scheduled for a CPE however she is not due for this. We changed this to a follow up on chronic health conditions.   Other providers: Podiatrist- Dr. Paulla Dolly  GI- Dr. Henrene Pastor  HTN- does not check BP at home. States she is taking amlodipine daily. No side effects.   GERD- Nexium 1-2 times every other month   Gout- stopped taking allopurinol last month. No recent gout flares.   Ibuprofen on days when she works due to foot pain.  She is seeing a podiatrist   Elevated LDL in the past and started on statin. States she stopped it since it said take at bedtime and her work scheduled interfered with her ability to remember.   Diet: has been unhealthy recently  Excerise: none She is still smoking marijuana   Denies fever, chills, dizziness, chest pain, palpitations, shortness of breath, abdominal pain, N/V/D, urinary symptoms, LE edema.    Reviewed allergies, medications, past medical, surgical, family, and social history.   Review of Systems Pertinent positives and negatives in the history of present illness.      Objective:   Physical Exam BP 120/72   Pulse 72   Temp (!) 97.1 F (36.2 C) (Oral)   Ht 5\' 6"  (1.676 m)   Wt 202 lb 6.4 oz (91.8 kg)   LMP 04/26/2008   BMI 32.67 kg/m   Alert and in no distress.  Cardiac exam shows a regular sinus rhythm without murmurs or gallops. Lungs are clear to auscultation. Extremities without edema.       Assessment & Plan:  Essential hypertension - Plan: CBC with Differential/Platelet, Comprehensive metabolic panel  Gastroesophageal reflux disease, esophagitis presence not specified  Obesity (BMI 30-39.9)  Idiopathic chronic gout without tophus, unspecified site - Plan: Uric Acid  Elevated LDL cholesterol level - Plan: atorvastatin (LIPITOR) 10 MG tablet,  Lipid panel  Medication management - Plan: Lipid panel, Uric Acid  Hypertension-blood pressures well controlled.  Continue on amlodipine once daily.  Counseling on eating a low-sodium diet GERD-well-controlled with avoiding triggers.  Takes Nexium as needed Gout-she is no longer on allopurinol.  She stopped this on her own.  Check uric acid level.  If she needs to start back on allopurinol she would prefer to start back at a lower dose. Elevated LDL-she stopped the atorvastatin previously due to forgetting to take it.  I will refill this and she will start back on this.  Follow up pending labs or in September when she is due for a physical

## 2019-03-15 ENCOUNTER — Other Ambulatory Visit: Payer: Self-pay | Admitting: Family Medicine

## 2019-03-15 DIAGNOSIS — M1A00X0 Idiopathic chronic gout, unspecified site, without tophus (tophi): Secondary | ICD-10-CM

## 2019-03-15 LAB — COMPREHENSIVE METABOLIC PANEL
ALT: 11 IU/L (ref 0–32)
AST: 12 IU/L (ref 0–40)
Albumin/Globulin Ratio: 1.5 (ref 1.2–2.2)
Albumin: 4.4 g/dL (ref 3.8–4.8)
Alkaline Phosphatase: 71 IU/L (ref 39–117)
BUN/Creatinine Ratio: 14 (ref 12–28)
BUN: 13 mg/dL (ref 8–27)
Bilirubin Total: 1.2 mg/dL (ref 0.0–1.2)
CO2: 21 mmol/L (ref 20–29)
Calcium: 9.3 mg/dL (ref 8.7–10.3)
Chloride: 108 mmol/L — ABNORMAL HIGH (ref 96–106)
Creatinine, Ser: 0.95 mg/dL (ref 0.57–1.00)
GFR calc Af Amer: 73 mL/min/{1.73_m2} (ref >59)
GFR calc non Af Amer: 63 mL/min/{1.73_m2} (ref >59)
Globulin, Total: 3 g/dL (ref 1.5–4.5)
Glucose: 85 mg/dL (ref 65–99)
Potassium: 4 mmol/L (ref 3.5–5.2)
Sodium: 141 mmol/L (ref 134–144)
Total Protein: 7.4 g/dL (ref 6.0–8.5)

## 2019-03-15 LAB — CBC WITH DIFFERENTIAL/PLATELET
Basophils Absolute: 0.1 10*3/uL (ref 0.0–0.2)
Basos: 1 %
EOS (ABSOLUTE): 0.3 10*3/uL (ref 0.0–0.4)
Eos: 5 %
Hematocrit: 41.9 % (ref 34.0–46.6)
Hemoglobin: 14.4 g/dL (ref 11.1–15.9)
Immature Grans (Abs): 0 10*3/uL (ref 0.0–0.1)
Immature Granulocytes: 0 %
Lymphocytes Absolute: 1.8 10*3/uL (ref 0.7–3.1)
Lymphs: 26 %
MCH: 30.4 pg (ref 26.6–33.0)
MCHC: 34.4 g/dL (ref 31.5–35.7)
MCV: 89 fL (ref 79–97)
Monocytes Absolute: 0.5 10*3/uL (ref 0.1–0.9)
Monocytes: 8 %
Neutrophils Absolute: 4 10*3/uL (ref 1.4–7.0)
Neutrophils: 60 %
Platelets: 155 10*3/uL (ref 150–450)
RBC: 4.73 x10E6/uL (ref 3.77–5.28)
RDW: 12.6 % (ref 11.7–15.4)
WBC: 6.7 10*3/uL (ref 3.4–10.8)

## 2019-03-15 LAB — LIPID PANEL
Chol/HDL Ratio: 2.3 ratio (ref 0.0–4.4)
Cholesterol, Total: 206 mg/dL — ABNORMAL HIGH (ref 100–199)
HDL: 90 mg/dL (ref >39)
LDL Calculated: 104 mg/dL — ABNORMAL HIGH (ref 0–99)
Triglycerides: 60 mg/dL (ref 0–149)
VLDL Cholesterol Cal: 12 mg/dL (ref 5–40)

## 2019-03-15 LAB — URIC ACID: Uric Acid: 6.9 mg/dL (ref 2.5–7.1)

## 2019-03-15 MED ORDER — ALLOPURINOL 100 MG PO TABS: 100.0000 mg | ORAL_TABLET | Freq: Every day | ORAL | 4 refills | Status: DC

## 2019-04-26 ENCOUNTER — Encounter: Payer: Self-pay | Admitting: Podiatry

## 2019-04-26 ENCOUNTER — Other Ambulatory Visit: Payer: Self-pay

## 2019-04-26 ENCOUNTER — Ambulatory Visit (INDEPENDENT_AMBULATORY_CARE_PROVIDER_SITE_OTHER): Payer: BLUE CROSS/BLUE SHIELD | Admitting: Podiatry

## 2019-04-26 VITALS — Temp 98.3°F

## 2019-04-26 DIAGNOSIS — M722 Plantar fascial fibromatosis: Secondary | ICD-10-CM

## 2019-04-26 NOTE — Progress Notes (Signed)
Subjective:   Patient ID: Becky Lara, female   DOB: 63 y.o.   MRN: [id number]   HPI Patient presents stating she has a nodule in the bottom of the left foot which she feels like it is grown in the last 9 months and is becoming more painful.  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  Due to the increase in size and pain I have recommended excision and patient wants this done and I explained procedure risk.  Patient wants surgery and after extensive review signed consent form understanding excision of plantar mass most likely plantar fibroma with pathology.  Understands total recovery will take age 18 weeks and final recovery will take 6 months to 1 year and it can regrow in the scenarios patient wants surgery and signed consent forms dispensed air fracture walker with all instructions on usage

## 2019-04-26 NOTE — Patient Instructions (Signed)
Pre-Operative Instructions  Congratulations, you have decided to take an important step towards improving your quality of life.  You can be assured that the doctors and staff at [facility] will be with you every step of the way.  Here are some important things you should know:  1. Plan to be at the surgery center/hospital at least 1 (one) hour prior to your scheduled time, unless otherwise directed by the surgical center/hospital staff.  You must have a responsible adult accompany you, remain during the surgery and drive you home.  Make sure you have directions to the surgical center/hospital to ensure you arrive on time. 2. If you are having surgery at [facility] or [facility] hospitals, you will need a copy of your medical history and physical form from your family physician within one month prior to the date of surgery. We will give you a form for your primary physician to complete.  3. We make every effort to accommodate the date you request for surgery.  However, there are times where surgery dates or times have to be moved.  We will contact you as soon as possible if a change in schedule is required.   4. No aspirin/ibuprofen for one week before surgery.  If you are on aspirin, any non-steroidal anti-inflammatory medications (Mobic, Aleve, Ibuprofen) should not be taken seven (7) days prior to your surgery.  You make take Tylenol for pain prior to surgery.  5. Medications - If you are taking daily heart and blood pressure medications, seizure, reflux, allergy, asthma, anxiety, pain or diabetes medications, make sure you notify the surgery center/hospital before the day of surgery so they can tell you which medications you should take or avoid the day of surgery. 6. No food or drink after midnight the night before surgery unless directed otherwise by surgical center/hospital staff. 7. No alcoholic beverages 24-hours prior to surgery.  No smoking 24-hours prior or 24-hours after  surgery. 8. Wear loose pants or shorts. They should be loose enough to fit over bandages, boots, and casts. 9. Don't wear slip-on shoes. Sneakers are preferred. 10. Bring your boot with you to the surgery center/hospital.  Also bring crutches or a walker if your physician has prescribed it for you.  If you do not have this equipment, it will be provided for you after surgery. 11. If you have not been contacted by the surgery center/hospital by the day before your surgery, call to confirm the date and time of your surgery. 12. Leave-time from work may vary depending on the type of surgery you have.  Appropriate arrangements should be made prior to surgery with your employer. 13. Prescriptions will be provided immediately following surgery by your doctor.  Fill these as soon as possible after surgery and take the medication as directed. Pain medications will not be refilled on weekends and must be approved by the doctor. 14. Remove nail polish on the operative foot and avoid getting pedicures prior to surgery. 15. Wash the night before surgery.  The night before surgery wash the foot and leg well with water and the antibacterial soap provided. Be sure to pay special attention to beneath the toenails and in between the toes.  Wash for at least three (3) minutes. Rinse thoroughly with water and dry well with a towel.  Perform this wash unless told not to do so by your physician.  Enclosed: 1 Ice pack (please put in freezer the night before surgery)   1 Hibiclens skin cleaner     Pre-op instructions  If you have any questions regarding the instructions, please do not hesitate to call our office.  [place]: [address], [place], [place] [address] -- [phone]  [place]: [address] [place], [place] [address] -- [phone]  [place]: [address]  [place], [place] [address] -- [phone]  [place]: [address], [address], [place], [place] [address] -- [phone]  Website:  [email or web address] 

## 2019-04-30 ENCOUNTER — Ambulatory Visit
Admission: RE | Admit: 2019-04-30 | Discharge: 2019-04-30 | Disposition: A | Discharge status: Home / Self Care | Payer: BC Managed Care – PPO | Source: Ambulatory Visit | Attending: Family Medicine | Admitting: Family Medicine

## 2019-04-30 DIAGNOSIS — Z1231 Encounter for screening mammogram for malignant neoplasm of breast: Secondary | ICD-10-CM | POA: Diagnosis not present

## 2019-05-01 ENCOUNTER — Telehealth: Payer: Self-pay | Admitting: *Deleted

## 2019-05-01 NOTE — Telephone Encounter (Signed)
"  I am calling to see if I was supposed to call you about my surgery.  I'm scheduled for July 14."  You will receive a call from someone from the surgical center probably on this Friday or on Monday of next week.  That person will give you your arrival time.  You need to register with the surgical center via their online portal.  The instructions to do that are in the brochure that we gave you.  It says [employer].

## 2019-05-04 ENCOUNTER — Telehealth: Payer: Self-pay | Admitting: *Deleted

## 2019-05-04 DIAGNOSIS — M79676 Pain in unspecified toe(s): Secondary | ICD-10-CM

## 2019-05-04 NOTE — Telephone Encounter (Signed)
DOS 05/08/2019; 41638 - PLANTAR FIBROMA LT FOOT  BCBS: Effective Date - 10/25/2018 - 10/24/9998    In-Network    Max Per Benefit Period Year-to-Date Remaining  CoInsurance     Deductible  $1,250.00 $819.30  Out-Of-Pocket  $3,500.00 $3,024.40   In-Network Individual  Copay Coinsurance Authorization Required  Not Applicable  45%  No  Messages: SURGERY - INSTITUTIONAL

## 2019-05-08 ENCOUNTER — Encounter: Payer: Self-pay | Admitting: Podiatry

## 2019-05-08 DIAGNOSIS — M720 Palmar fascial fibromatosis [Dupuytren]: Secondary | ICD-10-CM | POA: Diagnosis not present

## 2019-05-08 DIAGNOSIS — M722 Plantar fascial fibromatosis: Secondary | ICD-10-CM | POA: Diagnosis not present

## 2019-05-08 DIAGNOSIS — D492 Neoplasm of unspecified behavior of bone, soft tissue, and skin: Secondary | ICD-10-CM | POA: Diagnosis not present

## 2019-05-08 DIAGNOSIS — E7800 Pure hypercholesterolemia, unspecified: Secondary | ICD-10-CM | POA: Diagnosis not present

## 2019-05-14 ENCOUNTER — Encounter: Payer: Self-pay | Admitting: Podiatry

## 2019-05-18 ENCOUNTER — Telehealth: Payer: Self-pay | Admitting: Podiatry

## 2019-05-18 ENCOUNTER — Telehealth: Payer: Self-pay | Admitting: *Deleted

## 2019-05-18 ENCOUNTER — Other Ambulatory Visit: Payer: Self-pay

## 2019-05-18 ENCOUNTER — Ambulatory Visit (INDEPENDENT_AMBULATORY_CARE_PROVIDER_SITE_OTHER): Payer: BC Managed Care – PPO | Admitting: Podiatry

## 2019-05-18 VITALS — Temp 97.6°F

## 2019-05-18 DIAGNOSIS — M722 Plantar fascial fibromatosis: Secondary | ICD-10-CM

## 2019-05-18 NOTE — Telephone Encounter (Signed)
Left a voicemail message at [phone] (Cell #) regarding her wanting to get a handicap sticker.  Patient forgot to mention this to Dr.Regal when she saw him today. I left a message telling patient that she could come by on Monday to get paperwork from the front desk for a handicap sticker.

## 2019-05-18 NOTE — Telephone Encounter (Signed)
Pt called and wanted to request a handicap card. She forgot to mention it to Dr. Paulla Dolly. Please call patient

## 2019-05-23 NOTE — Progress Notes (Signed)
Subjective:   Patient ID: Becky Lara, female   DOB: 63 y.o.   MRN: [id number]   HPI Patient presents stating she is doing really well with surgery with minimal discomfort and walking with crutches currently   ROS      Objective:  Physical Exam  Neurovascular status intact negative Homans sign noted with patient's left plantar arch healing well wound edges well coapted with no significant swelling or no drainage noted     Assessment:  Doing well post plantar fibroma excision left     Plan:  H&P conveyed questions and answers to patient and reapplied sterile dressing with continued elevation compression and boot usage with gradual light weightbearing over the next several weeks and reappoint for stitch removal or earlier if any issues were to occur

## 2019-06-01 ENCOUNTER — Other Ambulatory Visit: Payer: Self-pay

## 2019-06-01 ENCOUNTER — Ambulatory Visit (INDEPENDENT_AMBULATORY_CARE_PROVIDER_SITE_OTHER): Payer: BC Managed Care – PPO | Admitting: Podiatry

## 2019-06-01 ENCOUNTER — Encounter: Payer: Self-pay | Admitting: Podiatry

## 2019-06-01 DIAGNOSIS — M722 Plantar fascial fibromatosis: Secondary | ICD-10-CM

## 2019-06-02 ENCOUNTER — Other Ambulatory Visit: Payer: Self-pay | Admitting: Family Medicine

## 2019-06-02 DIAGNOSIS — I10 Essential (primary) hypertension: Secondary | ICD-10-CM

## 2019-06-02 DIAGNOSIS — K219 Gastro-esophageal reflux disease without esophagitis: Secondary | ICD-10-CM

## 2019-06-04 NOTE — Telephone Encounter (Signed)
Patient also has not been keeping bp readings

## 2019-06-04 NOTE — Telephone Encounter (Signed)
She is only taking amlodipine per her most recent visit. Ok to ask her if she has been taking HCTZ and what her BP readings have been at home. Thanks.

## 2019-06-04 NOTE — Telephone Encounter (Signed)
Patient stated she is not taking Hydrochlorothiazide and she is not out of Amlodipine currently. She did state she will need a refill on Nexium. Is this ok?

## 2019-06-04 NOTE — Telephone Encounter (Signed)
Ok to refill nexium.

## 2019-06-04 NOTE — Telephone Encounter (Signed)
So I see that patient needs a refill on Amlodipine which we is on her medication list. I don't see Hydrochlorothiazide on the list. Are both medications appropriate to refill?

## 2019-06-05 MED ORDER — ESOMEPRAZOLE MAGNESIUM 40 MG PO CPDR: 40.0000 mg | DELAYED_RELEASE_CAPSULE | Freq: Every day | ORAL | 3 refills | Status: DC

## 2019-06-05 NOTE — Addendum Note (Signed)
Addended by: Edgar Frisk on: 06/05/2019 09:14 AM   Modules accepted: Orders

## 2019-06-06 NOTE — Progress Notes (Signed)
Subjective:   Patient ID: Becky Lara, female   DOB: 63 y.o.   MRN: [id number]   HPI Patient states doing very well with procedure with minimal discomfort and here for stitch removal   ROS      Objective:  Physical Exam  Neurovascular status intact with patient's left foot healing well wound edges well coapted stitches intact no drainage noted minimal edema     Assessment:  Doing well post foot surgery left     Plan:  Reviewed the continuation of conservative care and stitches removed with wound edges coapted well.  Applied sterile dressing continue compression elevation boot usage with gradual increase in other types of shoe gear over the next 2 to 4 weeks signed this

## 2019-06-29 ENCOUNTER — Other Ambulatory Visit: Payer: BC Managed Care – PPO

## 2019-07-04 ENCOUNTER — Ambulatory Visit (INDEPENDENT_AMBULATORY_CARE_PROVIDER_SITE_OTHER): Payer: BC Managed Care – PPO | Admitting: Podiatry

## 2019-07-04 ENCOUNTER — Ambulatory Visit (INDEPENDENT_AMBULATORY_CARE_PROVIDER_SITE_OTHER): Payer: BC Managed Care – PPO

## 2019-07-04 ENCOUNTER — Other Ambulatory Visit: Payer: Self-pay

## 2019-07-04 ENCOUNTER — Encounter: Payer: Self-pay | Admitting: Podiatry

## 2019-07-04 DIAGNOSIS — M7662 Achilles tendinitis, left leg: Secondary | ICD-10-CM | POA: Diagnosis not present

## 2019-07-04 DIAGNOSIS — M722 Plantar fascial fibromatosis: Secondary | ICD-10-CM

## 2019-07-04 MED ORDER — DICLOFENAC SODIUM 75 MG PO TBEC: 75.0000 mg | DELAYED_RELEASE_TABLET | Freq: Two times a day (BID) | ORAL | 2 refills | Status: DC

## 2019-07-04 NOTE — Progress Notes (Signed)
Subjective:   Patient ID: Becky Lara, female   DOB: 63 y.o.   MRN: [id number]   HPI Patient states that the bottom is continuing to improve and patient is wearing the beginnings of shoe gear now currently patient does have discomfort in the back of the left heel and states it hurts to wear shoes and is been present for around 5 days   ROS      Objective:  Physical Exam  Neurovascular status intact negative Homans sign noted with wound edges plantar left healing well with slight thickness of the incision secondary to the healing process but overall improved.  Posterior discomfort in the Achilles tendon is noted left with discomfort at the insertion to the calcaneus     Assessment:  Doing well post plantar fibroma excision left with probable compensatory gait pattern leading to Achilles tendinitis left     Plan:  H&P x-ray reviewed and advised on ice therapy placed on oral anti-inflammatory diclofenac 75 mg twice daily instructed on physical therapy stretching and reappoint to recheck  X-ray indicates a fairly large posterior spur formation left with no indication of stress fracture signed visit

## 2019-07-04 NOTE — Patient Instructions (Signed)

## 2019-07-08 NOTE — Patient Instructions (Addendum)
Use the drops in your left ear for the dryness and inflammation. You can use this in your right ear as well if needed. Continue on your current medications.  Take the allopurinol 100 mg tablets.    We will call you with your results.    Preventive Care 22-64 Years Old, Female Preventive care refers to visits with your health care provider and lifestyle choices that can promote health and wellness. This includes:  A yearly physical exam. This may also be called an annual well check.  Regular dental visits and eye exams.  Immunizations.  Screening for certain conditions.  Healthy lifestyle choices, such as eating a healthy diet, getting regular exercise, not using drugs or products that contain nicotine and tobacco, and limiting alcohol use. What can I expect for my preventive care visit? Physical exam Your health care provider will check your:  Height and weight. This may be used to calculate body mass index (BMI), which tells if you are at a healthy weight.  Heart rate and blood pressure.  Skin for abnormal spots. Counseling Your health care provider may ask you questions about your:  Alcohol, tobacco, and drug use.  Emotional well-being.  Home and relationship well-being.  Sexual activity.  Eating habits.  Work and work Statistician.  Method of birth control.  Menstrual cycle.  Pregnancy history. What immunizations do I need?  Influenza (flu) vaccine  This is recommended every year. Tetanus, diphtheria, and pertussis (Tdap) vaccine  You may need a Td booster every 10 years. Varicella (chickenpox) vaccine  You may need this if you have not been vaccinated. Zoster (shingles) vaccine  You may need this after age 78. Measles, mumps, and rubella (MMR) vaccine  You may need at least one dose of MMR if you were born in 1957 or later. You may also need a second dose. Pneumococcal conjugate (PCV13) vaccine  You may need this if you have certain conditions and  were not previously vaccinated. Pneumococcal polysaccharide (PPSV23) vaccine  You may need one or two doses if you smoke cigarettes or if you have certain conditions. Meningococcal conjugate (MenACWY) vaccine  You may need this if you have certain conditions. Hepatitis A vaccine  You may need this if you have certain conditions or if you travel or work in places where you may be exposed to hepatitis A. Hepatitis B vaccine  You may need this if you have certain conditions or if you travel or work in places where you may be exposed to hepatitis B. Haemophilus influenzae type b (Hib) vaccine  You may need this if you have certain conditions. Human papillomavirus (HPV) vaccine  If recommended by your health care provider, you may need three doses over 6 months. You may receive vaccines as individual doses or as more than one vaccine together in one shot (combination vaccines). Talk with your health care provider about the risks and benefits of combination vaccines. What tests do I need? Blood tests  Lipid and cholesterol levels. These may be checked every 5 years, or more frequently if you are over 82 years old.  Hepatitis C test.  Hepatitis B test. Screening  Lung cancer screening. You may have this screening every year starting at age 2 if you have a 30-pack-year history of smoking and currently smoke or have quit within the past 15 years.  Colorectal cancer screening. All adults should have this screening starting at age 37 and continuing until age 73. Your health care provider may recommend screening at age  45 if you are at increased risk. You will have tests every 1-10 years, depending on your results and the type of screening test.  Diabetes screening. This is done by checking your blood sugar (glucose) after you have not eaten for a while (fasting). You may have this done every 1-3 years.  Mammogram. This may be done every 1-2 years. Talk with your health care provider about  when you should start having regular mammograms. This may depend on whether you have a family history of breast cancer.  BRCA-related cancer screening. This may be done if you have a family history of breast, ovarian, tubal, or peritoneal cancers.  Pelvic exam and Pap test. This may be done every 3 years starting at age 81. Starting at age 24, this may be done every 5 years if you have a Pap test in combination with an HPV test. Other tests  Sexually transmitted disease (STD) testing.  Bone density scan. This is done to screen for osteoporosis. You may have this scan if you are at high risk for osteoporosis. Follow these instructions at home: Eating and drinking  Eat a diet that includes fresh fruits and vegetables, whole grains, lean protein, and low-fat dairy.  Take vitamin and mineral supplements as recommended by your health care provider.  Do not drink alcohol if: ? Your health care provider tells you not to drink. ? You are pregnant, may be pregnant, or are planning to become pregnant.  If you drink alcohol: ? Limit how much you have to 0-1 drink a day. ? Be aware of how much alcohol is in your drink. In the [place] one drink equals one 12 oz bottle of beer (355 mL), one 5 oz glass of wine (148 mL), or one 1 oz glass of hard liquor (44 mL). Lifestyle  Take daily care of your teeth and gums.  Stay active. Exercise for at least 30 minutes on 5 or more days each week.  Do not use any products that contain nicotine or tobacco, such as cigarettes, e-cigarettes, and chewing tobacco. If you need help quitting, ask your health care provider.  If you are sexually active, practice safe sex. Use a condom or other form of birth control (contraception) in order to prevent pregnancy and STIs (sexually transmitted infections).  If told by your health care provider, take low-dose aspirin daily starting at age 58. What's next?  Visit your health care provider once a year for a well check  visit.  Ask your health care provider how often you should have your eyes and teeth checked.  Stay up to date on all vaccines. This information is not intended to replace advice given to you by your health care provider. Make sure you discuss any questions you have with your health care provider. Document Released: 11/07/2015 Document Revised: 06/22/2018 Document Reviewed: 06/22/2018 Elsevier Patient Education  2020 [employer].

## 2019-07-08 NOTE — Progress Notes (Signed)
Subjective:    Patient ID: Becky Lara, female    DOB: 1956/09/25, 63 y.o.   MRN: [id number]  HPI  Chief Complaint  Patient presents with  . fasting cp    fasting cpe with pap, flu shot given today   She is here for a complete physical exam and to follow up on chronic health conditions.  Currently on medical leave for her foot. Left foot surgery in July 2020.   Other providers: Dr. Allyson Sabal- Dermatologist  Dr. Henrene Pastor - GI Dr. Jimmye Norman- Dentist   Chronic gout -states in July she had a gout flare up. Started back on allopurinol after her uric acid level was 6.9 in May 2020. Needs refill on allopurinol 100 mg.   Started on atorvastatin in May 2020 due to ASCVD 10 yr risk being 13.5%. needs to have lipid panel today. She is fasting.  Denies any side effects and reports taking it daily Hypertension-reports taking medication daily without any side effects.  Does not check blood pressure at home  She is sexually active and has 2 sexual partners. Requests STD testing.   Complains of irritated and pruritic bilateral ear canals with her left one being worse.  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  Drinks alcohol occasionally. Smokes marijuana some days. No cigarettes.   Diet: fairly healthy  Excerise:  None lately   Immunizations: UTD.  Requests flu vaccine.  Health maintenance:  Mammogram: 04/30/2019 Colonoscopy: 01/2016 and due for recall in 2022  Last Gynecological Exam: 11/2015  Negative  Last Menstrual cycle: years ago.  DEXA-apparently this was done in 2013 but no result available Last Dental Exam: last year  Last Eye Exam: one year ago   Wears seatbelt always, smoke detectors in home and functioning, does not text while driving and feels safe in home environment.   Reviewed allergies, medications, past medical, surgical, family, and social history.   Review of Systems Review of Systems Constitutional: -fever, -chills, -sweats, -unexpected  weight change,-fatigue ENT: -runny nose, -ear pain, -sore throat Cardiology:  -chest pain, -palpitations, -edema Respiratory: -cough, -shortness of breath, -wheezing Gastroenterology: -abdominal pain, -nausea, -vomiting, -diarrhea, -constipation  Hematology: -bleeding or bruising problems Musculoskeletal: -arthralgias, -myalgias, -joint swelling, -back pain Ophthalmology: -vision changes Urology: -dysuria, -difficulty urinating, -hematuria, -urinary frequency, -urgency Neurology: -headache, -weakness, -tingling, -numbness       Objective:   Physical Exam BP 120/72   Pulse 84   Temp 98.2 F (36.8 C)   Ht 5\' 6"  (1.676 m)   Wt 203 lb 9.6 oz (92.4 kg)   LMP 04/26/2008   BMI 32.86 kg/m   General Appearance:    Alert, cooperative, no distress, appears stated age  Head:    Normocephalic, without obvious abnormality, atraumatic  Eyes:    PERRL, conjunctiva/corneas clear, EOM's intact, fundi    benign  Ears:    Normal TM's.  Erythema and irritation of left external ear canal  Nose:  Mask in place  Throat:  Mask in place  Neck:   Supple, no lymphadenopathy;  thyroid:  no   enlargement/tenderness/nodules; no carotid   bruit or JVD  Back:    Spine nontender, no curvature, ROM normal, no CVA     tenderness  Lungs:     Clear to auscultation bilaterally without wheezes, rales or     ronchi; respirations unlabored  Chest Wall:    No tenderness or deformity   Heart:    Regular rate and rhythm, S1 and S2  normal, no murmur, rub   or gallop  Breast Exam:   Declines.  Mammogram recent  Abdomen:     Soft, non-tender, nondistended, normoactive bowel sounds,    no masses, no hepatosplenomegaly  Genitalia:    Normal external genitalia without lesions.  BUS and vagina normal; cervix without lesions, or cervical motion tenderness. No abnormal vaginal discharge.  Uterus and adnexa not enlarged, nontender, no masses.  Pap performed.  Chaperone present     Extremities:   No clubbing, cyanosis or edema   Pulses:   2+ and symmetric all extremities  Skin:   Skin color, texture, turgor normal, no rashes or lesions  Lymph nodes:   Cervical, supraclavicular, and axillary nodes normal  Neurologic:   CNII-XII intact, normal strength, sensation and gait; reflexes 2+ and symmetric throughout          Psych:   Normal mood, affect, hygiene and grooming.          Assessment & Plan:  Routine general medical examination at a health care facility - Plan: CBC with Differential/Platelet, Comprehensive metabolic panel, T4, free, TSH, Lipid panel -She is here today for a fasting CPE.  Discussed preventive healthcare.  Due for Pap smear today.  She is up-to-date on mammogram and colonoscopy.  DEXA is overdue.  I am unable to locate results of her 2013 bone density.  She will call and schedule DEXA at the breast center.  Discussed healthy lifestyle by cutting back on marijuana use, eating a healthy diet and getting at least 150 minutes of physical activity per week.    Essential hypertension-blood pressure is in goal range.  Continue on current medications.  Discussed low-sodium diet and stopping marijuana use.  Elevated serum creatinine-we are monitoring this.  I recommend that she keep tight control of her blood pressure in order to prevent worsening renal disease.  Avoid chronic NSAID use however she is having to take NSAIDs currently due to recent foot surgery.  Idiopathic chronic gout without tophus, unspecified site - Plan: Uric acid, allopurinol (ZYLOPRIM) 100 MG tablet-follow-up pending uric acid level.  Adjust dose as appropriate  Elevated LDL cholesterol level - Plan: Lipid panel.  She reports taking statin daily without any side effects.  She has not had her cholesterol checked since starting on statin in May 2020  Needs flu shot - Plan: Flu Vaccine QUAD 36+ mos IM  Screen for STD (sexually transmitted disease) - Plan: RPR, HIV Antibody (routine testing w rflx), Hepatitis C antibody.  Recommend using  condoms since she has multiple sexual partners.  Follow-up pending results  Medication management - Plan: Uric acid, Lipid panel.  Adjust medications as appropriate  Screen for colon cancer-due for recall for colonoscopy in 2022.  We will do stool cards today.  Screening for cervical cancer - Plan: Cytology - PAP([place])  Estrogen deficiency - Plan: VITAMIN D 25 Hydroxy (Vit-D Deficiency, Fractures), DG Bone Density.  She will call and schedule bone density.  Check vitamin D level and adjust dose as needed in order to improve bone density  Dermatitis of left ear canal - Plan: acetic acid-hydrocortisone (VOSOL-HC) OTIC solution.  No sign of infection.  Will treat for dermatitis

## 2019-07-09 ENCOUNTER — Ambulatory Visit (INDEPENDENT_AMBULATORY_CARE_PROVIDER_SITE_OTHER): Payer: BC Managed Care – PPO | Admitting: Family Medicine

## 2019-07-09 ENCOUNTER — Other Ambulatory Visit: Payer: Self-pay

## 2019-07-09 ENCOUNTER — Other Ambulatory Visit (HOSPITAL_COMMUNITY)
Admission: RE | Admit: 2019-07-09 | Discharge: 2019-07-09 | Disposition: A | Discharge status: Home / Self Care | Payer: BC Managed Care – PPO | Source: Ambulatory Visit | Attending: Family Medicine | Admitting: Family Medicine

## 2019-07-09 ENCOUNTER — Encounter: Payer: Self-pay | Admitting: Family Medicine

## 2019-07-09 VITALS — BP 120/72 | HR 84 | Temp 98.2°F | Ht 66.0 in | Wt 203.6 lb

## 2019-07-09 DIAGNOSIS — E7800 Pure hypercholesterolemia, unspecified: Secondary | ICD-10-CM

## 2019-07-09 DIAGNOSIS — Z113 Encounter for screening for infections with a predominantly sexual mode of transmission: Secondary | ICD-10-CM

## 2019-07-09 DIAGNOSIS — Z1211 Encounter for screening for malignant neoplasm of colon: Secondary | ICD-10-CM

## 2019-07-09 DIAGNOSIS — Z23 Encounter for immunization: Secondary | ICD-10-CM

## 2019-07-09 DIAGNOSIS — Z79899 Other long term (current) drug therapy: Secondary | ICD-10-CM

## 2019-07-09 DIAGNOSIS — Z124 Encounter for screening for malignant neoplasm of cervix: Secondary | ICD-10-CM

## 2019-07-09 DIAGNOSIS — Z1151 Encounter for screening for human papillomavirus (HPV): Secondary | ICD-10-CM | POA: Insufficient documentation

## 2019-07-09 DIAGNOSIS — E2839 Other primary ovarian failure: Secondary | ICD-10-CM

## 2019-07-09 DIAGNOSIS — H60542 Acute eczematoid otitis externa, left ear: Secondary | ICD-10-CM

## 2019-07-09 DIAGNOSIS — I10 Essential (primary) hypertension: Secondary | ICD-10-CM | POA: Diagnosis not present

## 2019-07-09 DIAGNOSIS — Z0000 Encounter for general adult medical examination without abnormal findings: Secondary | ICD-10-CM | POA: Diagnosis not present

## 2019-07-09 DIAGNOSIS — M1A00X0 Idiopathic chronic gout, unspecified site, without tophus (tophi): Secondary | ICD-10-CM

## 2019-07-09 DIAGNOSIS — R7989 Other specified abnormal findings of blood chemistry: Secondary | ICD-10-CM

## 2019-07-09 MED ORDER — HYDROCORTISONE-ACETIC ACID 1-2 % OT SOLN: 3.0000 [drp] | Freq: Two times a day (BID) | OTIC | 0 refills | Status: DC

## 2019-07-09 MED ORDER — ALLOPURINOL 100 MG PO TABS: 100.0000 mg | ORAL_TABLET | Freq: Every day | ORAL | 4 refills | Status: DC

## 2019-07-10 ENCOUNTER — Other Ambulatory Visit: Payer: Self-pay | Admitting: Internal Medicine

## 2019-07-10 ENCOUNTER — Other Ambulatory Visit: Payer: Self-pay | Admitting: Family Medicine

## 2019-07-10 DIAGNOSIS — E559 Vitamin D deficiency, unspecified: Secondary | ICD-10-CM

## 2019-07-10 LAB — HEPATITIS C ANTIBODY: Hep C Virus Ab: <0.1 s/co ratio (ref 0.0–0.9)

## 2019-07-10 LAB — CBC WITH DIFFERENTIAL/PLATELET
Basophils Absolute: 0.1 10*3/uL (ref 0.0–0.2)
Basos: 1 %
EOS (ABSOLUTE): 0.2 10*3/uL (ref 0.0–0.4)
Eos: 3 %
Hematocrit: 42 % (ref 34.0–46.6)
Hemoglobin: 14.1 g/dL (ref 11.1–15.9)
Immature Grans (Abs): 0 10*3/uL (ref 0.0–0.1)
Immature Granulocytes: 0 %
Lymphocytes Absolute: 2.2 10*3/uL (ref 0.7–3.1)
Lymphs: 26 %
MCH: 29.8 pg (ref 26.6–33.0)
MCHC: 33.6 g/dL (ref 31.5–35.7)
MCV: 89 fL (ref 79–97)
Monocytes Absolute: 0.5 10*3/uL (ref 0.1–0.9)
Monocytes: 6 %
Neutrophils Absolute: 5.7 10*3/uL (ref 1.4–7.0)
Neutrophils: 64 %
Platelets: 167 10*3/uL (ref 150–450)
RBC: 4.73 x10E6/uL (ref 3.77–5.28)
RDW: 13.4 % (ref 11.7–15.4)
WBC: 8.8 10*3/uL (ref 3.4–10.8)

## 2019-07-10 LAB — RPR: RPR Ser Ql: NONREACTIVE

## 2019-07-10 LAB — COMPREHENSIVE METABOLIC PANEL
ALT: 20 IU/L (ref 0–32)
AST: 16 IU/L (ref 0–40)
Albumin/Globulin Ratio: 1.3 (ref 1.2–2.2)
Albumin: 4.4 g/dL (ref 3.8–4.8)
Alkaline Phosphatase: 71 IU/L (ref 39–117)
BUN/Creatinine Ratio: 14 (ref 12–28)
BUN: 16 mg/dL (ref 8–27)
Bilirubin Total: 1 mg/dL (ref 0.0–1.2)
CO2: 23 mmol/L (ref 20–29)
Calcium: 9.5 mg/dL (ref 8.7–10.3)
Chloride: 107 mmol/L — ABNORMAL HIGH (ref 96–106)
Creatinine, Ser: 1.11 mg/dL — ABNORMAL HIGH (ref 0.57–1.00)
GFR calc Af Amer: 61 mL/min/{1.73_m2} (ref >59)
GFR calc non Af Amer: 53 mL/min/{1.73_m2} — ABNORMAL LOW (ref >59)
Globulin, Total: 3.4 g/dL (ref 1.5–4.5)
Glucose: 83 mg/dL (ref 65–99)
Potassium: 3.8 mmol/L (ref 3.5–5.2)
Sodium: 140 mmol/L (ref 134–144)
Total Protein: 7.8 g/dL (ref 6.0–8.5)

## 2019-07-10 LAB — LIPID PANEL
Chol/HDL Ratio: 2 ratio (ref 0.0–4.4)
Cholesterol, Total: 183 mg/dL (ref 100–199)
HDL: 90 mg/dL (ref >39)
LDL Chol Calc (NIH): 79 mg/dL (ref 0–99)
Triglycerides: 78 mg/dL (ref 0–149)
VLDL Cholesterol Cal: 14 mg/dL (ref 5–40)

## 2019-07-10 LAB — URIC ACID: Uric Acid: 6.8 mg/dL (ref 2.5–7.1)

## 2019-07-10 LAB — VITAMIN D 25 HYDROXY (VIT D DEFICIENCY, FRACTURES): Vit D, 25-Hydroxy: 6.7 ng/mL — ABNORMAL LOW (ref 30.0–100.0)

## 2019-07-10 LAB — HIV ANTIBODY (ROUTINE TESTING W REFLEX): HIV Screen 4th Generation wRfx: NONREACTIVE

## 2019-07-10 LAB — TSH: TSH: 1.38 u[IU]/mL (ref 0.450–4.500)

## 2019-07-10 LAB — T4, FREE: Free T4: 1.3 ng/dL (ref 0.82–1.77)

## 2019-07-10 MED ORDER — VITAMIN D (ERGOCALCIFEROL) 1.25 MG (50000 UNIT) PO CAPS: 50000.0000 [IU] | ORAL_CAPSULE | ORAL | 0 refills | Status: DC

## 2019-07-11 LAB — CYTOLOGY - PAP
Adequacy: ABSENT
Chlamydia: NEGATIVE
Diagnosis: NEGATIVE
HPV: NOT DETECTED
Neisseria Gonorrhea: NEGATIVE
Trichomonas: NEGATIVE

## 2019-07-18 ENCOUNTER — Other Ambulatory Visit (INDEPENDENT_AMBULATORY_CARE_PROVIDER_SITE_OTHER): Payer: BC Managed Care – PPO

## 2019-07-18 ENCOUNTER — Other Ambulatory Visit: Payer: Self-pay

## 2019-07-18 DIAGNOSIS — Z1211 Encounter for screening for malignant neoplasm of colon: Secondary | ICD-10-CM | POA: Diagnosis not present

## 2019-07-18 LAB — HEMOCCULT GUIAC POC 1CARD (OFFICE)
Card #2 Fecal Occult Blod, POC: NEGATIVE
Card #3 Fecal Occult Blood, POC: NEGATIVE
Fecal Occult Blood, POC: NEGATIVE

## 2019-07-31 ENCOUNTER — Ambulatory Visit
Admission: RE | Admit: 2019-07-31 | Discharge: 2019-07-31 | Disposition: A | Discharge status: Home / Self Care | Payer: BC Managed Care – PPO | Source: Ambulatory Visit | Attending: Family Medicine | Admitting: Family Medicine

## 2019-07-31 ENCOUNTER — Other Ambulatory Visit: Payer: Self-pay

## 2019-07-31 DIAGNOSIS — M85852 Other specified disorders of bone density and structure, left thigh: Secondary | ICD-10-CM | POA: Diagnosis not present

## 2019-07-31 DIAGNOSIS — Z780 Asymptomatic menopausal state: Secondary | ICD-10-CM | POA: Diagnosis not present

## 2019-07-31 DIAGNOSIS — E2839 Other primary ovarian failure: Secondary | ICD-10-CM

## 2019-08-01 ENCOUNTER — Encounter: Payer: Self-pay | Admitting: Podiatry

## 2019-08-01 ENCOUNTER — Ambulatory Visit (INDEPENDENT_AMBULATORY_CARE_PROVIDER_SITE_OTHER): Payer: Self-pay | Admitting: Podiatry

## 2019-08-01 ENCOUNTER — Other Ambulatory Visit: Payer: Self-pay

## 2019-08-01 DIAGNOSIS — M722 Plantar fascial fibromatosis: Secondary | ICD-10-CM

## 2019-08-01 DIAGNOSIS — M7662 Achilles tendinitis, left leg: Secondary | ICD-10-CM

## 2019-08-01 DIAGNOSIS — M79676 Pain in unspecified toe(s): Secondary | ICD-10-CM

## 2019-08-05 NOTE — Progress Notes (Signed)
Subjective:   Patient ID: Becky Lara, female   DOB: 63 y.o.   MRN: [id number]   HPI Patient states overall doing pretty well but still having some discomfort on the plantar aspect of the left foot   ROS      Objective:  Physical Exam  Neurovascular status found to be intact muscle strength is adequate with patient's left foot showing well-healed surgical site on the plantar aspect left with no indications of reoccurrence with mild discomfort     Assessment:  Overall doing well     Plan:  Still having pain we also discussed posterior pain the patient is experiencing and I discussed Achilles tendinitis and recommended stretching exercises anti-inflammatories and patient will be seen back 3 weeks and may require other treatment for this

## 2019-08-09 DIAGNOSIS — M79676 Pain in unspecified toe(s): Secondary | ICD-10-CM

## 2019-08-10 DIAGNOSIS — R269 Unspecified abnormalities of gait and mobility: Secondary | ICD-10-CM | POA: Diagnosis not present

## 2019-08-10 DIAGNOSIS — M25672 Stiffness of left ankle, not elsewhere classified: Secondary | ICD-10-CM | POA: Diagnosis not present

## 2019-08-10 DIAGNOSIS — M79672 Pain in left foot: Secondary | ICD-10-CM | POA: Diagnosis not present

## 2019-08-10 DIAGNOSIS — M25572 Pain in left ankle and joints of left foot: Secondary | ICD-10-CM | POA: Diagnosis not present

## 2019-08-14 DIAGNOSIS — M25672 Stiffness of left ankle, not elsewhere classified: Secondary | ICD-10-CM | POA: Diagnosis not present

## 2019-08-14 DIAGNOSIS — R269 Unspecified abnormalities of gait and mobility: Secondary | ICD-10-CM | POA: Diagnosis not present

## 2019-08-14 DIAGNOSIS — M79672 Pain in left foot: Secondary | ICD-10-CM | POA: Diagnosis not present

## 2019-08-14 DIAGNOSIS — M25572 Pain in left ankle and joints of left foot: Secondary | ICD-10-CM | POA: Diagnosis not present

## 2019-08-16 DIAGNOSIS — M79676 Pain in unspecified toe(s): Secondary | ICD-10-CM

## 2019-08-17 DIAGNOSIS — M79672 Pain in left foot: Secondary | ICD-10-CM | POA: Diagnosis not present

## 2019-08-17 DIAGNOSIS — M25572 Pain in left ankle and joints of left foot: Secondary | ICD-10-CM | POA: Diagnosis not present

## 2019-08-17 DIAGNOSIS — M25672 Stiffness of left ankle, not elsewhere classified: Secondary | ICD-10-CM | POA: Diagnosis not present

## 2019-08-17 DIAGNOSIS — R269 Unspecified abnormalities of gait and mobility: Secondary | ICD-10-CM | POA: Diagnosis not present

## 2019-08-21 DIAGNOSIS — R269 Unspecified abnormalities of gait and mobility: Secondary | ICD-10-CM | POA: Diagnosis not present

## 2019-08-21 DIAGNOSIS — M25572 Pain in left ankle and joints of left foot: Secondary | ICD-10-CM | POA: Diagnosis not present

## 2019-08-21 DIAGNOSIS — M25672 Stiffness of left ankle, not elsewhere classified: Secondary | ICD-10-CM | POA: Diagnosis not present

## 2019-08-21 DIAGNOSIS — M79672 Pain in left foot: Secondary | ICD-10-CM | POA: Diagnosis not present

## 2019-08-22 ENCOUNTER — Ambulatory Visit (INDEPENDENT_AMBULATORY_CARE_PROVIDER_SITE_OTHER): Payer: Self-pay | Admitting: Podiatry

## 2019-08-22 ENCOUNTER — Encounter: Payer: Self-pay | Admitting: Podiatry

## 2019-08-22 ENCOUNTER — Other Ambulatory Visit: Payer: Self-pay

## 2019-08-22 DIAGNOSIS — M722 Plantar fascial fibromatosis: Secondary | ICD-10-CM

## 2019-08-22 DIAGNOSIS — M7662 Achilles tendinitis, left leg: Secondary | ICD-10-CM

## 2019-08-22 NOTE — Progress Notes (Signed)
Subjective:   Patient ID: Becky Lara, female   DOB: 63 y.o.   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

## 2019-09-18 ENCOUNTER — Other Ambulatory Visit: Payer: Self-pay | Admitting: Orthotics

## 2019-10-02 ENCOUNTER — Other Ambulatory Visit: Payer: BC Managed Care – PPO

## 2019-10-02 ENCOUNTER — Other Ambulatory Visit: Payer: Self-pay

## 2019-10-02 DIAGNOSIS — E559 Vitamin D deficiency, unspecified: Secondary | ICD-10-CM

## 2019-10-03 LAB — VITAMIN D 25 HYDROXY (VIT D DEFICIENCY, FRACTURES): Vit D, 25-Hydroxy: 35.8 ng/mL (ref 30.0–100.0)

## 2019-10-04 ENCOUNTER — Ambulatory Visit: Payer: BC Managed Care – PPO | Admitting: Orthotics

## 2019-10-04 ENCOUNTER — Other Ambulatory Visit: Payer: Self-pay

## 2019-10-04 DIAGNOSIS — M722 Plantar fascial fibromatosis: Secondary | ICD-10-CM

## 2019-10-04 DIAGNOSIS — M7662 Achilles tendinitis, left leg: Secondary | ICD-10-CM

## 2019-10-04 NOTE — Progress Notes (Signed)
Patient came in today to pick up custom made foot orthotics.  The goals were accomplished and the patient reported no dissatisfaction with said orthotics.  Patient was advised of breakin period and how to report any issues. 

## 2019-10-12 ENCOUNTER — Other Ambulatory Visit: Payer: Self-pay | Admitting: Family Medicine

## 2019-10-12 DIAGNOSIS — E559 Vitamin D deficiency, unspecified: Secondary | ICD-10-CM

## 2019-10-12 NOTE — Telephone Encounter (Signed)
She no longer needs the prescription strength vitamin D. I recommend taking a Women's One A Day mulit-vitamin which has 1,000 IUs of D or just a vitamin D supplement alone if that is her preference.

## 2019-10-12 NOTE — Telephone Encounter (Signed)
Is this okay to refill? Pt lab was normal in December

## 2019-10-12 NOTE — Telephone Encounter (Signed)
Left detailed message.   

## 2019-10-16 ENCOUNTER — Other Ambulatory Visit: Payer: Self-pay | Admitting: Internal Medicine

## 2019-10-16 DIAGNOSIS — I10 Essential (primary) hypertension: Secondary | ICD-10-CM

## 2019-10-16 DIAGNOSIS — M1A00X0 Idiopathic chronic gout, unspecified site, without tophus (tophi): Secondary | ICD-10-CM

## 2019-10-16 MED ORDER — ALLOPURINOL 100 MG PO TABS: 100.0000 mg | ORAL_TABLET | Freq: Every day | ORAL | 0 refills | Status: DC

## 2019-10-16 MED ORDER — AMLODIPINE BESYLATE 10 MG PO TABS: 10.0000 mg | ORAL_TABLET | Freq: Every day | ORAL | 0 refills | Status: DC

## 2019-10-16 MED ORDER — AMLODIPINE BESYLATE 10 MG PO TABS: 10.0000 mg | ORAL_TABLET | Freq: Every day | ORAL | 1 refills | Status: DC

## 2019-12-27 ENCOUNTER — Ambulatory Visit: Payer: BC Managed Care – PPO | Attending: Internal Medicine

## 2019-12-27 DIAGNOSIS — Z23 Encounter for immunization: Secondary | ICD-10-CM

## 2019-12-27 NOTE — Progress Notes (Signed)
   Covid-19 Vaccination Clinic  Name:  Becky Lara    MRN: [id number] DOB: 1956-10-13  12/27/2019  [name] was observed post Covid-19 immunization for 15 minutes without incident. She was provided with Vaccine Information Sheet and instruction to access the V-Safe system.   [name] was instructed to call 911 with any severe reactions post vaccine: Marland Kitchen Difficulty breathing  . Swelling of face and throat  . A fast heartbeat  . A bad rash all over body  . Dizziness and weakness   Immunizations Administered    Name Date Dose VIS Date Route   Pfizer COVID-19 Vaccine 12/27/2019 11:55 AM 0.3 mL 10/05/2019 Intramuscular   Manufacturer: [place]   Lot: [id number]   [place]: [id number]

## 2020-01-07 ENCOUNTER — Other Ambulatory Visit: Payer: Self-pay

## 2020-01-07 ENCOUNTER — Encounter: Payer: Self-pay | Admitting: Family Medicine

## 2020-01-07 ENCOUNTER — Ambulatory Visit (INDEPENDENT_AMBULATORY_CARE_PROVIDER_SITE_OTHER): Payer: BC Managed Care – PPO | Admitting: Family Medicine

## 2020-01-07 VITALS — BP 124/80 | HR 83 | Wt 203.4 lb

## 2020-01-07 DIAGNOSIS — M8580 Other specified disorders of bone density and structure, unspecified site: Secondary | ICD-10-CM

## 2020-01-07 DIAGNOSIS — I10 Essential (primary) hypertension: Secondary | ICD-10-CM

## 2020-01-07 DIAGNOSIS — E559 Vitamin D deficiency, unspecified: Secondary | ICD-10-CM | POA: Diagnosis not present

## 2020-01-07 DIAGNOSIS — M1A00X0 Idiopathic chronic gout, unspecified site, without tophus (tophi): Secondary | ICD-10-CM

## 2020-01-07 DIAGNOSIS — R7989 Other specified abnormal findings of blood chemistry: Secondary | ICD-10-CM | POA: Diagnosis not present

## 2020-01-07 DIAGNOSIS — E7800 Pure hypercholesterolemia, unspecified: Secondary | ICD-10-CM | POA: Diagnosis not present

## 2020-01-07 DIAGNOSIS — Z79899 Other long term (current) drug therapy: Secondary | ICD-10-CM | POA: Diagnosis not present

## 2020-01-07 MED ORDER — ALLOPURINOL 100 MG PO TABS: 100.0000 mg | ORAL_TABLET | Freq: Every day | ORAL | 1 refills | Status: DC

## 2020-01-07 MED ORDER — AMLODIPINE BESYLATE 10 MG PO TABS: 10.0000 mg | ORAL_TABLET | Freq: Every day | ORAL | 1 refills | Status: DC

## 2020-01-07 NOTE — Progress Notes (Signed)
   Subjective:    Patient ID: Becky Lara, female    DOB: Apr 11, 1956, 64 y.o.   MRN: [id number]  HPI Chief Complaint  Patient presents with  . med check    med check, go over bone density too   She is here for a 6 month medication management visit.   HTN- amlodipine 10 mg daily. Does not check BP at home.   HL- taking statin daily and no issues.   Gout- has been out of allopurinol for the past month. No gout flares since her last visit.   Elevated serum creatinine- mildly elevated.   GERD- takes Nexium once every month. Avoid foods that cause.   Denies fever, chills, dizziness, chest pain, palpitations, shortness of breath, abdominal pain, N/V/D, urinary symptoms, LE edema.    Reviewed allergies, medications, past medical, surgical, family, and social history.   Review of Systems Pertinent positives and negatives in the history of present illness.     Objective:   Physical Exam BP 124/80   Pulse 83   Wt 203 lb 6.4 oz (92.3 kg)   LMP 04/26/2008   BMI 32.83 kg/m   Alert and in no distress.  Cardiac exam shows a regular rhythm without murmurs or gallops. Lungs are clear to auscultation. Extremities without edema. Skin is warm and dry.        Assessment & Plan:  Essential hypertension - Plan: CBC with Differential/Platelet, Comprehensive metabolic panel -Blood pressure controlled.  Continue on current medication.  Encouraged low-sodium diet  Elevated serum creatinine - Plan: Comprehensive metabolic panel -Discussed good control of blood pressure and staying hydrated.  She is not taking NSAIDs regularly now.  Follow-up pending results  Elevated LDL cholesterol level -Continue on statin, she is doing well with this medication.  Previous lipid panel responded well to statin  Idiopathic chronic gout without tophus, unspecified site - Plan: Uric acid -Start back on allopurinol, she has been out of it but did not let me know.  Denies having any gout flareups.  I  will check uric acid level  Osteopenia, unspecified location -Counseling done on this diagnosis, potential long-term health risks, management and answered all questions.  She will continue getting adequate calcium in her diet, taking vitamin D and recommended weightbearing exercises  Vitamin D deficiency - Plan: VITAMIN D 25 Hydroxy (Vit-D Deficiency, Fractures) -She is taking a daily supplement and I will need to check her vitamin D level and adjust the dose as appropriate  Medication management - Plan: Uric acid, VITAMIN D 25 Hydroxy (Vit-D Deficiency, Fractures)

## 2020-01-07 NOTE — Patient Instructions (Signed)

## 2020-01-08 LAB — CBC WITH DIFFERENTIAL/PLATELET
Basophils Absolute: 0.1 10*3/uL (ref 0.0–0.2)
Basos: 1 %
EOS (ABSOLUTE): 0.3 10*3/uL (ref 0.0–0.4)
Eos: 4 %
Hematocrit: 39.8 % (ref 34.0–46.6)
Hemoglobin: 13.1 g/dL (ref 11.1–15.9)
Immature Grans (Abs): 0 10*3/uL (ref 0.0–0.1)
Immature Granulocytes: 0 %
Lymphocytes Absolute: 2.2 10*3/uL (ref 0.7–3.1)
Lymphs: 23 %
MCH: 29.2 pg (ref 26.6–33.0)
MCHC: 32.9 g/dL (ref 31.5–35.7)
MCV: 89 fL (ref 79–97)
Monocytes Absolute: 0.7 10*3/uL (ref 0.1–0.9)
Monocytes: 8 %
Neutrophils Absolute: 6.4 10*3/uL (ref 1.4–7.0)
Neutrophils: 64 %
Platelets: 184 10*3/uL (ref 150–450)
RBC: 4.49 x10E6/uL (ref 3.77–5.28)
RDW: 12.8 % (ref 11.7–15.4)
WBC: 9.8 10*3/uL (ref 3.4–10.8)

## 2020-01-08 LAB — COMPREHENSIVE METABOLIC PANEL
ALT: 11 IU/L (ref 0–32)
AST: 15 IU/L (ref 0–40)
Albumin/Globulin Ratio: 1.3 (ref 1.2–2.2)
Albumin: 4.3 g/dL (ref 3.8–4.8)
Alkaline Phosphatase: 80 IU/L (ref 39–117)
BUN/Creatinine Ratio: 14 (ref 12–28)
BUN: 15 mg/dL (ref 8–27)
Bilirubin Total: 1 mg/dL (ref 0.0–1.2)
CO2: 20 mmol/L (ref 20–29)
Calcium: 10 mg/dL (ref 8.7–10.3)
Chloride: 108 mmol/L — ABNORMAL HIGH (ref 96–106)
Creatinine, Ser: 1.06 mg/dL — ABNORMAL HIGH (ref 0.57–1.00)
GFR calc Af Amer: 64 mL/min/{1.73_m2} (ref >59)
GFR calc non Af Amer: 55 mL/min/{1.73_m2} — ABNORMAL LOW (ref >59)
Globulin, Total: 3.3 g/dL (ref 1.5–4.5)
Glucose: 86 mg/dL (ref 65–99)
Potassium: 3.6 mmol/L (ref 3.5–5.2)
Sodium: 143 mmol/L (ref 134–144)
Total Protein: 7.6 g/dL (ref 6.0–8.5)

## 2020-01-08 LAB — VITAMIN D 25 HYDROXY (VIT D DEFICIENCY, FRACTURES): Vit D, 25-Hydroxy: 32.9 ng/mL (ref 30.0–100.0)

## 2020-01-08 LAB — URIC ACID: Uric Acid: 7.1 mg/dL (ref 3.0–7.2)

## 2020-01-08 NOTE — Progress Notes (Signed)
Your labs are all fine and your kidney function is stable. Your uric acid level has bumped up a little so getting back on the allopurinol should help lower it. Continue on your current medications.

## 2020-01-23 ENCOUNTER — Ambulatory Visit: Payer: BC Managed Care – PPO | Attending: Internal Medicine

## 2020-01-23 DIAGNOSIS — Z23 Encounter for immunization: Secondary | ICD-10-CM

## 2020-01-23 NOTE — Progress Notes (Signed)
   Covid-19 Vaccination Clinic  Name:  Becky Lara    MRN: [id number] DOB: 12-08-1955  01/23/2020  Becky Lara was observed post Covid-19 immunization for 15 minutes without incident. She was provided with Vaccine Information Sheet and instruction to access the V-Safe system.   Becky Lara was instructed to call 911 with any severe reactions post vaccine: Marland Kitchen Difficulty breathing  . Swelling of face and throat  . A fast heartbeat  . A bad rash all over body  . Dizziness and weakness   Immunizations Administered    Name Date Dose VIS Date Route   Pfizer COVID-19 Vaccine 01/23/2020  8:22 AM 0.3 mL 10/05/2019 Intramuscular   Manufacturer: [place]   Lot: [id number]   [place]: [id number]

## 2020-02-04 ENCOUNTER — Telehealth: Payer: Self-pay

## 2020-02-04 NOTE — Telephone Encounter (Signed)
That is fine. Thanks 

## 2020-02-04 NOTE — Telephone Encounter (Signed)
Pt. Called I scheduled her for an acute visit on 02/06/20 for a rash that she said is popping up all over her body, she has no other covid symptoms and has been fully vaccinated so I did schedule her for an in office visit. Let me know if that's ok or do we need to change that to a virtual.

## 2020-02-06 ENCOUNTER — Ambulatory Visit: Payer: BC Managed Care – PPO | Admitting: Family Medicine

## 2020-02-11 ENCOUNTER — Encounter: Payer: Self-pay | Admitting: Family Medicine

## 2020-02-12 ENCOUNTER — Other Ambulatory Visit: Payer: Self-pay

## 2020-02-12 ENCOUNTER — Encounter (HOSPITAL_COMMUNITY): Payer: Self-pay

## 2020-02-12 ENCOUNTER — Ambulatory Visit (HOSPITAL_COMMUNITY)
Admission: EM | Admit: 2020-02-12 | Discharge: 2020-02-12 | Disposition: A | Discharge status: Home / Self Care | Payer: BC Managed Care – PPO | Attending: Physician Assistant | Admitting: Physician Assistant

## 2020-02-12 DIAGNOSIS — R21 Rash and other nonspecific skin eruption: Secondary | ICD-10-CM

## 2020-02-12 HISTORY — DX: Dermatitis, unspecified: L30.9

## 2020-02-12 MED ORDER — TRIAMCINOLONE ACETONIDE 0.1 % EX CREA: 1.0000 "application " | TOPICAL_CREAM | Freq: Two times a day (BID) | CUTANEOUS | 0 refills | Status: DC

## 2020-02-12 NOTE — Discharge Instructions (Signed)
Utilize the cream on the lesions  Schedule a follow up with your primary care for re-evaluation in 1-2 weeks  If worsening with current treatment , such that redness spreads significantly please return, also return for fever and chills

## 2020-02-12 NOTE — ED Triage Notes (Signed)
Pt c/o  Rash on arms, right leg, [id number] that is recurrent. Pt has an area that is erythematous on right ankle. Pt has a small bump that is erythematous surrounding it on right leg, and one on left arm.

## 2020-02-12 NOTE — ED Provider Notes (Signed)
[facility]    CSN: [id number] Arrival date & time: 02/12/20  1038      History   Chief Complaint Chief Complaint  Patient presents with  . Rash    HPI Becky Lara is a 64 y.o. female.   Patient reports for evaluation of rash.  She reports she has had on and off red circular rashes over the last 2 months.  She reports they will come and go over about a 4-day.  She reports they are itchy.  She denies painful rash.  Denies fever and chills.  She reports sent today due to concern of a larger rash on the right lower leg.  Again she denies any pain, fever or chills.  Primarily they are itchy.  She has nobody else in the home with itchy rash.  She reports her primary medical history is hypertension.    Denies any cough, shortness of breath or throat swelling.  Denies ever having similar rashes prior to the last few months.     Past Medical History:  Diagnosis Date  . AKI (acute kidney injury) ([place]) 12/19/2014  . Eczema   . GERD (gastroesophageal reflux disease)   . Gout   . Hyperlipidemia   . Hypertension   . Hypokalemia 12/19/2014  . 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 Tobacco abuse    pt denies smoking cigarettes currently or as history, marijuana use only    Patient Active Problem List   Diagnosis Date Noted  . Osteopenia 01/07/2020  . Vitamin D deficiency 07/10/2019  . 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 Elevated LDL cholesterol level 01/10/2017  . Elevated serum creatinine 12/14/2016  . Chronic gout 12/14/2016  . Obesity (BMI 30-39.9) 05/03/2016  . Esophageal reflux   . HTN (hypertension) 12/19/2014  . Tobacco abuse 12/19/2014  . Blurry vision, bilateral 12/19/2014    Past Surgical History:  Procedure Laterality Date  . FOOT SURGERY    . TUBAL LIGATION      OB History   No obstetric history on file.      Home Medications    Prior to Admission medications   Medication Sig Start Date End Date Taking?  Authorizing Provider  acetic acid-hydrocortisone (VOSOL-HC) OTIC solution Place 3 drops into the left ear 2 (two) times daily. 07/09/19   Henson, Vickie L, NP-C  allopurinol (ZYLOPRIM) 100 MG tablet Take 1 tablet (100 mg total) by mouth daily. 01/07/20   Henson, Vickie L, NP-C  amLODipine (NORVASC) 10 MG tablet Take 1 tablet (10 mg total) by mouth daily. 01/07/20   Henson, Vickie L, NP-C  Ascorbic Acid (VITAMIN C) 500 MG CAPS Take by mouth.    [provider]  aspirin EC 81 MG EC tablet Take 1 tablet (81 mg total) by mouth daily. 12/20/14   Barton Dubois, MD  atorvastatin (LIPITOR) 10 MG tablet Take 1 tablet (10 mg total) by mouth daily. 03/14/19   Henson, Vickie L, NP-C  Cholecalciferol (VITAMIN D3 PO) Take 2,000 Units by mouth.    [provider]  clobetasol cream (TEMOVATE) 0.05 % APPLY TO AFFECTED AREA TWICE A DAY AS NEEDED (NOT TO FACE, GROIN, OR UNDERARMS) 11/19/15   [provider]  esomeprazole (NEXIUM) 40 MG capsule Take 1 capsule (40 mg total) by mouth daily. 06/05/19   Girtha Rm, NP-C  mometasone (ELOCON) 0.1 % cream Reported on 02/24/2016 11/19/15   [provider]  multivitamin-iron-minerals-folic acid (CENTRUM) chewable tablet Chew 1 tablet  by mouth daily.    [provider]  triamcinolone cream (KENALOG) 0.1 % Apply 1 application topically 2 (two) times daily. 02/12/20   , Marguerita Beards, PA-C    Family History Family History  Problem Relation Age of Onset  . Diabetes Mother   . Heart attack Mother   . Heart disease Mother 68       MI  . Hypertension Brother   . Heart disease Father 26       MI  . Hypertension Son   . Hypertension Daughter   . Colon cancer Neg Hx     Social History Social History   Tobacco Use  . Smoking status: Former Smoker    Packs/day: 0.05    Types: Cigarettes    Quit date: 01/14/2016    Years since quitting: 4.0  . Smokeless tobacco: Never Used  . Tobacco comment: Smokes Marijuana  Substance Use  Topics  . Alcohol use: Yes    Alcohol/week: 0.0 standard drinks    Comment: occasional  . Drug use: Yes    Types: Marijuana    Comment: marijuana---does not smoke cigarettes     Allergies   Patient has no known allergies.   Review of Systems Review of Systems  Per HPI Physical Exam Triage Vital Signs ED Triage Vitals  Enc Vitals Group     BP 02/12/20 1122 (!) 161/94     Pulse Rate 02/12/20 1122 87     Resp 02/12/20 1122 16     Temp 02/12/20 1122 98.1 F (36.7 C)     Temp Source 02/12/20 1122 Oral     SpO2 02/12/20 1122 97 %     Weight 02/12/20 1123 204 lb (92.5 kg)     Height 02/12/20 1123 5' 5.5" (1.664 m)     Head Circumference --      Peak Flow --      Pain Score 02/12/20 1123 0     Pain Loc --      Pain Edu? --      Excl. in [place]? --    No data found.  Updated Vital Signs BP (!) 161/94   Pulse 87   Temp 98.1 F (36.7 C) (Oral)   Resp 16   Ht 5' 5.5" (1.664 m)   Wt 204 lb (92.5 kg)   LMP 04/26/2008   SpO2 97%   BMI 33.43 kg/m   Visual Acuity Right Eye Distance:   Left Eye Distance:   Bilateral Distance:    Right Eye Near:   Left Eye Near:    Bilateral Near:     Physical Exam Vitals and nursing note reviewed.  Constitutional:      General: She is not in acute distress.    Appearance: Normal appearance. She is not ill-appearing.  Cardiovascular:     Rate and Rhythm: Normal rate.  Pulmonary:     Effort: Pulmonary effort is normal. No respiratory distress.  Musculoskeletal:     Comments: There are 2 annular lesions on the right lower extremity as pictured below.  Smaller which is approximately 1 cm in diameter.  Larger approximately 4 cm in diameter.  No significant blanching.  They are nontender.  Subtly raised.  Borders fairly well-defined.   There is 1 small healing lesion less than 1 cm on the left forearm.  Erythema has mostly resolved.  This is not pictured  Neurological:     Mental Status: She is alert.  UC Treatments /  Results  Labs (all labs ordered are listed, but only abnormal results are displayed) Labs Reviewed - No data to display  EKG   Radiology No results found.  Procedures Procedures (including critical care time)  Medications Ordered in UC Medications - No data to display  Initial Impression / Assessment and Plan / UC Course  I have reviewed the triage vital signs and the nursing notes.  Pertinent labs & imaging results that were available during my care of the patient were reviewed by me and considered in my medical decision making (see chart for details).     #Rash Patient is a 64 year old female presenting with 23-month history of recurrent rash.  Etiology is not all that clear at this time.  Given lack of tenderness and sporadic resolution and return out infectious.  Some consideration for insect bite with local reactions given pruritus.  Does not appear to be petechial given so few lesions.  Patient is not a diabetic, so diabetic specific rashes less likely.  We will try triamcinolone cream and have patient follow-up with primary care for reevaluation.  Patient to return if acutely worsening with current treatment.  Patient also instructed to thoroughly investigate bedroom and sleeping area.  Patient verbalized understanding this plan. Final Clinical Impressions(s) / UC Diagnoses   Final diagnoses:  Rash and nonspecific skin eruption     Discharge Instructions     Utilize the cream on the lesions  Schedule a follow up with your primary care for re-evaluation in 1-2 weeks  If worsening with current treatment , such that redness spreads significantly please return, also return for fever and chills      ED Prescriptions    Medication Sig Dispense Auth. Provider   triamcinolone cream (KENALOG) 0.1 % Apply 1 application topically 2 (two) times daily. 30 g , Marguerita Beards, PA-C     PDMP not reviewed this encounter.   Purnell Shoemaker, PA-C 02/12/20 2253

## 2020-02-13 ENCOUNTER — Telehealth: Payer: Self-pay

## 2020-02-13 NOTE — Telephone Encounter (Signed)
I reviewed the note from her visit yesterday and the pictures of her rash. I would say let's let her use the steroid cream they prescribed her for a few days before coming in to see me unless she is getting worse or having any new symptoms.

## 2020-02-13 NOTE — Telephone Encounter (Signed)
Left message for to call me back 

## 2020-02-13 NOTE — Telephone Encounter (Signed)
Getting worse so pt will come in

## 2020-02-13 NOTE — Telephone Encounter (Signed)
Pt. Called stated that she is on the schedule for tomorrow morning for a virtual and she wanted to know if you wanted her to come in in person, she went to [facility] and they told her she may need to come into the office because she may need blood work done, so she wanted you to review the notes from urgent care to see if you wanted her to come in or remain a virtual.

## 2020-02-14 ENCOUNTER — Ambulatory Visit: Payer: BC Managed Care – PPO | Admitting: Family Medicine

## 2020-03-27 ENCOUNTER — Other Ambulatory Visit: Payer: Self-pay | Admitting: Family Medicine

## 2020-03-27 DIAGNOSIS — E7800 Pure hypercholesterolemia, unspecified: Secondary | ICD-10-CM

## 2020-04-29 ENCOUNTER — Ambulatory Visit
Admission: RE | Admit: 2020-04-29 | Discharge: 2020-04-29 | Disposition: A | Discharge status: Home / Self Care | Payer: BC Managed Care – PPO | Source: Ambulatory Visit | Attending: Family Medicine | Admitting: Family Medicine

## 2020-04-29 ENCOUNTER — Other Ambulatory Visit: Payer: Self-pay

## 2020-04-29 ENCOUNTER — Encounter: Payer: Self-pay | Admitting: Family Medicine

## 2020-04-29 ENCOUNTER — Ambulatory Visit (INDEPENDENT_AMBULATORY_CARE_PROVIDER_SITE_OTHER): Payer: BC Managed Care – PPO | Admitting: Family Medicine

## 2020-04-29 VITALS — BP 120/70 | HR 77 | Temp 97.7°F | Wt 200.8 lb

## 2020-04-29 DIAGNOSIS — M25561 Pain in right knee: Secondary | ICD-10-CM

## 2020-04-29 DIAGNOSIS — M1711 Unilateral primary osteoarthritis, right knee: Secondary | ICD-10-CM | POA: Diagnosis not present

## 2020-04-29 DIAGNOSIS — G8929 Other chronic pain: Secondary | ICD-10-CM

## 2020-04-29 MED ORDER — DICLOFENAC SODIUM 1 % EX GEL: 4.0000 g | Freq: Four times a day (QID) | CUTANEOUS | 0 refills | Status: DC

## 2020-04-29 NOTE — Progress Notes (Signed)
° °  Subjective:    Patient ID: Becky Lara, female    DOB: 09/14/1956, 64 y.o.   MRN: [id number]  HPI Chief Complaint  Patient presents with   right leg    right leg near knee x a month. when she lays down after work, both legs will start aching   Complains of right knee pain that is worsening and has been severe over the past month. Pain is mainly medial and worse with ambulation and standing long hours. Pain improves with rest. No recent injury.   She works 12 hour shifts and this has been causing her a great deal of trouble.   Takes 2,5000 mg of extra strength Tylenol everyday she works now.  Cannot take NSAIDs   States her legs ache. Feels like her "bones hurt" in her lower legs.   States she has not seen an orthopedist in years.   States her ankles are swollen at the end of her shift but are normal when she is off.   Denies fever, chills, dizziness, chest pain, palpitations, shortness of breath, abdominal pain, N/V/D, urinary symptoms.  No other arthralgias.   Reviewed allergies, medications, past medical, surgical, family, and social history.   Review of Systems Pertinent positives and negatives in the history of present illness.     Objective:   Physical Exam Musculoskeletal:     Right knee: Swelling present. No erythema. Normal range of motion. Tenderness present over the medial joint line.     Right lower leg: Normal.    BP 120/70    Pulse 77    Temp 97.7 F (36.5 C)    Wt 200 lb 12.8 oz (91.1 kg)    LMP 04/26/2008    BMI 32.91 kg/m       Assessment & Plan:  Chronic pain of right knee - Plan: DG Knee Complete 4 Views Right, diclofenac Sodium (VOLTAREN) 1 % GEL, Ambulatory referral to Orthopedic Surgery  She has been off from work for the past 2 days and reports her knee significantly improving however after a single shift of standing on her feet, the pain returns.  Discussed sending her for an x-ray.  Recommend supportive care such as continuing on  Tylenol, Voltaren gel and icing.  Recommend when she has her 20-minute breaks at work that she elevate her leg and ice her knee.  I will refer her to Ortho for further evaluation.

## 2020-04-29 NOTE — Progress Notes (Signed)
Her XR shows severe degenerative joint disease. This is causing her pain. I would like for her to see Dr. Junius Roads as we discussed.

## 2020-04-29 NOTE — Patient Instructions (Signed)
Go to [facility] imaging for the x-ray.  Be careful not to take too much Tylenol as we discussed.  I recommend elevating your knee on your breaks at work and icing it.  I am prescribing Voltaren gel for you to use 4 times a day while at work also.  You should receive a call from Ortho care.  Please request to see Dr. Junius Roads

## 2020-04-30 ENCOUNTER — Encounter: Payer: Self-pay | Admitting: Internal Medicine

## 2020-05-08 ENCOUNTER — Encounter: Payer: Self-pay | Admitting: Family Medicine

## 2020-05-08 ENCOUNTER — Ambulatory Visit: Payer: BC Managed Care – PPO | Admitting: Family Medicine

## 2020-05-08 ENCOUNTER — Other Ambulatory Visit: Payer: Self-pay

## 2020-05-08 DIAGNOSIS — M1711 Unilateral primary osteoarthritis, right knee: Secondary | ICD-10-CM | POA: Diagnosis not present

## 2020-05-08 NOTE — Patient Instructions (Signed)
   Glucosamine Sulfate:  1,000 mg twice daily  Turmeric:  500 mg twice daily   

## 2020-05-08 NOTE — Progress Notes (Signed)
Office Visit Note   Patient: Becky Lara           Date of Birth: 1956-10-20           MRN: [id number] Visit Date: 05/08/2020 Requested by: Girtha Rm, NP-C [place],  [place] [address] PCP: Girtha Rm, NP-C  Subjective: Chief Complaint  Patient presents with  . Right Knee - Pain    Pain started up 2 months ago - works 12-hour shifts, on her feet (with 3 20-minute breaks). Tylenol ES used to help, but it does not help anymore. Diclofenac gel does not help. Requests temp handicap placard.    HPI: She is here with right knee pain.  Symptoms started about 2 months ago, no injury.  Gradual onset of pain on the medial aspect.  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  Her job involves standing on concrete floors for 12-hour shifts.  She thinks it has taken its toll on her knee.  Sometimes it feels like it is going to give way.  It does not lock.  She has tried diclofenac topically but it does not seem to be helping.  She has a history of gout which primarily affects her foot.  She has never had an attack in her knee.  She also has vitamin D deficiency and is taking medicine for that.  She is hoping to retire in about a year.              ROS:   All other systems were reviewed and are negative.  Objective: Vital Signs: LMP 04/26/2008   Physical Exam:  General:  Alert and oriented, in no acute distress. Pulm:  Breathing unlabored. Psy:  Normal mood, congruent affect. Skin: No erythema Right knee: 1+ patellofemoral crepitus in both knees.  Trace effusion on the right with no warmth.  She has no pain with patella compression.  She has 1+ laxity with valgus stress but still has a solid endpoint.  Lachman's feels solid.  No palpable click with McMurray's.  She is very tender on the medial joint line.  Imaging: Recent x-rays were reviewed showing moderate to severe medial compartment DJD.  No sign of loose body.  Assessment & Plan: 1.  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 -Elected to inject with cortisone today for symptomatic relief.  She will start taking glucosamine and turmeric.  We will get her set up with a medial compartment unloading brace. -Handicap placard. -Future consideration would be gel injections.     Procedures: Right knee steroid injection: After sterile prep with Betadine, injected 3 cc 1% lidocaine without epinephrine and 40 mg methylprednisolone from medial midpatellar approach.    PMFS History: Patient Active Problem List   Diagnosis Date Noted  . Osteopenia 01/07/2020  . Vitamin D deficiency 07/10/2019  . 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 Elevated LDL cholesterol level 01/10/2017  . Elevated serum creatinine 12/14/2016  . Chronic gout 12/14/2016  . Obesity (BMI 30-39.9) 05/03/2016  . Esophageal reflux   . HTN (hypertension) 12/19/2014  . Tobacco abuse 12/19/2014  . Blurry vision, bilateral 12/19/2014   Past Medical History:  Diagnosis Date  . AKI (acute kidney injury) ([place]) 12/19/2014  . Eczema   . GERD (gastroesophageal reflux disease)   . Gout   . Hyperlipidemia   . Hypertension   . Hypokalemia 12/19/2014  . 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  Tobacco abuse    pt denies smoking cigarettes currently or as history, marijuana use only    Family History  Problem Relation Age of Onset  . Diabetes Mother   . Heart attack Mother   . Heart disease Mother 63       MI  . Hypertension Brother   . Heart disease Father 31       MI  . Hypertension Son   . Hypertension Daughter   . Colon cancer Neg Hx     Past Surgical History:  Procedure Laterality Date  . FOOT SURGERY    . TUBAL LIGATION     Social History   Occupational History  . Not on file  Tobacco Use  . Smoking status: Former Smoker    Packs/day: 0.05    Types: Cigarettes    Quit date: 01/14/2016    Years since quitting: 4.3  . Smokeless tobacco: Never Used  .  Tobacco comment: Smokes Marijuana  Substance and Sexual Activity  . Alcohol use: Yes    Alcohol/week: 0.0 standard drinks    Comment: occasional  . Drug use: Yes    Types: Marijuana    Comment: marijuana---does not smoke cigarettes  . Sexual activity: Yes    Partners: Male    Comment: 2 partners

## 2020-05-13 NOTE — Progress Notes (Signed)
Rx and demographics given to [facility] with [facility] - he will contact the patient.

## 2020-05-16 ENCOUNTER — Other Ambulatory Visit: Payer: Self-pay | Admitting: Family Medicine

## 2020-05-16 DIAGNOSIS — Z1231 Encounter for screening mammogram for malignant neoplasm of breast: Secondary | ICD-10-CM

## 2020-05-27 ENCOUNTER — Ambulatory Visit
Admission: RE | Admit: 2020-05-27 | Discharge: 2020-05-27 | Disposition: A | Discharge status: Home / Self Care | Payer: BC Managed Care – PPO | Source: Ambulatory Visit | Attending: Family Medicine | Admitting: Family Medicine

## 2020-05-27 ENCOUNTER — Other Ambulatory Visit: Payer: Self-pay

## 2020-05-27 DIAGNOSIS — Z1231 Encounter for screening mammogram for malignant neoplasm of breast: Secondary | ICD-10-CM | POA: Diagnosis not present

## 2020-06-07 ENCOUNTER — Other Ambulatory Visit: Payer: Self-pay | Admitting: Family Medicine

## 2020-06-25 ENCOUNTER — Other Ambulatory Visit: Payer: Self-pay | Admitting: Family Medicine

## 2020-06-25 DIAGNOSIS — E7800 Pure hypercholesterolemia, unspecified: Secondary | ICD-10-CM

## 2020-06-25 NOTE — Telephone Encounter (Signed)
Has cpe at end of sept

## 2020-07-17 ENCOUNTER — Other Ambulatory Visit: Payer: Self-pay

## 2020-07-17 ENCOUNTER — Encounter: Payer: Self-pay | Admitting: Family Medicine

## 2020-07-17 ENCOUNTER — Ambulatory Visit (INDEPENDENT_AMBULATORY_CARE_PROVIDER_SITE_OTHER): Payer: BC Managed Care – PPO | Admitting: Family Medicine

## 2020-07-17 ENCOUNTER — Ambulatory Visit
Admission: RE | Admit: 2020-07-17 | Discharge: 2020-07-17 | Disposition: A | Discharge status: Home / Self Care | Payer: BC Managed Care – PPO | Source: Ambulatory Visit | Attending: Family Medicine | Admitting: Family Medicine

## 2020-07-17 VITALS — BP 120/70 | HR 83 | Ht 65.75 in | Wt 201.2 lb

## 2020-07-17 DIAGNOSIS — R059 Cough, unspecified: Secondary | ICD-10-CM

## 2020-07-17 DIAGNOSIS — R05 Cough: Secondary | ICD-10-CM

## 2020-07-17 DIAGNOSIS — Z0000 Encounter for general adult medical examination without abnormal findings: Secondary | ICD-10-CM | POA: Diagnosis not present

## 2020-07-17 DIAGNOSIS — Z1211 Encounter for screening for malignant neoplasm of colon: Secondary | ICD-10-CM | POA: Diagnosis not present

## 2020-07-17 DIAGNOSIS — M8580 Other specified disorders of bone density and structure, unspecified site: Secondary | ICD-10-CM

## 2020-07-17 DIAGNOSIS — Z1329 Encounter for screening for other suspected endocrine disorder: Secondary | ICD-10-CM | POA: Diagnosis not present

## 2020-07-17 DIAGNOSIS — F17200 Nicotine dependence, unspecified, uncomplicated: Secondary | ICD-10-CM

## 2020-07-17 DIAGNOSIS — Z720 Tobacco use: Secondary | ICD-10-CM | POA: Diagnosis not present

## 2020-07-17 DIAGNOSIS — E669 Obesity, unspecified: Secondary | ICD-10-CM | POA: Diagnosis not present

## 2020-07-17 DIAGNOSIS — Z23 Encounter for immunization: Secondary | ICD-10-CM | POA: Diagnosis not present

## 2020-07-17 DIAGNOSIS — E559 Vitamin D deficiency, unspecified: Secondary | ICD-10-CM | POA: Diagnosis not present

## 2020-07-17 DIAGNOSIS — E7800 Pure hypercholesterolemia, unspecified: Secondary | ICD-10-CM | POA: Diagnosis not present

## 2020-07-17 DIAGNOSIS — I10 Essential (primary) hypertension: Secondary | ICD-10-CM

## 2020-07-17 DIAGNOSIS — M1A00X0 Idiopathic chronic gout, unspecified site, without tophus (tophi): Secondary | ICD-10-CM

## 2020-07-17 NOTE — Patient Instructions (Signed)
Preventive Care 38 Years and Older, Female Preventive care refers to lifestyle choices and visits with your health care provider that can promote health and wellness. This includes:  A yearly physical exam. This is also called an annual well check.  Regular dental and eye exams.  Immunizations.  Screening for certain conditions.  Healthy lifestyle choices, such as diet and exercise. What can I expect for my preventive care visit? Physical exam Your health care provider will check:  Height and weight. These may be used to calculate body mass index (BMI), which is a measurement that tells if you are at a healthy weight.  Heart rate and blood pressure.  Your skin for abnormal spots. Counseling Your health care provider may ask you questions about:  Alcohol, tobacco, and drug use.  Emotional well-being.  Home and relationship well-being.  Sexual activity.  Eating habits.  History of falls.  Memory and ability to understand (cognition).  Work and work Statistician.  Pregnancy and menstrual history. What immunizations do I need?  Influenza (flu) vaccine  This is recommended every year. Tetanus, diphtheria, and pertussis (Tdap) vaccine  You may need a Td booster every 10 years. Varicella (chickenpox) vaccine  You may need this vaccine if you have not already been vaccinated. Zoster (shingles) vaccine  You may need this after age 33. Pneumococcal conjugate (PCV13) vaccine  One dose is recommended after age 33. Pneumococcal polysaccharide (PPSV23) vaccine  One dose is recommended after age 72. Measles, mumps, and rubella (MMR) vaccine  You may need at least one dose of MMR if you were born in 1957 or later. You may also need a second dose. Meningococcal conjugate (MenACWY) vaccine  You may need this if you have certain conditions. Hepatitis A vaccine  You may need this if you have certain conditions or if you travel or work in places where you may be exposed  to hepatitis A. Hepatitis B vaccine  You may need this if you have certain conditions or if you travel or work in places where you may be exposed to hepatitis B. Haemophilus influenzae type b (Hib) vaccine  You may need this if you have certain conditions. You may receive vaccines as individual doses or as more than one vaccine together in one shot (combination vaccines). Talk with your health care provider about the risks and benefits of combination vaccines. What tests do I need? Blood tests  Lipid and cholesterol levels. These may be checked every 5 years, or more frequently depending on your overall health.  Hepatitis C test.  Hepatitis B test. Screening  Lung cancer screening. You may have this screening every year starting at age 39 if you have a 30-pack-year history of smoking and currently smoke or have quit within the past 15 years.  Colorectal cancer screening. All adults should have this screening starting at age 36 and continuing until age 15. Your health care provider may recommend screening at age 23 if you are at increased risk. You will have tests every 1-10 years, depending on your results and the type of screening test.  Diabetes screening. This is done by checking your blood sugar (glucose) after you have not eaten for a while (fasting). You may have this done every 1-3 years.  Mammogram. This may be done every 1-2 years. Talk with your health care provider about how often you should have regular mammograms.  BRCA-related cancer screening. This may be done if you have a family history of breast, ovarian, tubal, or peritoneal cancers.  Other tests  Sexually transmitted disease (STD) testing.  Bone density scan. This is done to screen for osteoporosis. You may have this done starting at age 44. Follow these instructions at home: Eating and drinking  Eat a diet that includes fresh fruits and vegetables, whole grains, lean protein, and low-fat dairy products. Limit  your intake of foods with high amounts of sugar, saturated fats, and salt.  Take vitamin and mineral supplements as recommended by your health care provider.  Do not drink alcohol if your health care provider tells you not to drink.  If you drink alcohol: ? Limit how much you have to 0-1 drink a day. ? Be aware of how much alcohol is in your drink. In the [place] one drink equals one 12 oz bottle of beer (355 mL), one 5 oz glass of wine (148 mL), or one 1 oz glass of hard liquor (44 mL). Lifestyle  Take daily care of your teeth and gums.  Stay active. Exercise for at least 30 minutes on 5 or more days each week.  Do not use any products that contain nicotine or tobacco, such as cigarettes, e-cigarettes, and chewing tobacco. If you need help quitting, ask your health care provider.  If you are sexually active, practice safe sex. Use a condom or other form of protection in order to prevent STIs (sexually transmitted infections).  Talk with your health care provider about taking a low-dose aspirin or statin. What's next?  Go to your health care provider once a year for a well check visit.  Ask your health care provider how often you should have your eyes and teeth checked.  Stay up to date on all vaccines. This information is not intended to replace advice given to you by your health care provider. Make sure you discuss any questions you have with your health care provider. Document Revised: 10/05/2018 Document Reviewed: 10/05/2018 Elsevier Patient Education  2020 [employer].

## 2020-07-17 NOTE — Progress Notes (Signed)
Subjective:    Patient ID: Becky Lara, female    DOB: Mar 15, 1956, 64 y.o.   MRN: [id number]  HPI Chief Complaint  Patient presents with   fasting cpe    fasting cpe    She is here for a complete physical exam and to follow up on chronic health conditions.  Last CPE: 07/09/2019  Other providers: Dr. Allyson Sabal- Dermatologist  Dr. Henrene Pastor - GI Dr. Jimmye Norman- Dentist   Gout- taking allopurinol and no gout flare ups.   HTN- does not check BP at home. Taking amlodipine with no concerns.   HL- taking atorvastatin daily   GERD- takes Nexium prn. Not often.   Smoker with occasional cough and burning in chest with smoking.   Social history: Lives with her friend,  Smokes marijuana. Drinks a shot of vodka most days or a glass of chardonnay.  Has been smoking since age 89  Diet: fairly healthy  Excerise: walking more  Immunizations: needs Prevnar 13 and flu  Health maintenance:  Mammogram: 05/2020 Colonoscopy: 2017 and due again in 2022. Negative stool cards in 06/2019 Last Gynecological Exam: 06/2019 DEXA-apparently this was done in 2013 but no result available  Last Dental Exam: 11/2019 Last Eye Exam: 1 year ago   Wears seatbelt always, smoke detectors in home and functioning, does not text while driving and feels safe in home environment.   Reviewed allergies, medications, past medical, surgical, family, and social history.   Review of Systems Review of Systems Constitutional: -fever, -chills, -sweats, -unexpected weight change,-fatigue ENT: -runny nose, -ear pain, -sore throat Cardiology:  -chest pain, -palpitations, -edema Respiratory: +cough, -shortness of breath, -wheezing Gastroenterology: -abdominal pain, -nausea, -vomiting, -diarrhea, -constipation  Hematology: -bleeding or bruising problems Musculoskeletal: -arthralgias, -myalgias, -joint swelling, -back pain Ophthalmology: -vision changes Urology: -dysuria, -difficulty urinating, -hematuria, -urinary  frequency, -urgency Neurology: -headache, -weakness, -tingling, -numbness       Objective:   Physical Exam BP 120/70    Pulse 83    Ht 5' 5.75" (1.67 m)    Wt 201 lb 3.2 oz (91.3 kg)    LMP 04/26/2008    BMI 32.72 kg/m   General Appearance:    Alert, cooperative, no distress, appears stated age  Head:    Normocephalic, without obvious abnormality, atraumatic  Eyes:    PERRL, conjunctiva/corneas clear, EOM's intact  Ears:    Normal TM's and external ear canals  Nose:   Mask on   Throat:   Mask on   Neck:   Supple, no lymphadenopathy;  thyroid:  no   enlargement/tenderness/nodules; no JVD  Back:    Spine nontender, no curvature, ROM normal, no CVA     tenderness  Lungs:     Clear to auscultation bilaterally without wheezes, rales or     ronchi; respirations unlabored  Chest Wall:    No tenderness or deformity   Heart:    Regular rate and rhythm, S1 and S2 normal, no murmur, rub   or gallop  Breast Exam:    Declines   Abdomen:     Soft, non-tender, nondistended, normoactive bowel sounds,    no masses, no hepatosplenomegaly  Genitalia:    Declines   Rectal:   Not done. Stool cards sent home  Extremities:   No clubbing, cyanosis or edema  Pulses:   2+ and symmetric all extremities  Skin:   Skin color, texture, turgor normal, no rashes or lesions  Lymph nodes:   Cervical, supraclavicular, and axillary nodes normal  Neurologic:  CNII-XII intact, normal strength, sensation and gait; reflexes 2+ and symmetric throughout          Psych:   Normal mood, affect, hygiene and grooming.         Assessment & Plan:  Routine general medical examination at a health care facility - Plan: CBC with Differential/Platelet, Comprehensive metabolic panel, TSH, T4, free, T3 -preventive health care reviewed and she is up to date. She is in her usual state of health and in good spirits overall. She did lose her best friend recently. Discussed allowing herself to deal with stages of grief.  Counseling on  healthy lifestyle including diet and exercise. Immunizations reviewed and updated.  Discussed safety and heath promotion.   Essential hypertension - Plan: CBC with Differential/Platelet, Comprehensive metabolic panel -BP controlled. Continue amlodipine. No side effects.   Osteopenia, unspecified location -explained this diagnosis and how to prevent from worsening.   Vitamin D deficiency - Plan: VITAMIN D 25 Hydroxy (Vit-D Deficiency, Fractures) -check vitamin D level and advise supplement as appropriate   Tobacco abuse -she uses rolled up tobacco for her marijuana. Discussed this may lead to health consequences.   Obesity (BMI 30-39.9) - Plan: TSH, T4, free, T3 -recommend healthy diet and exercise.   Idiopathic chronic gout without tophus, unspecified site -continue allopurinol. No recent flares.   Elevated LDL cholesterol level - Plan: Lipid panel -continue statin therapy  Needs flu shot - Plan: Flu Vaccine QUAD High Dose(Fluad)  Smoker - Plan: DG Chest 2 View -advised to stop. She is smoking marijuana but using tobacco products for this.   Cough - Plan: DG Chest 2 View -screen for underlying etiology for cough  Need for vaccination against Streptococcus pneumoniae - Plan: Pneumococcal conjugate vaccine 13-valent  Screen for colon cancer - Plan: POCT occult blood stool -due for colonoscopy next year. Screen with stool cards.   Screening for thyroid disorder - Plan: TSH, T4, free, T3

## 2020-07-17 NOTE — Progress Notes (Signed)
Her chest XR is normal.

## 2020-07-18 LAB — TSH: TSH: 0.742 u[IU]/mL (ref 0.450–4.500)

## 2020-07-18 LAB — COMPREHENSIVE METABOLIC PANEL
ALT: 13 IU/L (ref 0–32)
AST: 16 IU/L (ref 0–40)
Albumin/Globulin Ratio: 1.3 (ref 1.2–2.2)
Albumin: 4.3 g/dL (ref 3.8–4.8)
Alkaline Phosphatase: 75 IU/L (ref 44–121)
BUN/Creatinine Ratio: 12 (ref 12–28)
BUN: 12 mg/dL (ref 8–27)
Bilirubin Total: 0.9 mg/dL (ref 0.0–1.2)
CO2: 23 mmol/L (ref 20–29)
Calcium: 9.6 mg/dL (ref 8.7–10.3)
Chloride: 106 mmol/L (ref 96–106)
Creatinine, Ser: 0.99 mg/dL (ref 0.57–1.00)
GFR calc Af Amer: 69 mL/min/{1.73_m2} (ref >59)
GFR calc non Af Amer: 60 mL/min/{1.73_m2} (ref >59)
Globulin, Total: 3.3 g/dL (ref 1.5–4.5)
Glucose: 90 mg/dL (ref 65–99)
Potassium: 4.6 mmol/L (ref 3.5–5.2)
Sodium: 143 mmol/L (ref 134–144)
Total Protein: 7.6 g/dL (ref 6.0–8.5)

## 2020-07-18 LAB — LIPID PANEL
Chol/HDL Ratio: 1.7 ratio (ref 0.0–4.4)
Cholesterol, Total: 161 mg/dL (ref 100–199)
HDL: 95 mg/dL (ref >39)
LDL Chol Calc (NIH): 53 mg/dL (ref 0–99)
Triglycerides: 63 mg/dL (ref 0–149)
VLDL Cholesterol Cal: 13 mg/dL (ref 5–40)

## 2020-07-18 LAB — CBC WITH DIFFERENTIAL/PLATELET
Basophils Absolute: 0.1 10*3/uL (ref 0.0–0.2)
Basos: 1 %
EOS (ABSOLUTE): 0.3 10*3/uL (ref 0.0–0.4)
Eos: 5 %
Hematocrit: 41.8 % (ref 34.0–46.6)
Hemoglobin: 13.6 g/dL (ref 11.1–15.9)
Immature Grans (Abs): 0 10*3/uL (ref 0.0–0.1)
Immature Granulocytes: 0 %
Lymphocytes Absolute: 1.7 10*3/uL (ref 0.7–3.1)
Lymphs: 27 %
MCH: 29.2 pg (ref 26.6–33.0)
MCHC: 32.5 g/dL (ref 31.5–35.7)
MCV: 90 fL (ref 79–97)
Monocytes Absolute: 0.5 10*3/uL (ref 0.1–0.9)
Monocytes: 8 %
Neutrophils Absolute: 3.7 10*3/uL (ref 1.4–7.0)
Neutrophils: 59 %
Platelets: 165 10*3/uL (ref 150–450)
RBC: 4.65 x10E6/uL (ref 3.77–5.28)
RDW: 12.7 % (ref 11.7–15.4)
WBC: 6.2 10*3/uL (ref 3.4–10.8)

## 2020-07-18 LAB — T4, FREE: Free T4: 1.34 ng/dL (ref 0.82–1.77)

## 2020-07-18 LAB — VITAMIN D 25 HYDROXY (VIT D DEFICIENCY, FRACTURES): Vit D, 25-Hydroxy: 33.3 ng/mL (ref 30.0–100.0)

## 2020-07-18 LAB — T3: T3, Total: 112 ng/dL (ref 71–180)

## 2020-07-18 NOTE — Progress Notes (Signed)
Her labs look great.

## 2020-07-21 ENCOUNTER — Encounter: Payer: Self-pay | Admitting: Internal Medicine

## 2020-08-20 ENCOUNTER — Other Ambulatory Visit: Payer: Self-pay

## 2020-08-20 ENCOUNTER — Ambulatory Visit (INDEPENDENT_AMBULATORY_CARE_PROVIDER_SITE_OTHER): Payer: BC Managed Care – PPO

## 2020-08-20 DIAGNOSIS — Z23 Encounter for immunization: Secondary | ICD-10-CM

## 2020-09-04 ENCOUNTER — Other Ambulatory Visit: Payer: Self-pay | Admitting: Family Medicine

## 2020-09-23 ENCOUNTER — Other Ambulatory Visit: Payer: Self-pay | Admitting: Family Medicine

## 2020-09-23 DIAGNOSIS — E7800 Pure hypercholesterolemia, unspecified: Secondary | ICD-10-CM

## 2020-12-09 IMAGING — CR DG KNEE COMPLETE 4+V*R*
5 series · 5 of 5 positions shown · non-contrast
Comparison: None.

CLINICAL DATA: Chronic right knee pain without known injury.

EXAM:
RIGHT KNEE - COMPLETE 4+ VIEW

[t knee ap right]
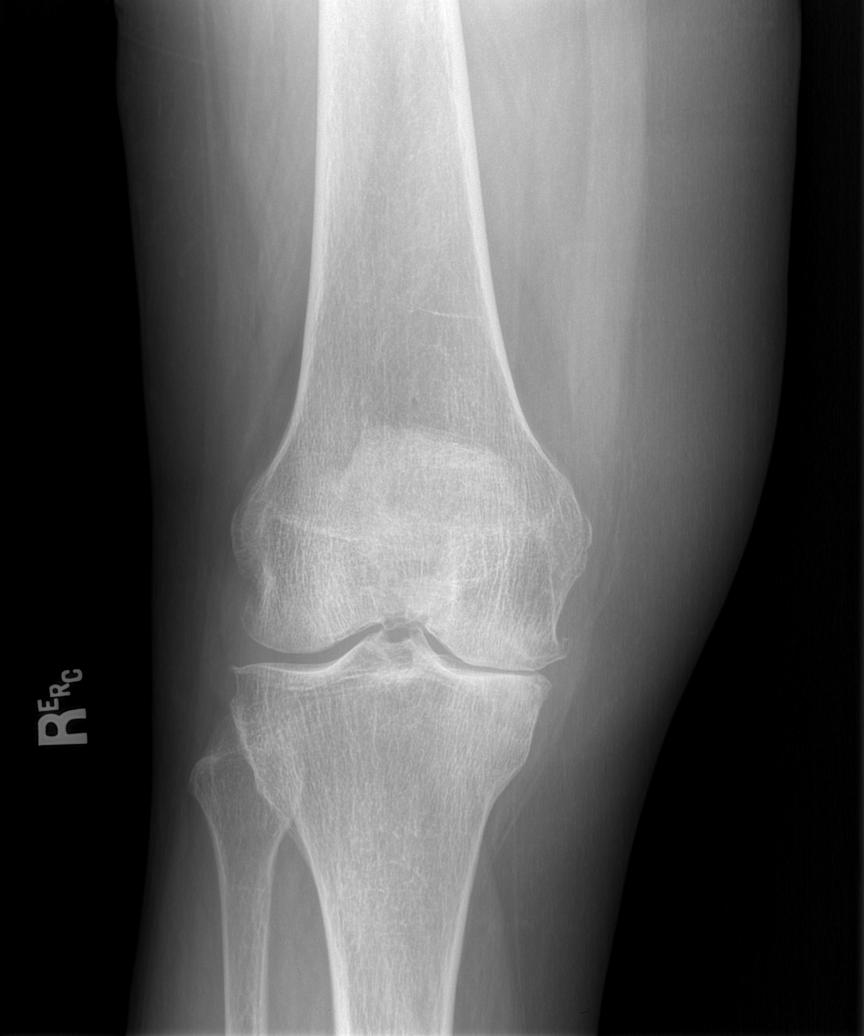

[t knee oblique right (1 of 2)]
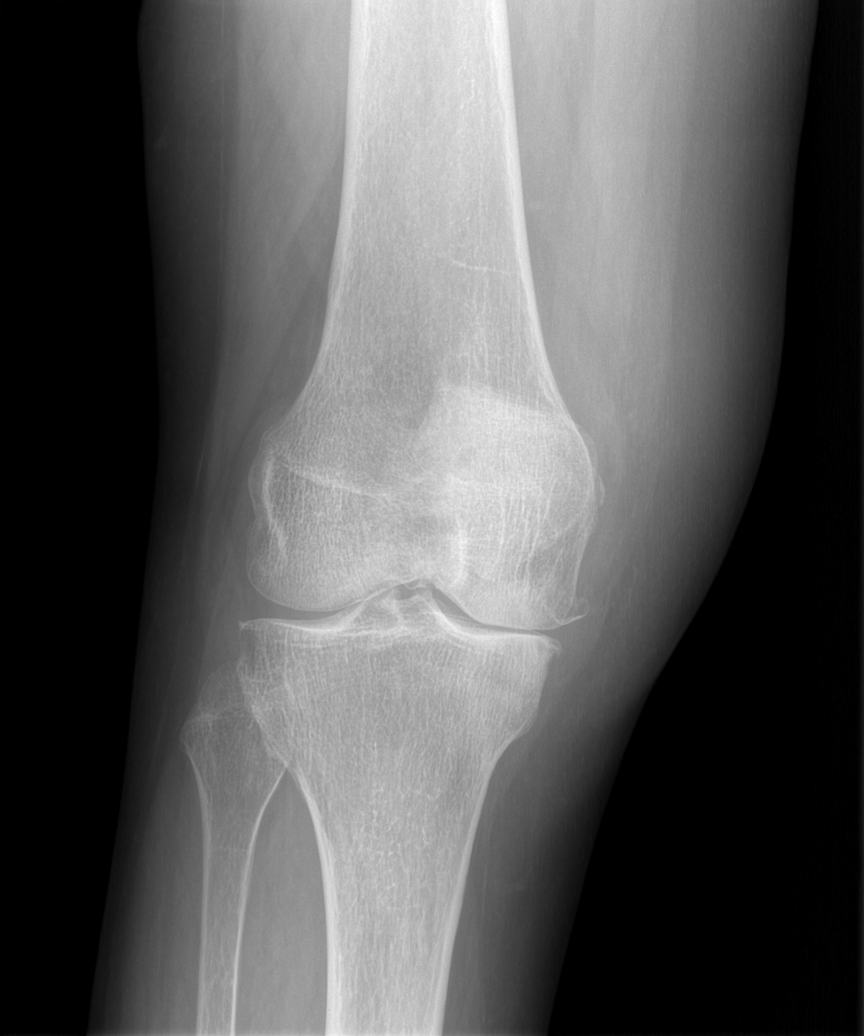

[t knee oblique right (2 of 2)]
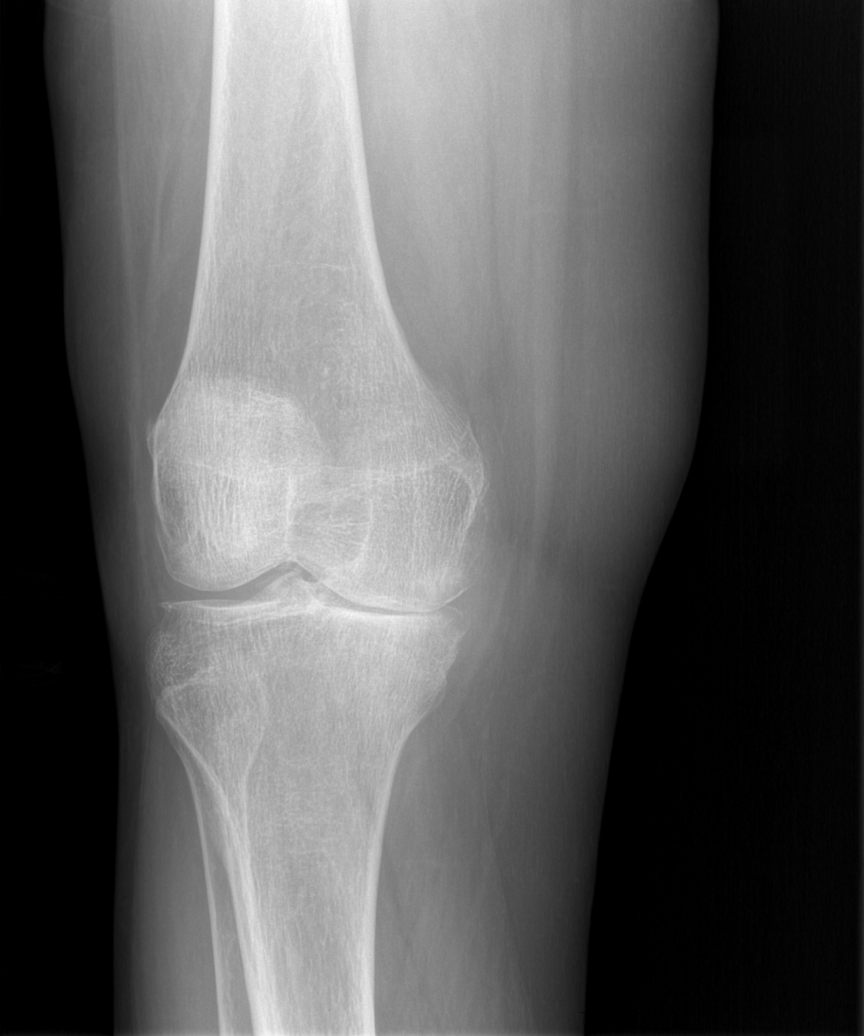

[t knee lat right (1 of 2)]
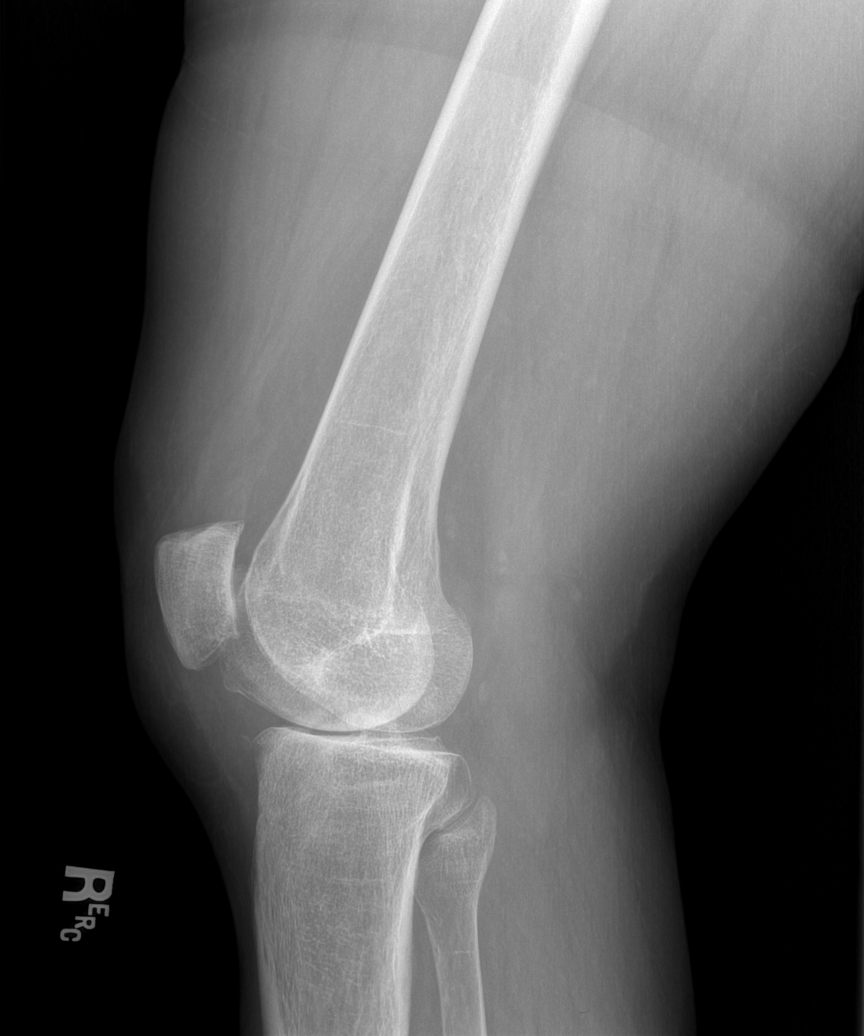

[t knee lat right (2 of 2)]
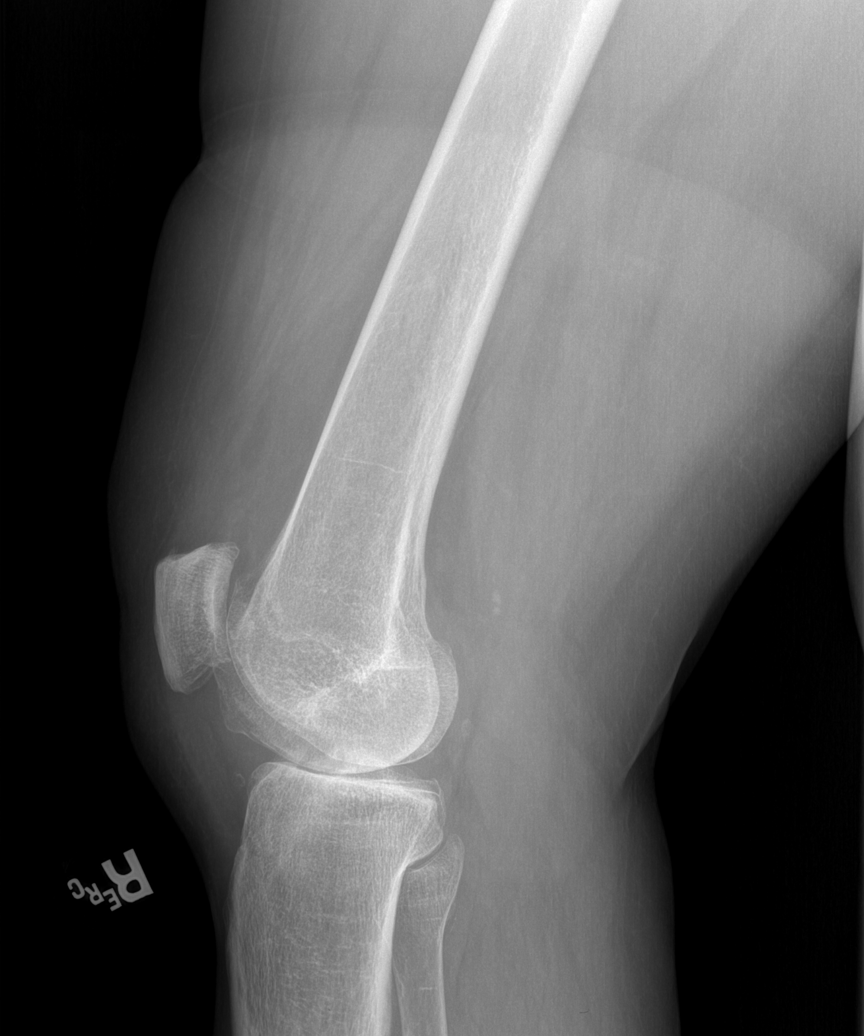

[5 of 5 positions shown; findings below may reference images not displayed]

FINDINGS: No evidence of fracture, dislocation, or joint effusion. Severe
narrowing of medial joint space is noted. Soft tissues are
unremarkable.
IMPRESSION: Severe degenerative joint disease. No acute abnormality seen in the
right knee.

## 2021-02-12 NOTE — Progress Notes (Signed)
Subjective:    Patient ID: Becky Lara, female    DOB: 1956/07/24, 65 y.o.   MRN: [id number]  HPI Chief Complaint  Patient presents with  . med check    Med check .needs colonoscopy    She is here for medication management visit.  States she thinks she is due for her colonoscopy.    2 month history of edema to her lower legs. This is new. States her diet is high in salt. She stands in one place mostly while at work and then is not active at home. She has been wearing compression socks.  States she has "bone pain" in her legs after her work shifts but not during work and not on her days off.  Denies numbness, tingling or weakness.  Right knee pain but no other arthralgias.   HTN- reports good compliance with amlodipine 10 mg and has been on this for years.  HL- taking statin daily and no concerns   Gout- no flare ups. Has been out of allopurinol for the past 2 months.   Osteopenia-last DEXA 07/31/2019.   Vitamin D def- taking 2,000 IUs daily   Denies fever, chills, dizziness, chest pain, palpitations, shortness of breath, cough, orthopnea, abdominal pain, N/V/D, urinary symptoms.   Reviewed allergies, medications, past medical, surgical, family, and social history.    Review of Systems Pertinent positives and negatives in the history of present illness.     Objective:   Physical Exam BP 130/70   Pulse 81   Wt 200 lb 6.4 oz (90.9 kg)   LMP 04/26/2008   BMI 32.59 kg/m   Alert and in no distress.  Neck is supple, no JVD.  Cardiac exam shows a regular sinus rhythm without murmurs or gallops. Lungs are clear to auscultation. Extremities with bilateral 1+ pitting edema to mid shin. Bilateral LE are neurovascularly intact. Skin is warm and dry. Normal speech and mood.       Assessment & Plan:  Primary hypertension - Plan: CBC with Differential/Platelet, Comprehensive metabolic panel -Blood pressure controlled.  Continue amlodipine.  Consider this as a source of her  LE edema. Recommend cutting back on sodium  Elevated LDL cholesterol level - Plan: Lipid panel -Continue statin therapy.  Follow-up pending lipid panel results  Dependent edema - Plan: CBC with Differential/Platelet, Comprehensive metabolic panel -She does a lot of standing in place with her job and outside of work is not very active.  Encouraged increasing physical activity and cutting back on sodium.  Compression stocking and elevating legs when she is sitting.  Check labs and follow-up  Idiopathic chronic gout without tophus, unspecified site - Plan: allopurinol (ZYLOPRIM) 100 MG tablet -She has been out of allopurinol for 2 months.  She will start back on the medication.  No recent gout flares.  Osteopenia, unspecified location -She will be due for her next DEXA in October 2022.  Continue getting adequate calcium in her diet, taking a vitamin D supplement and increase weightbearing exercises  Smoker -She smokes marijuana but not tobacco and I did recommend that she stop  Vitamin D deficiency - Plan: VITAMIN D 25 Hydroxy (Vit-D Deficiency, Fractures) -Check vitamin D level and adjust dose as appropriate  Screen for colon cancer - Plan: Ambulatory referral to Gastroenterology -Referral back to GI for her recommended 5 to 10-year recall per patient request.  Gastroesophageal reflux disease, unspecified whether esophagitis present - Plan: esomeprazole (NEXIUM) 40 MG capsule -Reflux is not bothersome and she takes Nexium only  as needed.  Continue to avoid triggers  Heavy alcohol use -Discussed potential health consequences associated with healthy alcohol use and encouraged her to cut back

## 2021-02-13 ENCOUNTER — Other Ambulatory Visit: Payer: Self-pay

## 2021-02-13 ENCOUNTER — Ambulatory Visit (INDEPENDENT_AMBULATORY_CARE_PROVIDER_SITE_OTHER): Payer: BC Managed Care – PPO | Admitting: Family Medicine

## 2021-02-13 VITALS — BP 130/70 | HR 81 | Wt 200.4 lb

## 2021-02-13 DIAGNOSIS — Z1211 Encounter for screening for malignant neoplasm of colon: Secondary | ICD-10-CM

## 2021-02-13 DIAGNOSIS — M1A00X0 Idiopathic chronic gout, unspecified site, without tophus (tophi): Secondary | ICD-10-CM

## 2021-02-13 DIAGNOSIS — R609 Edema, unspecified: Secondary | ICD-10-CM | POA: Insufficient documentation

## 2021-02-13 DIAGNOSIS — E559 Vitamin D deficiency, unspecified: Secondary | ICD-10-CM

## 2021-02-13 DIAGNOSIS — K219 Gastro-esophageal reflux disease without esophagitis: Secondary | ICD-10-CM

## 2021-02-13 DIAGNOSIS — M8580 Other specified disorders of bone density and structure, unspecified site: Secondary | ICD-10-CM

## 2021-02-13 DIAGNOSIS — E7800 Pure hypercholesterolemia, unspecified: Secondary | ICD-10-CM

## 2021-02-13 DIAGNOSIS — I10 Essential (primary) hypertension: Secondary | ICD-10-CM | POA: Diagnosis not present

## 2021-02-13 DIAGNOSIS — Z789 Other specified health status: Secondary | ICD-10-CM

## 2021-02-13 DIAGNOSIS — F17200 Nicotine dependence, unspecified, uncomplicated: Secondary | ICD-10-CM

## 2021-02-13 DIAGNOSIS — F1090 Alcohol use, unspecified, uncomplicated: Secondary | ICD-10-CM | POA: Insufficient documentation

## 2021-02-13 MED ORDER — ESOMEPRAZOLE MAGNESIUM 40 MG PO CPDR: 40.0000 mg | DELAYED_RELEASE_CAPSULE | Freq: Every day | ORAL | 2 refills | Status: DC

## 2021-02-13 MED ORDER — ALLOPURINOL 100 MG PO TABS: 100.0000 mg | ORAL_TABLET | Freq: Every day | ORAL | 1 refills | Status: DC

## 2021-02-13 NOTE — Patient Instructions (Signed)
Cut back on alcohol and salt.   Elevate your legs when you are sitting.  Wear compression socks during the day.   Continue your current medications.   Follow up in 3 months or sooner if needed.     Edema  Edema is when you have too much fluid in your body or under your skin. Edema may make your legs, feet, and ankles swell up. Swelling is also common in looser tissues, like around your eyes. This is a common condition. It gets more common as you get older. There are many possible causes of edema. Eating too much salt (sodium) and being on your feet or sitting for a long time can cause edema in your legs, feet, and ankles. Hot weather may make edema worse. Edema is usually painless. Your skin may look swollen or shiny. Follow these instructions at home:  Keep the swollen body part raised (elevated) above the level of your heart when you are sitting or lying down.  Do not sit still or stand for a long time.  Do not wear tight clothes. Do not wear garters on your upper legs.  Exercise your legs. This can help the swelling go down.  Wear elastic bandages or support stockings as told by your doctor.  Eat a low-salt (low-sodium) diet to reduce fluid as told by your doctor.  Depending on the cause of your swelling, you may need to limit how much fluid you drink (fluid restriction).  Take over-the-counter and prescription medicines only as told by your doctor. Contact a doctor if:  Treatment is not working.  You have heart, liver, or kidney disease and have symptoms of edema.  You have sudden and unexplained weight gain. Get help right away if:  You have shortness of breath or chest pain.  You cannot breathe when you lie down.  You have pain, redness, or warmth in the swollen areas.  You have heart, liver, or kidney disease and get edema all of a sudden.  You have a fever and your symptoms get worse all of a sudden. Summary  Edema is when you have too much fluid in your  body or under your skin.  Edema may make your legs, feet, and ankles swell up. Swelling is also common in looser tissues, like around your eyes.  Raise (elevate) the swollen body part above the level of your heart when you are sitting or lying down.  Follow your doctor's instructions about diet and how much fluid you can drink (fluid restriction). This information is not intended to replace advice given to you by your health care provider. Make sure you discuss any questions you have with your health care provider. Document Revised: 08/06/2020 Document Reviewed: 08/06/2020 Elsevier Patient Education  2021 [employer].

## 2021-02-14 LAB — CBC WITH DIFFERENTIAL/PLATELET
Basophils Absolute: 0.1 10*3/uL (ref 0.0–0.2)
Basos: 1 %
EOS (ABSOLUTE): 0.5 10*3/uL — ABNORMAL HIGH (ref 0.0–0.4)
Eos: 6 %
Hematocrit: 39 % (ref 34.0–46.6)
Hemoglobin: 12.9 g/dL (ref 11.1–15.9)
Immature Grans (Abs): 0 10*3/uL (ref 0.0–0.1)
Immature Granulocytes: 0 %
Lymphocytes Absolute: 1.6 10*3/uL (ref 0.7–3.1)
Lymphs: 21 %
MCH: 29.2 pg (ref 26.6–33.0)
MCHC: 33.1 g/dL (ref 31.5–35.7)
MCV: 88 fL (ref 79–97)
Monocytes Absolute: 0.5 10*3/uL (ref 0.1–0.9)
Monocytes: 6 %
Neutrophils Absolute: 5.1 10*3/uL (ref 1.4–7.0)
Neutrophils: 66 %
Platelets: 161 10*3/uL (ref 150–450)
RBC: 4.42 x10E6/uL (ref 3.77–5.28)
RDW: 12.6 % (ref 11.7–15.4)
WBC: 7.7 10*3/uL (ref 3.4–10.8)

## 2021-02-14 LAB — COMPREHENSIVE METABOLIC PANEL
ALT: 13 IU/L (ref 0–32)
AST: 15 IU/L (ref 0–40)
Albumin/Globulin Ratio: 1.4 (ref 1.2–2.2)
Albumin: 4.3 g/dL (ref 3.8–4.8)
Alkaline Phosphatase: 68 IU/L (ref 44–121)
BUN/Creatinine Ratio: 18 (ref 12–28)
BUN: 16 mg/dL (ref 8–27)
Bilirubin Total: 0.6 mg/dL (ref 0.0–1.2)
CO2: 20 mmol/L (ref 20–29)
Calcium: 9.1 mg/dL (ref 8.7–10.3)
Chloride: 107 mmol/L — ABNORMAL HIGH (ref 96–106)
Creatinine, Ser: 0.88 mg/dL (ref 0.57–1.00)
Globulin, Total: 3 g/dL (ref 1.5–4.5)
Glucose: 85 mg/dL (ref 65–99)
Potassium: 4.3 mmol/L (ref 3.5–5.2)
Sodium: 143 mmol/L (ref 134–144)
Total Protein: 7.3 g/dL (ref 6.0–8.5)
eGFR: 72 mL/min/{1.73_m2} (ref >59)

## 2021-02-14 LAB — LIPID PANEL
Chol/HDL Ratio: 1.9 ratio (ref 0.0–4.4)
Cholesterol, Total: 154 mg/dL (ref 100–199)
HDL: 79 mg/dL (ref >39)
LDL Chol Calc (NIH): 64 mg/dL (ref 0–99)
Triglycerides: 50 mg/dL (ref 0–149)
VLDL Cholesterol Cal: 11 mg/dL (ref 5–40)

## 2021-02-14 LAB — VITAMIN D 25 HYDROXY (VIT D DEFICIENCY, FRACTURES): Vit D, 25-Hydroxy: 32.3 ng/mL (ref 30.0–100.0)

## 2021-02-15 NOTE — Progress Notes (Signed)
Her labs look good. Continue current medications.

## 2021-02-17 ENCOUNTER — Encounter: Payer: Self-pay | Admitting: Internal Medicine

## 2021-04-16 ENCOUNTER — Other Ambulatory Visit: Payer: Self-pay | Admitting: Family Medicine

## 2021-04-16 DIAGNOSIS — Z1231 Encounter for screening mammogram for malignant neoplasm of breast: Secondary | ICD-10-CM

## 2021-05-07 ENCOUNTER — Ambulatory Visit (AMBULATORY_SURGERY_CENTER): Payer: Self-pay | Admitting: *Deleted

## 2021-05-07 ENCOUNTER — Other Ambulatory Visit: Payer: Self-pay

## 2021-05-07 VITALS — Ht 65.75 in | Wt 201.0 lb

## 2021-05-07 DIAGNOSIS — Z86010 Personal history of colonic polyps: Secondary | ICD-10-CM

## 2021-05-07 MED ORDER — PLENVU 140 G PO SOLR: 1.0000 | ORAL | 0 refills | Status: DC

## 2021-05-07 NOTE — Progress Notes (Signed)
No egg or soy allergy known to patient  No issues with past sedation with any surgeries or procedures Patient denies ever being told they had issues or difficulty with intubation  No FH of Malignant Hyperthermia No diet pills per patient No home 02 use per patient  No blood thinners per patient  Pt denies issues with constipation  No A fib or A flutter  EMMI video to pt or via MyChart  COVID 19 guidelines implemented in PV today with Pt and RN  Pt is fully vaccinated  for Covid   Plenvu Coupon given to pt in PV today , Code to Pharmacy and  NO PA's for preps discussed with pt In PV today  Discussed with pt there will be an out-of-pocket cost for prep and that varies from $0 to 70 dollars   Due to the COVID-19 pandemic we are asking patients to follow certain guidelines.  Pt aware of COVID protocols and [facility] guidelines   

## 2021-05-12 ENCOUNTER — Ambulatory Visit: Payer: BC Managed Care – PPO | Admitting: Family Medicine

## 2021-05-21 ENCOUNTER — Other Ambulatory Visit: Payer: Self-pay

## 2021-05-21 ENCOUNTER — Encounter: Payer: Self-pay | Admitting: Internal Medicine

## 2021-05-21 ENCOUNTER — Ambulatory Visit (AMBULATORY_SURGERY_CENTER): Payer: BC Managed Care – PPO | Admitting: Internal Medicine

## 2021-05-21 VITALS — BP 121/68 | HR 81 | Temp 95.7°F | Resp 11 | Ht 65.0 in | Wt 201.0 lb

## 2021-05-21 DIAGNOSIS — Z86010 Personal history of colonic polyps: Secondary | ICD-10-CM

## 2021-05-21 DIAGNOSIS — K635 Polyp of colon: Secondary | ICD-10-CM

## 2021-05-21 DIAGNOSIS — D122 Benign neoplasm of ascending colon: Secondary | ICD-10-CM

## 2021-05-21 MED ORDER — SODIUM CHLORIDE 0.9 % IV SOLN: 500.0000 mL | Freq: Once | INTRAVENOUS | Status: DC

## 2021-05-21 NOTE — Progress Notes (Signed)
Report to PACU, RN, vss, BBS= Clear.  

## 2021-05-21 NOTE — Progress Notes (Signed)
Called to room to assist during endoscopic procedure.  Patient ID and intended procedure confirmed with present staff. Received instructions for my participation in the procedure from the performing physician.  

## 2021-05-21 NOTE — Progress Notes (Signed)
Pt's states no medical or surgical changes since previsit or office visit. 

## 2021-05-21 NOTE — Op Note (Signed)
Home Patient Name: Becky Lara Procedure Date: 05/21/2021 1:07 PM MRN: [id number] Endoscopist: Docia Chuck. Henrene Pastor , MD Age: 65 Referring MD:  Date of Birth: February 02, 1956 Gender: Female Account #: [id number] Procedure:                Colonoscopy with cold snare polypectomy x 1 Indications:              High risk colon cancer surveillance: Personal                            history of non-advanced adenoma (index exam in                            April 2017) Medicines:                Monitored Anesthesia Care Procedure:                Pre-Anesthesia Assessment:                           - Prior to the procedure, a History and Physical                            was performed, and patient medications and                            allergies were reviewed. The patient's tolerance of                            previous anesthesia was also reviewed. The risks                            and benefits of the procedure and the sedation                            options and risks were discussed with the patient.                            All questions were answered, and informed consent                            was obtained. Prior Anticoagulants: The patient has                            taken no previous anticoagulant or antiplatelet                            agents. ASA Grade Assessment: II - A patient with                            mild systemic disease. After reviewing the risks                            and benefits, the patient was deemed in  satisfactory condition to undergo the procedure.                           After obtaining informed consent, the colonoscope                            was passed under direct vision. Throughout the                            procedure, the patient's blood pressure, pulse, and                            oxygen saturations were monitored continuously. The                            Olympus CF-HQ190L  ([id number]) Colonoscope was                            introduced through the anus and advanced to the the                            cecum, identified by appendiceal orifice and                            ileocecal valve. The ileocecal valve, appendiceal                            orifice, and rectum were photographed. The quality                            of the bowel preparation was excellent. The                            colonoscopy was performed without difficulty. The                            patient tolerated the procedure well. The bowel                            preparation used was Prepopik via split dose                            instruction. Scope In: 1:48:26 PM Scope Out: 2:08:44 PM Scope Withdrawal Time: 0 hours 13 minutes 47 seconds  Total Procedure Duration: 0 hours 20 minutes 18 seconds  Findings:                 A 5 mm polyp was found in the ascending colon. The                            polyp was sessile. The polyp was removed with a                            cold snare. Resection and retrieval were complete.  There was a rare diverticulum in the left colon.                            The exam was otherwise without abnormality on                            direct and retroflexion views. Internal hemorrhoids                            present Complications:            No immediate complications. Estimated blood loss:                            None. Estimated Blood Loss:     Estimated blood loss: none. Impression:               - One 5 mm polyp in the ascending colon, removed                            with a cold snare. Resected and retrieved.                           - Rare diverticulum                           - Internal hemorrhoids                           - The examination was otherwise normal on direct                            and retroflexion views. Recommendation:           - Repeat colonoscopy in 5 years for  surveillance.                           - Patient has a contact number available for                            emergencies. The signs and symptoms of potential                            delayed complications were discussed with the                            patient. Return to normal activities tomorrow.                            Written discharge instructions were provided to the                            patient.                           - Resume previous diet.                           -  Continue present medications.                           - Await pathology results. Docia Chuck. Henrene Pastor, MD 05/21/2021 2:22:06 PM This report has been signed electronically.

## 2021-05-21 NOTE — Patient Instructions (Signed)
Impression/Recommendations:  Polyp and hemorrhoid handouts given to patient.  Repeat colonoscopy in 5 years for surveillance.  Resume previous diet. Continue present medications. Await pathology results.  YOU HAD AN ENDOSCOPIC PROCEDURE TODAY AT [facility]:   Refer to the procedure report that was given to you for any specific questions about what was found during the examination.  If the procedure report does not answer your questions, please call your gastroenterologist to clarify.  If you requested that your care partner not be given the details of your procedure findings, then the procedure report has been included in a sealed envelope for you to review at your convenience later.  YOU SHOULD EXPECT: Some feelings of bloating in the abdomen. Passage of more gas than usual.  Walking can help get rid of the air that was put into your GI tract during the procedure and reduce the bloating. If you had a lower endoscopy (such as a colonoscopy or flexible sigmoidoscopy) you may notice spotting of blood in your stool or on the toilet paper. If you underwent a bowel prep for your procedure, you may not have a normal bowel movement for a few days.  Please Note:  You might notice some irritation and congestion in your nose or some drainage.  This is from the oxygen used during your procedure.  There is no need for concern and it should clear up in a day or so.  SYMPTOMS TO REPORT IMMEDIATELY:  Following lower endoscopy (colonoscopy or flexible sigmoidoscopy):  Excessive amounts of blood in the stool  Significant tenderness or worsening of abdominal pains  Swelling of the abdomen that is new, acute  Fever of 100F or higher For urgent or emergent issues, a gastroenterologist can be reached at any hour by calling [phone]. Do not use MyChart messaging for urgent concerns.    DIET:  We do recommend a small meal at first, but then you may proceed to your regular diet.  Drink  plenty of fluids but you should avoid alcoholic beverages for 24 hours.  ACTIVITY:  You should plan to take it easy for the rest of today and you should NOT DRIVE or use heavy machinery until tomorrow (because of the sedation medicines used during the test).    FOLLOW UP: Our staff will call the number listed on your records 48-72 hours following your procedure to check on you and address any questions or concerns that you may have regarding the information given to you following your procedure. If we do not reach you, we will leave a message.  We will attempt to reach you two times.  During this call, we will ask if you have developed any symptoms of COVID 19. If you develop any symptoms (ie: fever, flu-like symptoms, shortness of breath, cough etc.) before then, please call [phone].  If you test positive for Covid 19 in the 2 weeks post procedure, please call and report this information to [place].    If any biopsies were taken you will be contacted by phone or by letter within the next 1-3 weeks.  Please call us at [phone] if you have not heard about the biopsies in 3 weeks.    SIGNATURES/CONFIDENTIALITY: You and/or your care partner have signed paperwork which will be entered into your electronic medical record.  These signatures attest to the fact that that the information above on your After Visit Summary has been reviewed and is understood.  Full responsibility of the confidentiality of this discharge  information lies with you and/or your care-partner.

## 2021-05-25 ENCOUNTER — Telehealth: Payer: Self-pay | Admitting: *Deleted

## 2021-05-25 NOTE — Telephone Encounter (Signed)
Have you developed a fever since your procedure? no  2.   Have you had an respiratory symptoms (SOB or cough) since your procedure? no  3.   Have you tested positive for COVID 19 since your procedure no  4.   Have you had any family members/close contacts diagnosed with the COVID 19 since your procedure?  no   If yes to any of these questions please route to Joylene John, RN and Joella Prince, RN  Follow up Call-  Call back number 05/21/2021  Post procedure Call Back phone  # [phone]  Permission to leave phone message Yes  Some recent data might be hidden     Patient questions:  Do you have a fever, pain , or abdominal swelling? No. Pain Score  0 *  Have you tolerated food without any problems? Yes.    Have you been able to return to your normal activities? Yes.    Do you have any questions about your discharge instructions: Diet   No. Medications  No. Follow up visit  No.  Do you have questions or concerns about your Care? No.  Actions: * If pain score is 4 or above: No action needed, pain <4.

## 2021-05-27 ENCOUNTER — Encounter: Payer: Self-pay | Admitting: Internal Medicine

## 2021-06-09 ENCOUNTER — Other Ambulatory Visit: Payer: Self-pay

## 2021-06-09 ENCOUNTER — Ambulatory Visit
Admission: RE | Admit: 2021-06-09 | Discharge: 2021-06-09 | Disposition: A | Discharge status: Home / Self Care | Payer: BC Managed Care – PPO | Source: Ambulatory Visit | Attending: Family Medicine | Admitting: Family Medicine

## 2021-06-09 DIAGNOSIS — Z1231 Encounter for screening mammogram for malignant neoplasm of breast: Secondary | ICD-10-CM

## 2021-08-05 NOTE — Progress Notes (Signed)
Subjective:    Patient ID: Becky Lara, female    DOB: 01/01/1956, 65 y.o.   MRN: [id number]  HPI Chief Complaint  Patient presents with   Annual Exam   She is here for a complete physical exam.  Other providers: Dr. Scarlette Shorts - Gastroenterologist Dr. Eunice Blase - orthopedics Dr. Ila Mcgill - podiatrist   HTN- reports good compliance with amlodipine 10 mg and has been on this for years.   HL- taking statin daily and no concerns    Gout- no flare ups. Has been out of allopurinol for the past 2 months.    Osteopenia-last DEXA 07/31/2019.    Vitamin D def- taking 2,000 IUs daily   GERD- not an issue lately. Has not needed medication.     Social history: Lives with her boyfriend, works as retiring in 3 weeks, States she has cut back on alcohol. Still smoking cigarettes and marijuana.  Diet: eating fairly healthy  Excerise: joined a gym.   Immunizations: flu and Covid booster today  Health maintenance:   Mammogram: 05/2021 Colonoscopy: 04/2021 and due for 5 year recall  Last Gynecological Exam: DEXA: 07/2019 Last Dental Exam: years ago  Last Eye Exam:  2 years ago  Wears seatbelt always, smoke detectors in home and functioning, does not text while driving and feels safe in home environment.   Reviewed allergies, medications, past medical, surgical, family, and social history.    Review of Systems Review of Systems Constitutional: -fever, -chills, -sweats, -unexpected weight change,-fatigue ENT: -runny nose, -ear pain, -sore throat Cardiology:  -chest pain, -palpitations, -edema Respiratory: -cough, -shortness of breath, -wheezing Gastroenterology: -abdominal pain, -nausea, -vomiting, -diarrhea, -constipation  Hematology: -bleeding or bruising problems Musculoskeletal: -arthralgias, -myalgias, -joint swelling, -back pain Ophthalmology: -vision changes Urology: -dysuria, -difficulty urinating, -hematuria, -urinary frequency, -urgency Neurology:  -headache, -weakness, -tingling, -numbness       Objective:   Physical Exam BP 138/82 (BP Location: Right Arm, Patient Position: Sitting)   Pulse 75   Ht 5\' 5"  (1.651 m)   Wt 200 lb 3.2 oz (90.8 kg)   LMP 04/26/2008   SpO2 98%   BMI 33.32 kg/m   General Appearance:    Alert, cooperative, no distress, appears stated age  Head:    Normocephalic, without obvious abnormality, atraumatic  Eyes:    PERRL, conjunctiva/corneas clear, EOM's intact  Ears:    Normal TM's and external ear canals  Nose:   Mask on   Throat:   Mask on  Neck:   Supple, no lymphadenopathy;  thyroid:  no   enlargement/tenderness/nodules; no JVD  Back:    Spine nontender, no curvature, ROM normal, no CVA     tenderness  Lungs:     Clear to auscultation bilaterally without wheezes, rales or     ronchi; respirations unlabored  Chest Wall:    No tenderness or deformity   Heart:    Regular rate and rhythm, S1 and S2 normal, no murmur, rub   or gallop  Breast Exam:    Declines   Abdomen:     Soft, non-tender, nondistended, normoactive bowel sounds,    no masses, no hepatosplenomegaly  Genitalia:    Declines. Pap smear UTD     Extremities:   No clubbing, cyanosis or edema  Pulses:   2+ and symmetric all extremities  Skin:   Skin color, texture, turgor normal, no rashes or lesions  Lymph nodes:   Cervical, supraclavicular, and axillary nodes normal  Neurologic:   CNII-XII  intact, normal strength, sensation and gait          Psych:   Normal mood, affect, hygiene and grooming.        Assessment & Plan:  Routine general medical examination at a health care facility - Plan: CBC with Differential/Platelet, Comprehensive metabolic panel, TSH, T4, free -She is a pleasant 65 year old female who is in her usual state of health and here today for her fasting CPE.  States she is retiring soon.  She now has Medicare secondary. Preventive health care reviewed.  Up-to-date on mammogram, colonoscopy and will call and schedule her  bone density.  I recommend she stop smoking and continue to cut back on alcohol.  She is overdue for dental exam and I recommend she call [facility] dental school to get this done.  Follow-up with her eye doctor.  Encouraged healthy diet and getting at least 150 minutes of physical activity each week.  Immunizations reviewed.  She will receive high-dose flu vaccine as well as COVID booster today.  Counseling on potential side effects.  Discussed safety.   Elevated LDL cholesterol level - Plan: atorvastatin (LIPITOR) 10 MG tablet -Cholesterol has been well controlled.  Continue statin therapy.  Gastroesophageal reflux disease, unspecified whether esophagitis present - Plan: esomeprazole (NEXIUM) 40 MG capsule -Better controlled with avoiding food triggers.  Use medication as needed and not daily.  Osteopenia, unspecified location - Plan: DG Bone Density -Continue getting adequate calcium in her diet, taking a vitamin D supplement and weightbearing exercise.  Has been 2+ years since her previous bone density.  She will call and schedule and will need follow-up.  Estrogen deficiency - Plan: DG Bone Density  Smoker -Once again we discussed stopping smoking but she is not ready.  Vitamin D deficiency - Plan: VITAMIN D 25 Hydroxy (Vit-D Deficiency, Fractures) -Continue vitamin D supplement.  Follow-up pending vitamin D results  Obesity (BMI 30-39.9) - Plan: TSH, T4, free -Counseling on healthy diet and exercise for weight loss.  Essential hypertension - Plan: CBC with Differential/Platelet, Comprehensive metabolic panel -Fairly well controlled.  Continue current medications.  I recommend she start checking her blood pressure at home.  Follow-up if consistently out of goal range.  Idiopathic chronic gout without tophus, unspecified site - Plan: Uric acid -No gout flareups since her last visit even though her uric acid level was not at goal.  Continue allopurinol.  Follow-up pending uric acid  level.  She may need to increase allopurinol dose.

## 2021-08-05 NOTE — Patient Instructions (Addendum)
Call and schedule your bone density  Preventive Care 66 Years and Older, Female Preventive care refers to lifestyle choices and visits with your health care provider that can promote health and wellness. This includes: A yearly physical exam. This is also called an annual wellness visit. Regular dental and eye exams. Immunizations. Screening for certain conditions. Healthy lifestyle choices, such as: Eating a healthy diet. Getting regular exercise. Not using drugs or products that contain nicotine and tobacco. Limiting alcohol use. What can I expect for my preventive care visit? Physical exam Your health care provider will check your: Height and weight. These may be used to calculate your BMI (body mass index). BMI is a measurement that tells if you are at a healthy weight. Heart rate and blood pressure. Body temperature. Skin for abnormal spots. Counseling Your health care provider may ask you questions about your: Past medical problems. Family's medical history. Alcohol, tobacco, and drug use. Emotional well-being. Home life and relationship well-being. Sexual activity. Diet, exercise, and sleep habits. History of falls. Memory and ability to understand (cognition). Work and work Statistician. Pregnancy and menstrual history. Access to firearms. What immunizations do I need? Vaccines are usually given at various ages, according to a schedule. Your health care provider will recommend vaccines for you based on your age, medical history, and lifestyle or other factors, such as travel or where you work. What tests do I need? Blood tests Lipid and cholesterol levels. These may be checked every 5 years, or more often depending on your overall health. Hepatitis C test. Hepatitis B test. Screening Lung cancer screening. You may have this screening every year starting at age 24 if you have a 30-pack-year history of smoking and currently smoke or have quit within the past 15  years. Colorectal cancer screening. All adults should have this screening starting at age 61 and continuing until age 40. Your health care provider may recommend screening at age 45 if you are at increased risk. You will have tests every 1-10 years, depending on your results and the type of screening test. Diabetes screening. This is done by checking your blood sugar (glucose) after you have not eaten for a while (fasting). You may have this done every 1-3 years. Mammogram. This may be done every 1-2 years. Talk with your health care provider about how often you should have regular mammograms. Abdominal aortic aneurysm (AAA) screening. You may need this if you are a current or former smoker. BRCA-related cancer screening. This may be done if you have a family history of breast, ovarian, tubal, or peritoneal cancers. Other tests STD (sexually transmitted disease) testing, if you are at risk. Bone density scan. This is done to screen for osteoporosis. You may have this done starting at age 54. Talk with your health care provider about your test results, treatment options, and if necessary, the need for more tests. Follow these instructions at home: Eating and drinking  Eat a diet that includes fresh fruits and vegetables, whole grains, lean protein, and low-fat dairy products. Limit your intake of foods with high amounts of sugar, saturated fats, and salt. Take vitamin and mineral supplements as recommended by your health care provider. Do not drink alcohol if your health care provider tells you not to drink. If you drink alcohol: Limit how much you have to 0-1 drink a day. Be aware of how much alcohol is in your drink. In the [place] one drink equals one 12 oz bottle of beer (355 mL), one 5 oz  glass of wine (148 mL), or one 1 oz glass of hard liquor (44 mL). Lifestyle Take daily care of your teeth and gums. Brush your teeth every morning and night with fluoride toothpaste. Floss one time  each day. Stay active. Exercise for at least 30 minutes 5 or more days each week. Do not use any products that contain nicotine or tobacco, such as cigarettes, e-cigarettes, and chewing tobacco. If you need help quitting, ask your health care provider. Do not use drugs. If you are sexually active, practice safe sex. Use a condom or other form of protection in order to prevent STIs (sexually transmitted infections). Talk with your health care provider about taking a low-dose aspirin or statin. Find healthy ways to cope with stress, such as: Meditation, yoga, or listening to music. Journaling. Talking to a trusted person. Spending time with friends and family. Safety Always wear your seat belt while driving or riding in a vehicle. Do not drive: If you have been drinking alcohol. Do not ride with someone who has been drinking. When you are tired or distracted. While texting. Wear a helmet and other protective equipment during sports activities. If you have firearms in your house, make sure you follow all gun safety procedures. What's next? Visit your health care provider once a year for an annual wellness visit. Ask your health care provider how often you should have your eyes and teeth checked. Stay up to date on all vaccines. This information is not intended to replace advice given to you by your health care provider. Make sure you discuss any questions you have with your health care provider. Document Revised: 12/19/2020 Document Reviewed: 10/05/2018 Elsevier Patient Education  2022 [employer].

## 2021-08-06 ENCOUNTER — Encounter: Payer: Self-pay | Admitting: Family Medicine

## 2021-08-06 ENCOUNTER — Other Ambulatory Visit: Payer: Self-pay

## 2021-08-06 ENCOUNTER — Other Ambulatory Visit: Payer: Self-pay | Admitting: Family Medicine

## 2021-08-06 ENCOUNTER — Ambulatory Visit (INDEPENDENT_AMBULATORY_CARE_PROVIDER_SITE_OTHER): Payer: BC Managed Care – PPO | Admitting: Family Medicine

## 2021-08-06 VITALS — BP 138/82 | HR 75 | Ht 65.0 in | Wt 200.2 lb

## 2021-08-06 DIAGNOSIS — K219 Gastro-esophageal reflux disease without esophagitis: Secondary | ICD-10-CM

## 2021-08-06 DIAGNOSIS — M8580 Other specified disorders of bone density and structure, unspecified site: Secondary | ICD-10-CM

## 2021-08-06 DIAGNOSIS — E7800 Pure hypercholesterolemia, unspecified: Secondary | ICD-10-CM

## 2021-08-06 DIAGNOSIS — Z0000 Encounter for general adult medical examination without abnormal findings: Secondary | ICD-10-CM | POA: Diagnosis not present

## 2021-08-06 DIAGNOSIS — E669 Obesity, unspecified: Secondary | ICD-10-CM

## 2021-08-06 DIAGNOSIS — Z23 Encounter for immunization: Secondary | ICD-10-CM

## 2021-08-06 DIAGNOSIS — M1A00X0 Idiopathic chronic gout, unspecified site, without tophus (tophi): Secondary | ICD-10-CM

## 2021-08-06 DIAGNOSIS — E2839 Other primary ovarian failure: Secondary | ICD-10-CM

## 2021-08-06 DIAGNOSIS — E559 Vitamin D deficiency, unspecified: Secondary | ICD-10-CM

## 2021-08-06 DIAGNOSIS — F17200 Nicotine dependence, unspecified, uncomplicated: Secondary | ICD-10-CM

## 2021-08-06 DIAGNOSIS — I10 Essential (primary) hypertension: Secondary | ICD-10-CM

## 2021-08-06 MED ORDER — ATORVASTATIN CALCIUM 10 MG PO TABS: 10.0000 mg | ORAL_TABLET | Freq: Every day | ORAL | 1 refills | Status: DC

## 2021-08-06 MED ORDER — CLOBETASOL PROPIONATE 0.05 % EX CREA: TOPICAL_CREAM | Freq: Two times a day (BID) | CUTANEOUS | 1 refills | Status: DC

## 2021-08-06 MED ORDER — AMLODIPINE BESYLATE 10 MG PO TABS: 10.0000 mg | ORAL_TABLET | Freq: Every day | ORAL | 1 refills | Status: DC

## 2021-08-06 MED ORDER — ESOMEPRAZOLE MAGNESIUM 40 MG PO CPDR: 40.0000 mg | DELAYED_RELEASE_CAPSULE | Freq: Every day | ORAL | 1 refills | Status: DC

## 2021-08-06 NOTE — Addendum Note (Signed)
Addended by: Sheilah Pigeon A on: 08/06/2021 11:13 AM   Modules accepted: Orders

## 2021-08-07 LAB — URIC ACID: Uric Acid: 8.1 mg/dL — ABNORMAL HIGH (ref 3.0–7.2)

## 2021-08-07 LAB — CBC WITH DIFFERENTIAL/PLATELET
Basophils Absolute: 0.1 10*3/uL (ref 0.0–0.2)
Basos: 1 %
EOS (ABSOLUTE): 0.3 10*3/uL (ref 0.0–0.4)
Eos: 6 %
Hematocrit: 39.8 % (ref 34.0–46.6)
Hemoglobin: 13.4 g/dL (ref 11.1–15.9)
Immature Grans (Abs): 0 10*3/uL (ref 0.0–0.1)
Immature Granulocytes: 0 %
Lymphocytes Absolute: 1.5 10*3/uL (ref 0.7–3.1)
Lymphs: 26 %
MCH: 28.9 pg (ref 26.6–33.0)
MCHC: 33.7 g/dL (ref 31.5–35.7)
MCV: 86 fL (ref 79–97)
Monocytes Absolute: 0.3 10*3/uL (ref 0.1–0.9)
Monocytes: 6 %
Neutrophils Absolute: 3.5 10*3/uL (ref 1.4–7.0)
Neutrophils: 61 %
Platelets: 158 10*3/uL (ref 150–450)
RBC: 4.63 x10E6/uL (ref 3.77–5.28)
RDW: 12.5 % (ref 11.7–15.4)
WBC: 5.6 10*3/uL (ref 3.4–10.8)

## 2021-08-07 LAB — COMPREHENSIVE METABOLIC PANEL
ALT: 15 IU/L (ref 0–32)
AST: 21 IU/L (ref 0–40)
Albumin/Globulin Ratio: 1.5 (ref 1.2–2.2)
Albumin: 4.3 g/dL (ref 3.8–4.8)
Alkaline Phosphatase: 74 IU/L (ref 44–121)
BUN/Creatinine Ratio: 11 — ABNORMAL LOW (ref 12–28)
BUN: 11 mg/dL (ref 8–27)
Bilirubin Total: 0.7 mg/dL (ref 0.0–1.2)
CO2: 21 mmol/L (ref 20–29)
Calcium: 9.3 mg/dL (ref 8.7–10.3)
Chloride: 108 mmol/L — ABNORMAL HIGH (ref 96–106)
Creatinine, Ser: 0.96 mg/dL (ref 0.57–1.00)
Globulin, Total: 2.8 g/dL (ref 1.5–4.5)
Glucose: 91 mg/dL (ref 70–99)
Potassium: 4 mmol/L (ref 3.5–5.2)
Sodium: 144 mmol/L (ref 134–144)
Total Protein: 7.1 g/dL (ref 6.0–8.5)
eGFR: 65 mL/min/{1.73_m2} (ref >59)

## 2021-08-07 LAB — VITAMIN D 25 HYDROXY (VIT D DEFICIENCY, FRACTURES): Vit D, 25-Hydroxy: 29.9 ng/mL — ABNORMAL LOW (ref 30.0–100.0)

## 2021-08-07 LAB — T4, FREE: Free T4: 1.16 ng/dL (ref 0.82–1.77)

## 2021-08-07 LAB — TSH: TSH: 1.52 u[IU]/mL (ref 0.450–4.500)

## 2021-08-07 NOTE — Progress Notes (Signed)
Her uric acid is high which increases her risk of having a gout flare up. I recommend increasing the allopurinol to 200 mg daily and rechecking this in 2 months with Audelia Acton.  Make sure she is taking a vitamin D3 supplement 1,000 to 2,000 IUs daily is good.

## 2021-08-10 ENCOUNTER — Other Ambulatory Visit: Payer: Self-pay

## 2021-08-10 DIAGNOSIS — M1A00X0 Idiopathic chronic gout, unspecified site, without tophus (tophi): Secondary | ICD-10-CM

## 2021-08-10 MED ORDER — ALLOPURINOL 100 MG PO TABS: 100.0000 mg | ORAL_TABLET | Freq: Every day | ORAL | 1 refills | Status: DC

## 2021-08-19 ENCOUNTER — Encounter: Payer: BC Managed Care – PPO | Admitting: Family Medicine

## 2021-08-20 ENCOUNTER — Ambulatory Visit
Admission: RE | Admit: 2021-08-20 | Discharge: 2021-08-20 | Disposition: A | Discharge status: Home / Self Care | Payer: BC Managed Care – PPO | Source: Ambulatory Visit | Attending: Family Medicine | Admitting: Family Medicine

## 2021-08-20 ENCOUNTER — Other Ambulatory Visit: Payer: Self-pay

## 2021-08-20 DIAGNOSIS — M8580 Other specified disorders of bone density and structure, unspecified site: Secondary | ICD-10-CM

## 2021-08-20 DIAGNOSIS — E2839 Other primary ovarian failure: Secondary | ICD-10-CM

## 2021-10-07 ENCOUNTER — Ambulatory Visit: Payer: BC Managed Care – PPO | Admitting: Medical

## 2022-03-09 ENCOUNTER — Ambulatory Visit (INDEPENDENT_AMBULATORY_CARE_PROVIDER_SITE_OTHER): Payer: Medicare Other | Admitting: Physician Assistant

## 2022-03-09 ENCOUNTER — Encounter: Payer: Self-pay | Admitting: Physician Assistant

## 2022-03-09 VITALS — BP 150/90 | HR 68 | Ht 65.0 in | Wt 206.0 lb

## 2022-03-09 DIAGNOSIS — R251 Tremor, unspecified: Secondary | ICD-10-CM | POA: Insufficient documentation

## 2022-03-09 DIAGNOSIS — M79604 Pain in right leg: Secondary | ICD-10-CM | POA: Diagnosis not present

## 2022-03-09 DIAGNOSIS — E782 Mixed hyperlipidemia: Secondary | ICD-10-CM | POA: Insufficient documentation

## 2022-03-09 DIAGNOSIS — L309 Dermatitis, unspecified: Secondary | ICD-10-CM | POA: Insufficient documentation

## 2022-03-09 DIAGNOSIS — I10 Essential (primary) hypertension: Secondary | ICD-10-CM | POA: Insufficient documentation

## 2022-03-09 DIAGNOSIS — L308 Other specified dermatitis: Secondary | ICD-10-CM | POA: Diagnosis not present

## 2022-03-09 DIAGNOSIS — M79605 Pain in left leg: Secondary | ICD-10-CM

## 2022-03-09 DIAGNOSIS — M1A00X0 Idiopathic chronic gout, unspecified site, without tophus (tophi): Secondary | ICD-10-CM

## 2022-03-09 DIAGNOSIS — E559 Vitamin D deficiency, unspecified: Secondary | ICD-10-CM

## 2022-03-09 DIAGNOSIS — K219 Gastro-esophageal reflux disease without esophagitis: Secondary | ICD-10-CM | POA: Diagnosis not present

## 2022-03-09 HISTORY — DX: Gastro-esophageal reflux disease without esophagitis: K21.9

## 2022-03-09 MED ORDER — ALLOPURINOL 100 MG PO TABS: 100.0000 mg | ORAL_TABLET | Freq: Every day | ORAL | 2 refills | Status: DC

## 2022-03-09 MED ORDER — ESOMEPRAZOLE MAGNESIUM 40 MG PO CPDR: 40.0000 mg | DELAYED_RELEASE_CAPSULE | Freq: Every day | ORAL | 2 refills | Status: DC

## 2022-03-09 MED ORDER — CLOBETASOL PROPIONATE 0.05 % EX CREA: TOPICAL_CREAM | Freq: Two times a day (BID) | CUTANEOUS | 1 refills | Status: DC

## 2022-03-09 MED ORDER — AMLODIPINE BESYLATE 10 MG PO TABS: 10.0000 mg | ORAL_TABLET | Freq: Every day | ORAL | 2 refills | Status: DC

## 2022-03-09 MED ORDER — ATORVASTATIN CALCIUM 10 MG PO TABS: 10.0000 mg | ORAL_TABLET | Freq: Every day | ORAL | 2 refills | Status: DC

## 2022-03-09 NOTE — Patient Instructions (Signed)
You will get a call to schedule an appointment with Neurology ? ?

## 2022-03-09 NOTE — Assessment & Plan Note (Signed)
stable, avoid stomach irritants (tomatoes, oranges, lemons, limes, spicy/greasy food), try OTC gummies for MVI and vitamin C to be able to swallow them easier ? ?

## 2022-03-09 NOTE — Assessment & Plan Note (Signed)
controlled, eat a low salt diet, do not add any salt to food when cooking, avoid processed foods, avoid fried foods ? ?

## 2022-03-09 NOTE — Assessment & Plan Note (Signed)
Stable, avoid gout irritants ?

## 2022-03-09 NOTE — Assessment & Plan Note (Signed)
controlled, eat a low fat diet, increase fiber intake (Benefiber or Metamucil, Cherrios,  oatmeal, beans, nuts, fruits and vegetables), limit saturated fats (in fried foods, red meat), can add OTC fish oil supplement, eat fish with Omega-3 fatty acids like salmon and tuna, exercise for 30 minutes 3 - 5 times a week, drink 8 - 10 glasses of water a day ? ? ?

## 2022-03-09 NOTE — Assessment & Plan Note (Signed)
Stable, clobetasol cream refilled ?

## 2022-03-09 NOTE — Progress Notes (Signed)
? ?Established Patient Office Visit ? ?Subjective:  ?Patient ID: Becky Lara, female    DOB: [birth year]  Age: 66 y.o. MRN: [id number] ? ?CC:  ?Chief Complaint  ?Patient presents with  ? Medication Refill  ?  6 month med check. She is also trouble with her legs aching.  ? ? ?HPI ?Becky Lara presents for follow up of her multiple medical problems; reports bilateral hands shaking x 4 - 5 months, left worse than right, notices it more when she is using both hands and is on her phone; reports that she has trouble swallowing her large vitamin c and multivitamin OTC tablets; patient will try OTC gummies instead; also reports pain and aching in bilateral lower legs, denies fall or injury ? ?Outpatient Medications Prior to Visit  ?Medication Sig Dispense Refill  ? Ascorbic Acid (VITAMIN C) 500 MG CAPS Take by mouth.    ? aspirin EC 81 MG EC tablet Take 1 tablet (81 mg total) by mouth daily.    ? Cholecalciferol (VITAMIN D3 PO) Take 2,000 Units by mouth.    ? cyanocobalamin 1000 MCG tablet Take 1,000 mcg by mouth daily.    ? Glucosamine-Chondroitin (GLUCOSAMINE CHONDR COMPLEX PO) Take by mouth.    ? multivitamin-iron-minerals-folic acid (CENTRUM) chewable tablet Chew 1 tablet by mouth daily.    ? Turmeric 500 MG CAPS Take by mouth.    ? allopurinol (ZYLOPRIM) 100 MG tablet Take 1 tablet (100 mg total) by mouth daily. 90 tablet 1  ? amLODipine (NORVASC) 10 MG tablet Take 1 tablet (10 mg total) by mouth daily. 90 tablet 1  ? atorvastatin (LIPITOR) 10 MG tablet Take 1 tablet (10 mg total) by mouth daily. 90 tablet 1  ? clobetasol cream (TEMOVATE) 0.05 % Apply topically 2 (two) times daily. 30 g 1  ? esomeprazole (NEXIUM) 40 MG capsule Take 1 capsule (40 mg total) by mouth daily. 90 capsule 1  ? ?No facility-administered medications prior to visit.  ? ? ?No Known Allergies ? ?Patient Care Team: ?Marcellina Millin as PCP - General (Physician Assistant) ? ?ROS ?Review of Systems  ?Constitutional:  Negative for  activity change and chills.  ?HENT:  Negative for congestion and voice change.   ?Eyes:  Negative for pain and redness.  ?Respiratory:  Negative for cough and wheezing.   ?Cardiovascular:  Negative for chest pain.  ?Gastrointestinal:  Negative for constipation, diarrhea, nausea and vomiting.  ?Endocrine: Negative for polyuria.  ?Genitourinary:  Negative for frequency.  ?Skin:  Negative for color change and rash.  ?Allergic/Immunologic: Negative for immunocompromised state.  ?Neurological:  Negative for dizziness.  ?Psychiatric/Behavioral:  Negative for agitation.   ? ?  ?Objective:  ?  ?Physical Exam ?Vitals and nursing note reviewed.  ?Constitutional:   ?   General: She is not in acute distress. ?   Appearance: She is normal weight. She is not ill-appearing.  ?HENT:  ?   Head: Normocephalic and atraumatic.  ?   Right Ear: External ear normal.  ?   Left Ear: External ear normal.  ?Eyes:  ?   Extraocular Movements: Extraocular movements intact.  ?   Conjunctiva/sclera: Conjunctivae normal.  ?   Pupils: Pupils are equal, round, and reactive to light.  ?Cardiovascular:  ?   Rate and Rhythm: Normal rate and regular rhythm.  ?Pulmonary:  ?   Effort: Pulmonary effort is normal.  ?   Breath sounds: Normal breath sounds. No wheezing.  ?Abdominal:  ?   General: Bowel  sounds are normal.  ?   Palpations: Abdomen is soft.  ?Musculoskeletal:     ?   General: Normal range of motion.  ?   Cervical back: Normal range of motion.  ?Skin: ?   General: Skin is warm and dry.  ?Neurological:  ?   Mental Status: She is alert and oriented to person, place, and time.  ?Psychiatric:     ?   Mood and Affect: Mood normal.  ? ? ?BP (!) 150/90   Pulse 68   Ht 5' 5" (1.651 m)   Wt 206 lb (93.4 kg)   LMP 04/26/2008   SpO2 99%   BMI 34.28 kg/m?  ? ?BP Readings from Last 5 Encounters:  ?03/09/22 (!) 150/90  ?08/06/21 138/82  ?05/21/21 121/68  ?02/13/21 130/70  ?07/17/20 120/70  ? ? ? ?Wt Readings from Last 3 Encounters:  ?03/09/22 206 lb (93.4  kg)  ?08/06/21 200 lb 3.2 oz (90.8 kg)  ?05/21/21 201 lb (91.2 kg)  ? ? ?Results for orders placed or performed in visit on 08/06/21  ?CBC with Differential/Platelet  ?Result Value Ref Range  ? WBC 5.6 3.4 - 10.8 x10E3/uL  ? RBC 4.63 3.77 - 5.28 x10E6/uL  ? Hemoglobin 13.4 11.1 - 15.9 g/dL  ? Hematocrit 39.8 34.0 - 46.6 %  ? MCV 86 79 - 97 fL  ? MCH 28.9 26.6 - 33.0 pg  ? MCHC 33.7 31.5 - 35.7 g/dL  ? RDW 12.5 11.7 - 15.4 %  ? Platelets 158 150 - 450 x10E3/uL  ? Neutrophils 61 Not Estab. %  ? Lymphs 26 Not Estab. %  ? Monocytes 6 Not Estab. %  ? Eos 6 Not Estab. %  ? Basos 1 Not Estab. %  ? Neutrophils Absolute 3.5 1.4 - 7.0 x10E3/uL  ? Lymphocytes Absolute 1.5 0.7 - 3.1 x10E3/uL  ? Monocytes Absolute 0.3 0.1 - 0.9 x10E3/uL  ? EOS (ABSOLUTE) 0.3 0.0 - 0.4 x10E3/uL  ? Basophils Absolute 0.1 0.0 - 0.2 x10E3/uL  ? Immature Granulocytes 0 Not Estab. %  ? Immature Grans (Abs) 0.0 0.0 - 0.1 x10E3/uL  ?Comprehensive metabolic panel  ?Result Value Ref Range  ? Glucose 91 70 - 99 mg/dL  ? BUN 11 8 - 27 mg/dL  ? Creatinine, Ser 0.96 0.57 - 1.00 mg/dL  ? eGFR 65 >59 mL/min/1.73  ? BUN/Creatinine Ratio 11 (L) 12 - 28  ? Sodium 144 134 - 144 mmol/L  ? Potassium 4.0 3.5 - 5.2 mmol/L  ? Chloride 108 (H) 96 - 106 mmol/L  ? CO2 21 20 - 29 mmol/L  ? Calcium 9.3 8.7 - 10.3 mg/dL  ? Total Protein 7.1 6.0 - 8.5 g/dL  ? Albumin 4.3 3.8 - 4.8 g/dL  ? Globulin, Total 2.8 1.5 - 4.5 g/dL  ? Albumin/Globulin Ratio 1.5 1.2 - 2.2  ? Bilirubin Total 0.7 0.0 - 1.2 mg/dL  ? Alkaline Phosphatase 74 44 - 121 IU/L  ? AST 21 0 - 40 IU/L  ? ALT 15 0 - 32 IU/L  ?TSH  ?Result Value Ref Range  ? TSH 1.520 0.450 - 4.500 uIU/mL  ?T4, free  ?Result Value Ref Range  ? Free T4 1.16 0.82 - 1.77 ng/dL  ?VITAMIN D 25 Hydroxy (Vit-D Deficiency, Fractures)  ?Result Value Ref Range  ? Vit D, 25-Hydroxy 29.9 (L) 30.0 - 100.0 ng/mL  ?Uric acid  ?Result Value Ref Range  ? Uric Acid 8.1 (H) 3.0 - 7.2 mg/dL  ?  ? ?Last CBC ?  Lab Results  ?Component Value Date  ? WBC 5.6  08/06/2021  ? HGB 13.4 08/06/2021  ? HCT 39.8 08/06/2021  ? MCV 86 08/06/2021  ? MCH 28.9 08/06/2021  ? RDW 12.5 08/06/2021  ? PLT 158 08/06/2021  ? ?Last metabolic panel ?Lab Results  ?Component Value Date  ? GLUCOSE 91 08/06/2021  ? NA 144 08/06/2021  ? K 4.0 08/06/2021  ? CL 108 (H) 08/06/2021  ? CO2 21 08/06/2021  ? BUN 11 08/06/2021  ? CREATININE 0.96 08/06/2021  ? EGFR 65 08/06/2021  ? CALCIUM 9.3 08/06/2021  ? PROT 7.1 08/06/2021  ? ALBUMIN 4.3 08/06/2021  ? LABGLOB 2.8 08/06/2021  ? AGRATIO 1.5 08/06/2021  ? BILITOT 0.7 08/06/2021  ? ALKPHOS 74 08/06/2021  ? AST 21 08/06/2021  ? ALT 15 08/06/2021  ? ANIONGAP 11 12/19/2014  ? ?Last lipids ?Lab Results  ?Component Value Date  ? CHOL 154 02/13/2021  ? HDL 79 02/13/2021  ? [place] 64 02/13/2021  ? TRIG 50 02/13/2021  ? CHOLHDL 1.9 02/13/2021  ? ?Last hemoglobin A1c ?Lab Results  ?Component Value Date  ? HGBA1C 4.8 07/04/2018  ? ?Last thyroid functions ?Lab Results  ?Component Value Date  ? TSH 1.520 08/06/2021  ? T3TOTAL 112 07/17/2020  ? ?Last vitamin D ?Lab Results  ?Component Value Date  ? VD25OH 29.9 (L) 08/06/2021  ? ?Last vitamin B12 and Folate ?No results found for: VITAMINB12, FOLATE ?  ? ?The 10-year ASCVD risk score (Arnett DK, et al., 2019) is: 10.9% ? ?  ?Assessment & Plan:  ? ?Problem List Items Addressed This Visit   ? ?  ? Cardiovascular and Mediastinum  ? Primary hypertension - Primary  ?  controlled, eat a low salt diet, do not add any salt to food when cooking, avoid processed foods, avoid fried foods ? ? ?  ?  ? Relevant Medications  ? amLODipine (NORVASC) 10 MG tablet  ? atorvastatin (LIPITOR) 10 MG tablet  ?  ? Digestive  ? GERD without esophagitis  ?  stable, avoid stomach irritants (tomatoes, oranges, lemons, limes, spicy/greasy food), try OTC gummies for MVI and vitamin C to be able to swallow them easier ? ? ?  ?  ? Relevant Medications  ? esomeprazole (NEXIUM) 40 MG capsule  ?  ? Musculoskeletal and Integument  ? Eczema  ?  Stable,  clobetasol cream refilled ? ?  ?  ?  ? Other  ? Chronic gout  ?  Stable, avoid gout irritants ? ?  ?  ? Vitamin D deficiency  ? Relevant Orders  ? VITAMIN D 25 Hydroxy (Vit-D Deficiency, Fractures)  ? Mixed hyperlip

## 2022-03-10 LAB — COMPREHENSIVE METABOLIC PANEL
ALT: 11 IU/L (ref 0–32)
AST: 16 IU/L (ref 0–40)
Albumin/Globulin Ratio: 1.4 (ref 1.2–2.2)
Albumin: 4.4 g/dL (ref 3.8–4.8)
Alkaline Phosphatase: 75 IU/L (ref 44–121)
BUN/Creatinine Ratio: 10 — ABNORMAL LOW (ref 12–28)
BUN: 9 mg/dL (ref 8–27)
Bilirubin Total: 0.8 mg/dL (ref 0.0–1.2)
CO2: 20 mmol/L (ref 20–29)
Calcium: 9.3 mg/dL (ref 8.7–10.3)
Chloride: 104 mmol/L (ref 96–106)
Creatinine, Ser: 0.86 mg/dL (ref 0.57–1.00)
Globulin, Total: 3.1 g/dL (ref 1.5–4.5)
Glucose: 95 mg/dL (ref 70–99)
Potassium: 3.7 mmol/L (ref 3.5–5.2)
Sodium: 139 mmol/L (ref 134–144)
Total Protein: 7.5 g/dL (ref 6.0–8.5)
eGFR: 74 mL/min/{1.73_m2} (ref >59)

## 2022-03-10 LAB — THYROID PANEL WITH TSH
Free Thyroxine Index: 2.2 (ref 1.2–4.9)
T3 Uptake Ratio: 26 % (ref 24–39)
T4, Total: 8.3 ug/dL (ref 4.5–12.0)
TSH: 1.6 u[IU]/mL (ref 0.450–4.500)

## 2022-03-10 LAB — CBC WITH DIFFERENTIAL/PLATELET
Basophils Absolute: 0.1 10*3/uL (ref 0.0–0.2)
Basos: 1 %
EOS (ABSOLUTE): 0.3 10*3/uL (ref 0.0–0.4)
Eos: 4 %
Hematocrit: 40.6 % (ref 34.0–46.6)
Hemoglobin: 13.6 g/dL (ref 11.1–15.9)
Immature Grans (Abs): 0 10*3/uL (ref 0.0–0.1)
Immature Granulocytes: 0 %
Lymphocytes Absolute: 1.7 10*3/uL (ref 0.7–3.1)
Lymphs: 21 %
MCH: 29.6 pg (ref 26.6–33.0)
MCHC: 33.5 g/dL (ref 31.5–35.7)
MCV: 89 fL (ref 79–97)
Monocytes Absolute: 0.5 10*3/uL (ref 0.1–0.9)
Monocytes: 6 %
Neutrophils Absolute: 5.3 10*3/uL (ref 1.4–7.0)
Neutrophils: 68 %
Platelets: 158 10*3/uL (ref 150–450)
RBC: 4.59 x10E6/uL (ref 3.77–5.28)
RDW: 12.9 % (ref 11.7–15.4)
WBC: 7.8 10*3/uL (ref 3.4–10.8)

## 2022-03-10 LAB — VITAMIN D 25 HYDROXY (VIT D DEFICIENCY, FRACTURES): Vit D, 25-Hydroxy: 51.4 ng/mL (ref 30.0–100.0)

## 2022-03-11 NOTE — Progress Notes (Signed)
Please call patient to let her know -   Regarding your recent blood work:  Blood counts are fine, no anemia  Liver and kidneys are fine  Thyroid is balanced  Vitamin D is fine

## 2022-03-24 ENCOUNTER — Ambulatory Visit (INDEPENDENT_AMBULATORY_CARE_PROVIDER_SITE_OTHER): Payer: Medicare Other | Admitting: Neurology

## 2022-03-24 ENCOUNTER — Encounter: Payer: Self-pay | Admitting: Neurology

## 2022-03-24 VITALS — BP 141/80 | HR 95 | Ht 60.0 in | Wt 210.0 lb

## 2022-03-24 DIAGNOSIS — G20 Parkinson's disease: Secondary | ICD-10-CM | POA: Diagnosis not present

## 2022-03-24 DIAGNOSIS — R251 Tremor, unspecified: Secondary | ICD-10-CM

## 2022-03-24 DIAGNOSIS — G479 Sleep disorder, unspecified: Secondary | ICD-10-CM | POA: Diagnosis not present

## 2022-03-24 DIAGNOSIS — Z789 Other specified health status: Secondary | ICD-10-CM

## 2022-03-24 DIAGNOSIS — R269 Unspecified abnormalities of gait and mobility: Secondary | ICD-10-CM

## 2022-03-24 NOTE — Patient Instructions (Signed)
It was nice to meet you both today.  As discussed, I believe your symptoms and exam could point towards a Parkinson's-like syndrome.    You may mild Parkinson's disease, or have symptoms that can mimic parkinson's disease. This can affect your balance, your memory, your mood, your bowel and bladder function, your posture, balance and walking and your activities of daily living. However, we can consider supportive treatments and even medication down the road for symptomatic treatment.   I do want to suggest a few things today:  Remember to drink plenty of fluid at least 6 glasses (8 oz each), eat healthy meals and do not skip any meals. Try to eat protein with a every meal and eat a healthy snack such as fruit or nuts in between meals. Try to keep a regular sleep-wake schedule and try to exercise daily, particularly in the form of walking, 20-30 minutes a day, if you can.   Please work on reducing your caffeine intake.  Please also reduce your daily alcohol intake to limit yourself to less than 1 glass (6 ounces) of wine per day.   Try to stay active physically and mentally. Engage in social activities in your community and with your family and try to keep up with current events by reading the newspaper or watching the news. Try to do word puzzles and you may like to do puzzles and brain games on the computer such as on [email or web address]   As far as your medications are concerned, I would like hold off on any meds at this time.   As far as diagnostic testing, I will order a so-called DaT scan: This is a specialized brain scan designed to help with diagnosis of tremor disorders. A radioactive marker gets injected and the uptake is measured in the brain and compared to normal controls and right side is compared to the left, a change in uptake can help with diagnosis of certain tremor disorders. A brain MRI on the other hand is a brain scan that helps look at the brain structure in more detail overall and  look for age-related changes, blood vessel related changes and look for stroke and volume loss which we call atrophy.

## 2022-03-24 NOTE — Progress Notes (Signed)
Subjective:    Patient ID: Becky Lara is a 66 y.o. female.  HPI    Star Age, MD, PhD [facility] [address] [place], [place] [address]  Dear Becky Lara,   I saw your patient, Becky Lara, and your kind request in my neurologic clinic today for initial consultation of her tremors.  The patient is accompanied by her daughter today.  As you know, Becky Lara is a 66 year old right-handed woman with an underlying medical history of reflux disease, eczema, gout, hypertension, hyperlipidemia, hypokalemia, osteopenia, smoking, vitamin D deficiency, and obesity, who reports a several month history of intermittent left hand and arm tremors.  She reports that sometimes he feels the tremor inside but it is not always visible, sometimes she notices the tremor affecting her entire left arm when she tries to rest in bed at night.  She has not fallen but she fell out of bed about 2 months ago.  She hit her head against a chair and sustained a black eye but did not get checked out for this.  She has not noticed any changes with her balance, no family history of tremors or Parkinson's disease.  Daughter has noticed a change in her posture, she seems to be more stooped.  Patient tries to exercise regularly and goes to the gym about 4 times a week.  She drinks quite a bit of caffeine in the form of Lara, several cups per day and has been drinking quite a bit of alcohol in the form of wine, about 1 bottle per day on average.  She reports that she started drinking alcohol regularly after she retired in November 2022.  She was a Glass blower/designer.  She lives with her husband.  She also smokes marijuana.  She has 2 children, son lives in [place] and daughter lives in [place].  She is a non-smoker.  She denies any changes in her speech or handwriting.  Daughter has not noticed much in the way of change in her facial expression.  About 2 months ago she reports that she  yelled out in her sleep, her husband reported this to her, she does not recall dreaming at the time.  I reviewed your office note from 03/09/2022.  She reported a 4 to 74-monthhistory of hand tremors at the time.  She had blood work through your office at the time and I reviewed the results: Vitamin D was 51.4, CMP showed glucose of 95, BUN 9, creatinine 0.86, AST 16, ALT 11, alk phos 75, CBC with differential and platelets normal, TSH 1.6, total T48.3, within range.  She had a brain MRI without contrast on 12/19/2014 with indication of acute kidney injury, blurred vision.  I reviewed the results: Impression: Chronic microvascular ischemic changes.  No acute abnormality. She had CT without contrast on 12/19/2014 with indication of bilateral blurry vision and headaches for 1 month, hypertension.  I reviewed the results: Impression: Nonspecific diffuse white matter disease.  No acute intracranial hemorrhage or mass effect.   Her Past Medical History Is Significant For: Past Medical History:  Diagnosis Date   AKI ([place] kidney [place]) [place] 12/19/2014   Eczema    Esophageal reflux    Gastroesophageal reflux disease 03/09/2022   GERD (gastroesophageal reflux disease)    Gout    HTN (hypertension) 12/19/2014   Hyperlipidemia    Hypertension    Hypokalemia 12/19/2014   Osteopenia    Thoracic degenerative disc disease 12/08/2017   Per XR  Tobacco abuse    pt denies smoking cigarettes currently or as history, marijuana use only    Her Past Surgical History Is Significant For: Past Surgical History:  Procedure Laterality Date   COLONOSCOPY     2017   FOOT SURGERY     POLYPECTOMY     2017 TA x 1   TUBAL LIGATION      Her Family History Is Significant For: Family History  Problem Relation Age of Onset   Diabetes Mother    Heart attack Mother    Heart disease Mother 27       MI   Hyperlipidemia Mother    Heart disease Father 29       MI   Hypertension Brother    Alzheimer's disease  Maternal Grandmother    Hypertension Daughter    Hypertension Son    Colon cancer Neg Hx    Colon polyps Neg Hx    Esophageal cancer Neg Hx    Rectal cancer Neg Hx    Stomach cancer Neg Hx    Breast cancer Neg Hx    Tremor Neg Hx    Parkinson's disease Neg Hx     Her Social History Is Significant For: Social History   Socioeconomic History   Marital status: Single    Spouse name: Not on file   Number of children: Not on file   Years of education: Not on file   Highest education level: Not on file  Occupational History   Not on file  Tobacco Use   Smoking status: Former    Packs/day: 0.05    Types: Cigarettes    Quit date: 01/14/2016    Years since quitting: 6.1   Smokeless tobacco: Never   Tobacco comments:    Smokes Marijuana -since age 13  Substance and Sexual Activity   Alcohol use: Not Currently    Alcohol/week: 6.0 standard drinks    Types: 6 Glasses of wine per week    Comment: drinks a shot of vodka or glass of Chardonnay most days   Drug use: Yes    Types: Marijuana    Comment: marijuana---does not smoke cigarettes   Sexual activity: Yes    Partners: Male    Comment: 2 partners   Other Topics Concern   Not on file  Social History Narrative   Not on file   Social Determinants of Health   Financial Resource Strain: Not on file  Food Insecurity: Not on file  Transportation Needs: Not on file  Physical Activity: Not on file  Stress: Not on file  Social Connections: Not on file    Her Allergies Are:  No Known Allergies:   Her Current Medications Are:  Outpatient Encounter Medications as of 03/24/2022  Medication Sig   allopurinol (ZYLOPRIM) 100 MG tablet Take 1 tablet (100 mg total) by mouth daily.   amLODipine (NORVASC) 10 MG tablet Take 1 tablet (10 mg total) by mouth daily.   Ascorbic Acid (VITAMIN C) 500 MG CAPS Take by mouth.   aspirin EC 81 MG EC tablet Take 1 tablet (81 mg total) by mouth daily.   atorvastatin (LIPITOR) 10 MG tablet Take 1  tablet (10 mg total) by mouth daily.   Cholecalciferol (VITAMIN D3 PO) Take 2,000 Units by mouth.   clobetasol cream (TEMOVATE) 0.05 % Apply topically 2 (two) times daily.   cyanocobalamin 1000 MCG tablet Take 1,000 mcg by mouth daily.   esomeprazole (NEXIUM) 40 MG capsule Take 1 capsule (40 mg  total) by mouth daily.   Glucosamine-Chondroitin (GLUCOSAMINE CHONDR COMPLEX PO) Take by mouth.   multivitamin-iron-minerals-folic acid (CENTRUM) chewable tablet Chew 1 tablet by mouth daily.   Turmeric 500 MG CAPS Take by mouth.   No facility-administered encounter medications on file as of 03/24/2022.  :   Review of Systems:  Out of a complete 14 point review of systems, all are reviewed and negative with the exception of these symptoms as listed below:  Review of Systems  Neurological:        Pt is here for tremors Pt states tremors are in left  arm, hand  and mouth .    Objective:  Neurological Exam  Physical Exam Physical Examination:   Vitals:   03/24/22 1452  BP: (!) 141/80  Pulse: 95    General Examination: The patient is a very pleasant 66 y.o. female in no acute distress. She appears well-developed and well-nourished and well groomed.   HEENT: Normocephalic, atraumatic, pupils are equal, round and reactive to light, small pupils noted, mild bilateral cataracts.  Hearing grossly intact, face is symmetric with minimal facial masking and mild nuchal rigidity is noted, no lip, neck or jaw tremor.  Extraocular tracking shows mild saccadic breakdown with vertical gaze.  No nystagmus.  No carotid bruits.  Oropharynx exam reveals: Mouth dryness, adequate dental hygiene.  Tongue protrudes centrally and palate elevates symmetrically.    Chest: Clear to auscultation without wheezing, rhonchi or crackles noted.  Heart: S1+S2+0, regular and normal without murmurs, rubs or gallops noted.   Abdomen: Soft, non-tender and non-distended with normal bowel sounds appreciated on  auscultation.  Extremities: There is no pitting edema in the distal lower extremities bilaterally.   Skin: Warm and dry without trophic changes noted.   Musculoskeletal: exam reveals no obvious joint deformities, tenderness or joint swelling or erythema.   Neurologically:  Mental status: The patient is awake, alert and oriented in all 4 spheres. Her immediate and remote memory, attention, language skills and fund of knowledge are appropriate. There is no evidence of aphasia, agnosia, apraxia or anomia. Speech is clear with normal prosody and enunciation. Thought process is linear. Mood is normal and affect is normal.  Cranial nerves II - XII are as described above under HEENT exam.  On 03/24/2022: On Archimedes spiral drawing she has mild trembling with the left hand, mild insecurity with the right hand, handwriting with the right hand is legible, not tremulous, not micrographic.  Motor exam: Normal bulk, strength and tone is noted, with the exception of slight increase in tone in the left wrist with contralateral activation.   She has no significant resting tremor, mild intermittent left hand postural tremor, no action tremor, no intention tremor.  Fine motor skills are fairly well-preserved on both sides with the exception of slight slowness with the hand movements and finger taps and slight decrease in foot taps on the left.   Cerebellar testing: No dysmetria or intention tremor. There is no truncal or gait ataxia.  Mild difficulty with heel-to-shin with the left leg. Sensory exam: intact to light touch in the upper and lower extremities.  Gait, station and balance: She stands without difficulty, posture is fairly age-appropriate or slightly stooped for age, she walks with fairly good stride length and pace but slightly decreased arm swing on the left more than right.  She turns without issues.  Denies any lightheadedness upon standing.  Assessment and Plan:   In summary, SABREENA VOGAN is  a very pleasant 66  y.o.-year old female with an underlying medical history of reflux disease, eczema, gout, hypertension, hyperlipidemia, hypokalemia, osteopenia, smoking, vitamin D deficiency, and obesity, who presents for evaluation of her tremor disorder of several months duration.  She does not have any obvious resting tremor today.  She has a slight left hand postural tremor which is intermittent.  She has subtle changes in her fine motor exam posterior as well as arm swing on the left.  She may have very mild parkinsonism, not sure if she actually has idiopathic Parkinson's disease but we should continue to monitor.  I did not suggest any symptomatic treatment quite yet. I would like to proceed with a DaTscan for further diagnostic help.  I explained this scan to the patient and her daughter at length today.  She is agreeable to pursuing this.  We talked about lifestyle modification quite a bit as well.  She is strongly advised to refrain from drinking alcohol daily especially in the amount that she is consuming.  She is advised to reduce her alcohol consumption gradually to limit herself to less than 1 glass of wine per day, 6 ounce size.  Furthermore, she is encouraged to stop smoking marijuana and limit her caffeine intake as well.  We talked about the importance of healthy lifestyle, good nutrition, good hydration with water and daily exercise or regular exercise, she is doing well in that regard, goes to the gym 4 times a week.   We will call her with the DaT scan results and plan a follow-up afterwards.I answered all their questions today and the patient and her daughter were in agreement.   Thank you very much for allowing me to participate in the care of this nice patient. If I can be of any further assistance to you please do not hesitate to call me at [phone].  Sincerely,   Star Age, MD, PhD  This was an extended visit over 1 hour including chart review.

## 2022-04-07 ENCOUNTER — Telehealth: Payer: Self-pay | Admitting: Neurology

## 2022-04-07 NOTE — Telephone Encounter (Signed)
medicare/aarp NPR sent to [facility] nuclear medicine for scheduling

## 2022-04-16 ENCOUNTER — Encounter (HOSPITAL_COMMUNITY)
Admission: RE | Admit: 2022-04-16 | Discharge: 2022-04-16 | Disposition: A | Discharge status: Home / Self Care | Payer: Medicare Other | Source: Ambulatory Visit | Attending: Neurology | Admitting: Neurology

## 2022-04-16 DIAGNOSIS — G479 Sleep disorder, unspecified: Secondary | ICD-10-CM

## 2022-04-16 DIAGNOSIS — G20C Parkinsonism, unspecified: Secondary | ICD-10-CM

## 2022-04-16 DIAGNOSIS — R269 Unspecified abnormalities of gait and mobility: Secondary | ICD-10-CM

## 2022-04-16 DIAGNOSIS — G20 Parkinson's disease: Secondary | ICD-10-CM | POA: Diagnosis not present

## 2022-04-16 DIAGNOSIS — R251 Tremor, unspecified: Secondary | ICD-10-CM | POA: Diagnosis not present

## 2022-04-16 DIAGNOSIS — Z789 Other specified health status: Secondary | ICD-10-CM | POA: Diagnosis not present

## 2022-04-16 MED ORDER — POTASSIUM IODIDE (ANTIDOTE) 130 MG PO TABS
ORAL_TABLET | ORAL | Status: AC
  Filled 2022-04-16: qty 1

## 2022-04-16 MED ORDER — POTASSIUM IODIDE (ANTIDOTE) 130 MG PO TABS
130.0000 mg | ORAL_TABLET | Freq: Once | ORAL | Status: AC
  Administered 2022-04-16: 130 mg via ORAL

## 2022-04-20 DIAGNOSIS — G20 Parkinson's disease: Secondary | ICD-10-CM | POA: Diagnosis not present

## 2022-04-20 MED ORDER — IOFLUPANE I 123 185 MBQ/2.5ML IV SOLN
4.5000 | Freq: Once | INTRAVENOUS | Status: AC | PRN
Start: 2022-04-20 — End: 2022-04-20
  Administered 2022-04-20: 4.5 via INTRAVENOUS

## 2022-04-21 ENCOUNTER — Encounter: Payer: Self-pay | Admitting: Neurology

## 2022-04-22 ENCOUNTER — Encounter: Payer: Self-pay | Admitting: Neurology

## 2022-04-22 ENCOUNTER — Ambulatory Visit (INDEPENDENT_AMBULATORY_CARE_PROVIDER_SITE_OTHER): Payer: Medicare Other | Admitting: Neurology

## 2022-04-22 VITALS — BP 138/82 | HR 76 | Ht 66.0 in | Wt 206.0 lb

## 2022-04-22 DIAGNOSIS — G20 Parkinson's disease: Secondary | ICD-10-CM

## 2022-04-22 NOTE — Patient Instructions (Signed)
It was nice to see you both again today.  Your exam looks stable, your DaTscan would support an underlying parkinsonian syndrome.  As discussed, we can monitor your symptoms and exam a little bit longer.  Please continue to pursue a healthy lifestyle, avoid drinking alcohol altogether, limit caffeine to 1 or 2 servings per day, try to hydrate well with water, 6 to 8 cups of water per day, 8 ounce size each and are recommended daily for the average adult.  Please try to exercise regularly in the form of walking, resistance training, or mild cardio.  Follow-up in 6 months, sooner if needed.

## 2022-04-22 NOTE — Progress Notes (Signed)
Subjective:    Patient ID: Becky Lara is a 66 y.o. female.  HPI    Interim history:   Becky Lara is a 66 year old right-handed woman with an underlying medical history of reflux disease, eczema, gout, hypertension, hyperlipidemia, hypokalemia, osteopenia, smoking, vitamin D deficiency, and obesity, who presents for follow-up consultation of her tremor disorder, after an interim DaTscan.  The patient is unaccompanied today.  I first met her at the request of her primary care PA on 03/24/2022, at which time the patient reported a several month history of left hand tremors.  She had septal changes supporting mild parkinsonism.  She was advised to proceed with a DaTscan.  She had a nuclear medicine brain DaTscan on 04/16/2022 and I reviewed the results: IMPRESSION: Decreased radiotracer activity in the posterior RIGHT putamen. Normal activity elsewhere within the LEFT and RIGHT striatum. Recommend clinical correlation.   Of note, DaTSCAN is not diagnostic of Parkinsonian syndromes, which remains a clinical diagnosis. DaTscan is an adjuvant test to aid in the clinical diagnosis of Parkinsonian syndromes.   Today 04/22/2022: She reports feeling stable, no new symptoms.  She has been able to stop her daily alcohol use.  She reports no daily alcohol consumption currently, no daily caffeine.  She admits that she could do a little better with water intake.  She exercises regularly and goes to the gym about 4 times a week, she likes to walk, use a stationary bike and do some weight training as well.  She has had forgetfulness for the past couple of years.  She admits that she sometimes she does not remember a conversation or may ask a question again.  She has not fallen, denies any balance issues.  Tremor is disturbing on a day-to-day basis but noticeable primarily when she tries to relax at night in bed.  She has not smoked marijuana recently.  The patient's allergies, current medications, family  history, past medical history, past social history, past surgical history and problem list were reviewed and updated as appropriate.   Previously:   03/24/22: (She) reports a several month history of intermittent left hand and arm tremors.  She reports that sometimes he feels the tremor inside but it is not always visible, sometimes she notices the tremor affecting her entire left arm when she tries to rest in bed at night.  She has not fallen but she fell out of bed about 2 months ago.  She hit her head against a chair and sustained a black eye but did not get checked out for this.  She has not noticed any changes with her balance, no family history of tremors or Parkinson's disease.  Daughter has noticed a change in her posture, she seems to be more stooped.  Patient tries to exercise regularly and goes to the gym about 4 times a week.  She drinks quite a bit of caffeine in the form of soda, several cups per day and has been drinking quite a bit of alcohol in the form of wine, about 1 bottle per day on average.  She reports that she started drinking alcohol regularly after she retired in November 2022.  She was a Glass blower/designer.  She lives with her husband.  She also smokes marijuana.  She has 2 children, son lives in [place] and daughter lives in [place].  She is a non-smoker.  She denies any changes in her speech or handwriting.  Daughter has not noticed much in the way of change in her facial  expression.  About 2 months ago she reports that she yelled out in her sleep, her husband reported this to her, she does not recall dreaming at the time.   I reviewed your office note from 03/09/2022.  She reported a 4 to 10-monthhistory of hand tremors at the time.  She had blood work through your office at the time and I reviewed the results: Vitamin D was 51.4, CMP showed glucose of 95, BUN 9, creatinine 0.86, AST 16, ALT 11, alk phos 75, CBC with differential and platelets normal, TSH 1.6, total T48.3,  within range.  She had a brain MRI without contrast on 12/19/2014 with indication of acute kidney injury, blurred vision.  I reviewed the results: Impression: Chronic microvascular ischemic changes.  No acute abnormality. She had CT without contrast on 12/19/2014 with indication of bilateral blurry vision and headaches for 1 month, hypertension.  I reviewed the results: Impression: Nonspecific diffuse white matter disease.  No acute intracranial hemorrhage or mass effect.  Her Past Medical History Is Significant For: Past Medical History:  Diagnosis Date   AKI (acute kidney injury) ([facility] 12/19/2014   Eczema    Esophageal reflux    Gastroesophageal reflux disease 03/09/2022   GERD (gastroesophageal reflux disease)    Gout    HTN (hypertension) 12/19/2014   Hyperlipidemia    Hypertension    Hypokalemia 12/19/2014   Osteopenia    Thoracic degenerative disc disease 12/08/2017   Per XR   Tobacco abuse    pt denies smoking cigarettes currently or as history, marijuana use only    Her Past Surgical History Is Significant For: Past Surgical History:  Procedure Laterality Date   COLONOSCOPY     2017   FOOT SURGERY     POLYPECTOMY     2017 TA x 1   TUBAL LIGATION      Her Family History Is Significant For: Family History  Problem Relation Age of Onset   Diabetes Mother    Heart attack Mother    Heart disease Mother 658      MI   Hyperlipidemia Mother    Heart disease Father 751      MI   Hypertension Brother    Alzheimer's disease Maternal Grandmother    Hypertension Daughter    Hypertension Son    Colon cancer Neg Hx    Colon polyps Neg Hx    Esophageal cancer Neg Hx    Rectal cancer Neg Hx    Stomach cancer Neg Hx    Breast cancer Neg Hx    Tremor Neg Hx    Parkinson's disease Neg Hx     Her Social History Is Significant For: Social History   Socioeconomic History   Marital status: Single    Spouse name: Not on file   Number of children: Not on file   Years of  education: Not on file   Highest education level: Not on file  Occupational History   Not on file  Tobacco Use   Smoking status: Former    Packs/day: 0.05    Types: Cigarettes    Quit date: 01/14/2016    Years since quitting: 6.2   Smokeless tobacco: Never   Tobacco comments:    Smokes Marijuana -since age 89 Substance and Sexual Activity   Alcohol use: Not Currently    Alcohol/week: 6.0 standard drinks of alcohol    Types: 6 Glasses of wine per week    Comment: drinks a shot of  vodka or glass of Chardonnay most days   Drug use: Yes    Types: Marijuana    Comment: marijuana---does not smoke cigarettes   Sexual activity: Yes    Partners: Male    Comment: 2 partners   Other Topics Concern   Not on file  Social History Narrative   Not on file   Social Determinants of Health   Financial Resource Strain: Not on file  Food Insecurity: Not on file  Transportation Needs: Not on file  Physical Activity: Not on file  Stress: Not on file  Social Connections: Not on file    Her Allergies Are:  No Known Allergies:   Her Current Medications Are:  Outpatient Encounter Medications as of 04/22/2022  Medication Sig   allopurinol (ZYLOPRIM) 100 MG tablet Take 1 tablet (100 mg total) by mouth daily.   amLODipine (NORVASC) 10 MG tablet Take 1 tablet (10 mg total) by mouth daily.   Ascorbic Acid (VITAMIN C) 500 MG CAPS Take by mouth.   aspirin EC 81 MG EC tablet Take 1 tablet (81 mg total) by mouth daily.   atorvastatin (LIPITOR) 10 MG tablet Take 1 tablet (10 mg total) by mouth daily.   Cholecalciferol (VITAMIN D3 PO) Take 2,000 Units by mouth.   clobetasol cream (TEMOVATE) 0.05 % Apply topically 2 (two) times daily.   cyanocobalamin 1000 MCG tablet Take 1,000 mcg by mouth daily.   esomeprazole (NEXIUM) 40 MG capsule Take 1 capsule (40 mg total) by mouth daily.   Glucosamine-Chondroitin (GLUCOSAMINE CHONDR COMPLEX PO) Take by mouth.   multivitamin-iron-minerals-folic acid (CENTRUM)  chewable tablet Chew 1 tablet by mouth daily.   Turmeric 500 MG CAPS Take by mouth.   No facility-administered encounter medications on file as of 04/22/2022.  :  Review of Systems:  Out of a complete 14 point review of systems, all are reviewed and negative with the exception of these symptoms as listed below:  Review of Systems  Neurological:        Pt  is here for tremor follow up Pt states tremors in both hands and face . Pt states tremors same since last visit      Objective:  Neurological Exam  Physical Exam Physical Examination:   Vitals:   04/22/22 0923  BP: 138/82  Pulse: 76    General Examination: The patient is a very pleasant 66 y.o. female in no acute distress. She appears well-developed and well-nourished and well groomed.   HEENT: Normocephalic, atraumatic, pupils are equal, round and reactive to light, small pupils noted, mild bilateral cataracts.  Hearing grossly intact, face is symmetric with minimal facial masking and mild nuchal rigidity is noted, stable, no lip, neck or jaw tremor.  Extraocular tracking shows mild saccadic breakdown with vertical gaze.  No nystagmus.  No carotid bruits.  Oropharynx exam reveals: Mild mouth dryness, adequate dental hygiene.  Tongue protrudes centrally and palate elevates symmetrically.     Chest: Clear to auscultation without wheezing, rhonchi or crackles noted.   Heart: S1+S2+0, regular and normal without murmurs, rubs or gallops noted.    Abdomen: Soft, non-tender and non-distended.   Extremities: There is no pitting edema in the distal lower extremities bilaterally.    Skin: Warm and dry without trophic changes noted.    Musculoskeletal: exam reveals no obvious joint deformities, tenderness or joint swelling or erythema.    Neurologically:  Mental status: The patient is awake, alert and oriented in all 4 spheres. Her immediate and remote memory, attention,  language skills and fund of knowledge are appropriate. There  is no evidence of aphasia, agnosia, apraxia or anomia. Speech is clear with normal prosody and enunciation. Thought process is linear. Mood is normal and affect is normal.  Cranial nerves II - XII are as described above under HEENT exam.   (On 03/24/2022: On Archimedes spiral drawing she has mild trembling with the left hand, mild insecurity with the right hand, handwriting with the right hand is legible, not tremulous, not micrographic.)   Motor exam: Normal bulk, strength and tone is noted, with the exception of slight increase in tone in the left wrist and forearm.  No telltale cogwheeling.    She has no significant resting tremor, mild intermittent left hand postural tremor, no action tremor, no intention tremor.  Fine motor skills are fairly well-preserved on both sides with the exception of slight slowness with the hand movements and finger taps and slight decrease in foot taps on the left, all stable.   Cerebellar testing: No dysmetria or intention tremor. There is no truncal or gait ataxia.  No difficulty with finger-to-nose.    Sensory exam: intact to light touch in the upper and lower extremities.  Gait, station and balance: She stands without difficulty, posture is fairly age-appropriate or slightly stooped for age, she walks with fairly good stride length and pace but slightly decreased arm swing on the left more than right.  She turns without issues. She denies any lightheadedness upon standing.   Assessment and Plan:    In summary, ANALILIA GEDDIS is a very pleasant 66 year old female with an underlying medical history of reflux disease, eczema, gout, hypertension, hyperlipidemia, hypokalemia, osteopenia, smoking, vitamin D deficiency, and obesity, who presents for follow-up consultation of her parkinsonism with left upper extremity tremor history for the past several months.  Examination shows mild left-sided parkinsonism.  She had a recent DaTscan on 04/16/2022 which was supportive of  an underlying parkinsonian syndrome with decreased radiotracer uptake in the posterior right putamen.  We talked about the possibility of utilizing symptomatic treatment options.  We talked about the progressive nature of parkinsonism and Parkinson's disease and the benefits of pursuing a healthy lifestyle.  She has made some adjustments and modifications to her day-to-day habits.  She is congratulated on being able to stop drinking alcohol daily.  She is advised to avoid drinking alcohol and limit her caffeine intake, stay well-hydrated with water and continue to pursue regular exercise on a day-to-day basis or about 150 minutes of modest exercise per week. We mutually agreed to return her symptoms and examination at this time and not start any new/symptomatic medication.  She is advised to follow-up routinely in 6 months in this clinic, sooner if needed.  I answered all the questions today and the patient and her daughter were in agreement.  I spent 30 minutes in total face-to-face time and in reviewing records during pre-charting, more than 50% of which was spent in counseling and coordination of care, reviewing test results, reviewing medications and treatment regimen and/or in discussing or reviewing the diagnosis of parkinsonism, the prognosis and treatment options. Pertinent laboratory and imaging test results that were available during this visit with the patient were reviewed by me and considered in my medical decision making (see chart for details).

## 2022-04-23 ENCOUNTER — Encounter: Payer: Self-pay | Admitting: Internal Medicine

## 2022-06-21 ENCOUNTER — Ambulatory Visit: Payer: Medicare Other | Admitting: Neurology

## 2022-06-24 ENCOUNTER — Other Ambulatory Visit: Payer: Self-pay | Admitting: Physician Assistant

## 2022-06-24 DIAGNOSIS — Z1231 Encounter for screening mammogram for malignant neoplasm of breast: Secondary | ICD-10-CM

## 2022-06-30 ENCOUNTER — Encounter: Payer: Self-pay | Admitting: Internal Medicine

## 2022-07-02 ENCOUNTER — Other Ambulatory Visit (INDEPENDENT_AMBULATORY_CARE_PROVIDER_SITE_OTHER): Payer: Medicare Other

## 2022-07-02 DIAGNOSIS — Z23 Encounter for immunization: Secondary | ICD-10-CM | POA: Diagnosis not present

## 2022-07-14 ENCOUNTER — Ambulatory Visit
Admission: RE | Admit: 2022-07-14 | Discharge: 2022-07-14 | Disposition: A | Discharge status: Home / Self Care | Payer: Medicare Other | Source: Ambulatory Visit | Attending: Physician Assistant | Admitting: Physician Assistant

## 2022-07-14 DIAGNOSIS — Z1231 Encounter for screening mammogram for malignant neoplasm of breast: Secondary | ICD-10-CM | POA: Diagnosis not present

## 2022-08-03 ENCOUNTER — Encounter: Payer: Self-pay | Admitting: Internal Medicine

## 2022-08-11 ENCOUNTER — Other Ambulatory Visit (INDEPENDENT_AMBULATORY_CARE_PROVIDER_SITE_OTHER): Payer: Medicare Other

## 2022-08-11 DIAGNOSIS — Z23 Encounter for immunization: Secondary | ICD-10-CM | POA: Diagnosis not present

## 2022-09-07 ENCOUNTER — Ambulatory Visit: Payer: Medicare Other | Admitting: Physician Assistant

## 2022-09-07 ENCOUNTER — Encounter: Payer: Self-pay | Admitting: Internal Medicine

## 2022-09-27 ENCOUNTER — Encounter: Payer: Medicare Other | Admitting: Family Medicine

## 2022-10-05 ENCOUNTER — Ambulatory Visit (INDEPENDENT_AMBULATORY_CARE_PROVIDER_SITE_OTHER): Payer: Medicare Other | Admitting: Family Medicine

## 2022-10-05 ENCOUNTER — Encounter: Payer: Self-pay | Admitting: Family Medicine

## 2022-10-05 VITALS — BP 142/94 | HR 93 | Temp 97.8°F | Ht 66.0 in | Wt 223.0 lb

## 2022-10-05 DIAGNOSIS — E782 Mixed hyperlipidemia: Secondary | ICD-10-CM

## 2022-10-05 DIAGNOSIS — M1A00X0 Idiopathic chronic gout, unspecified site, without tophus (tophi): Secondary | ICD-10-CM

## 2022-10-05 DIAGNOSIS — E559 Vitamin D deficiency, unspecified: Secondary | ICD-10-CM

## 2022-10-05 DIAGNOSIS — I10 Essential (primary) hypertension: Secondary | ICD-10-CM | POA: Diagnosis not present

## 2022-10-05 DIAGNOSIS — G20C Parkinsonism, unspecified: Secondary | ICD-10-CM | POA: Diagnosis not present

## 2022-10-05 DIAGNOSIS — M8580 Other specified disorders of bone density and structure, unspecified site: Secondary | ICD-10-CM | POA: Diagnosis not present

## 2022-10-05 LAB — LIPID PANEL
Cholesterol: 192 mg/dL (ref 0–200)
HDL: 62.6 mg/dL (ref >39.00)
LDL Cholesterol: 111 mg/dL — ABNORMAL HIGH (ref 0–99)
NonHDL: 129.75
Total CHOL/HDL Ratio: 3
Triglycerides: 94 mg/dL (ref 0.0–149.0)
VLDL: 18.8 mg/dL (ref 0.0–40.0)

## 2022-10-05 LAB — COMPREHENSIVE METABOLIC PANEL
ALT: 12 U/L (ref 0–35)
AST: 16 U/L (ref 0–37)
Albumin: 4.2 g/dL (ref 3.5–5.2)
Alkaline Phosphatase: 91 U/L (ref 39–117)
BUN: 12 mg/dL (ref 6–23)
CO2: 27 mEq/L (ref 19–32)
Calcium: 9.6 mg/dL (ref 8.4–10.5)
Chloride: 105 mEq/L (ref 96–112)
Creatinine, Ser: 1.04 mg/dL (ref 0.40–1.20)
GFR: 55.49 mL/min — ABNORMAL LOW (ref >60.00)
Glucose, Bld: 96 mg/dL (ref 70–99)
Potassium: 3.9 mEq/L (ref 3.5–5.1)
Sodium: 139 mEq/L (ref 135–145)
Total Bilirubin: 0.7 mg/dL (ref 0.2–1.2)
Total Protein: 8 g/dL (ref 6.0–8.3)

## 2022-10-05 LAB — CBC WITH DIFFERENTIAL/PLATELET
Basophils Absolute: 0 10*3/uL (ref 0.0–0.1)
Basophils Relative: 0.7 % (ref 0.0–3.0)
Eosinophils Absolute: 0.3 10*3/uL (ref 0.0–0.7)
Eosinophils Relative: 4.2 % (ref 0.0–5.0)
HCT: 42 % (ref 36.0–46.0)
Hemoglobin: 14 g/dL (ref 12.0–15.0)
Lymphocytes Relative: 24.1 % (ref 12.0–46.0)
Lymphs Abs: 1.5 10*3/uL (ref 0.7–4.0)
MCHC: 33.3 g/dL (ref 30.0–36.0)
MCV: 85.7 fl (ref 78.0–100.0)
Monocytes Absolute: 0.4 10*3/uL (ref 0.1–1.0)
Monocytes Relative: 7 % (ref 3.0–12.0)
Neutro Abs: 4.1 10*3/uL (ref 1.4–7.7)
Neutrophils Relative %: 64 % (ref 43.0–77.0)
Platelets: 187 10*3/uL (ref 150.0–400.0)
RBC: 4.91 Mil/uL (ref 3.87–5.11)
RDW: 13.7 % (ref 11.5–15.5)
WBC: 6.4 10*3/uL (ref 4.0–10.5)

## 2022-10-05 LAB — VITAMIN D 25 HYDROXY (VIT D DEFICIENCY, FRACTURES): VITD: 47.56 ng/mL (ref 30.00–100.00)

## 2022-10-05 LAB — URIC ACID: Uric Acid, Serum: 7.3 mg/dL — ABNORMAL HIGH (ref 2.4–7.0)

## 2022-10-05 MED ORDER — AMLODIPINE BESYLATE 10 MG PO TABS: 10.0000 mg | ORAL_TABLET | Freq: Every day | ORAL | 1 refills | Status: DC

## 2022-10-05 MED ORDER — ALLOPURINOL 100 MG PO TABS: 100.0000 mg | ORAL_TABLET | Freq: Every day | ORAL | 1 refills | Status: DC

## 2022-10-05 MED ORDER — ATORVASTATIN CALCIUM 10 MG PO TABS: 10.0000 mg | ORAL_TABLET | Freq: Every day | ORAL | 2 refills | Status: DC

## 2022-10-05 NOTE — Progress Notes (Signed)
Subjective:     Patient ID: Becky Lara, female    DOB: 03/13/1956, 66 y.o.   MRN: [id number]  Chief Complaint  Patient presents with   Transitions Of Care    Wanted to come back to     HPI Patient is in today to re-establish care with me.   States she retired in November 2023. States she stopped drinking and smoking marijuana.   She was diagnosed with Parkinsons in June 2023. Has a neurology f/u next month. Is not on medication for this.  Slight tremor in face and hands.    HTN- stopped her medication.   No gout flares. Stopped allopurinol.   HL- stopped statin  Osteopenia- DEXA UTD.  Is not taking vitamin D currently.   Denies fever, chills, dizziness, chest pain, palpitations, shortness of breath, abdominal pain, N/V/D, urinary symptoms, LE edema.     Health Maintenance Due  Topic Date Due   Medicare Annual Wellness (AWV)  Never done   Pneumonia Vaccine 85+ Years old (50 - PPSV23 or PCV20) 07/17/2021    Past Medical History:  Diagnosis Date   AKI (acute kidney injury) ([facility]) 12/19/2014   Eczema    Esophageal reflux    Gastroesophageal reflux disease 03/09/2022   GERD (gastroesophageal reflux disease)    Gout    HTN (hypertension) 12/19/2014   Hyperlipidemia    Hypertension    Hypokalemia 12/19/2014   Osteopenia    Thoracic degenerative disc disease 12/08/2017   Per XR   Tobacco abuse    pt denies smoking cigarettes currently or as history, marijuana use only    Past Surgical History:  Procedure Laterality Date   COLONOSCOPY     2017   FOOT SURGERY     POLYPECTOMY     2017 TA x 1   TUBAL LIGATION      Family History  Problem Relation Age of Onset   Diabetes Mother    Heart attack Mother    Heart disease Mother 27       MI   Hyperlipidemia Mother    Heart disease Father 6       MI   Hypertension Brother    Alzheimer's disease Maternal Grandmother    Hypertension Daughter    Hypertension Son    Colon cancer Neg Hx    Colon  polyps Neg Hx    Esophageal cancer Neg Hx    Rectal cancer Neg Hx    Stomach cancer Neg Hx    Breast cancer Neg Hx    Tremor Neg Hx    Parkinson's disease Neg Hx     Social History   Socioeconomic History   Marital status: Single    Spouse name: Not on file   Number of children: Not on file   Years of education: Not on file   Highest education level: Not on file  Occupational History   Not on file  Tobacco Use   Smoking status: Former    Packs/day: 0.05    Types: Cigarettes    Quit date: 01/14/2016    Years since quitting: 6.7   Smokeless tobacco: Never   Tobacco comments:    Smokes Marijuana -since age 20  Substance and Sexual Activity   Alcohol use: Not Currently    Alcohol/week: 6.0 standard drinks of alcohol    Types: 6 Glasses of wine per week    Comment: stopped alcohol   Drug use: Not Currently    Types: Marijuana  Comment: marijuana---does not smoke cigarettes   Sexual activity: Yes    Partners: Male    Comment: 2 partners   Other Topics Concern   Not on file  Social History Narrative   Not on file   Social Determinants of Health   Financial Resource Strain: Not on file  Food Insecurity: Not on file  Transportation Needs: Not on file  Physical Activity: Not on file  Stress: Not on file  Social Connections: Not on file  Intimate Partner Violence: Not on file    Outpatient Medications Prior to Visit  Medication Sig Dispense Refill   Ascorbic Acid (VITAMIN C) 500 MG CAPS Take by mouth. (Patient not taking: Reported on 10/05/2022)     aspirin EC 81 MG EC tablet Take 1 tablet (81 mg total) by mouth daily. (Patient not taking: Reported on 10/05/2022)     Cholecalciferol (VITAMIN D3 PO) Take 2,000 Units by mouth. (Patient not taking: Reported on 10/05/2022)     clobetasol cream (TEMOVATE) 0.05 % Apply topically 2 (two) times daily. (Patient not taking: Reported on 10/05/2022) 30 g 1   cyanocobalamin 1000 MCG tablet Take 1,000 mcg by mouth daily. (Patient  not taking: Reported on 10/05/2022)     esomeprazole (NEXIUM) 40 MG capsule Take 1 capsule (40 mg total) by mouth daily. (Patient not taking: Reported on 10/05/2022) 90 capsule 2   Glucosamine-Chondroitin (GLUCOSAMINE CHONDR COMPLEX PO) Take by mouth. (Patient not taking: Reported on 10/05/2022)     multivitamin-iron-minerals-folic acid (CENTRUM) chewable tablet Chew 1 tablet by mouth daily. (Patient not taking: Reported on 10/05/2022)     Turmeric 500 MG CAPS Take by mouth. (Patient not taking: Reported on 10/05/2022)     allopurinol (ZYLOPRIM) 100 MG tablet Take 1 tablet (100 mg total) by mouth daily. (Patient not taking: Reported on 10/05/2022) 90 tablet 2   amLODipine (NORVASC) 10 MG tablet Take 1 tablet (10 mg total) by mouth daily. (Patient not taking: Reported on 10/05/2022) 90 tablet 2   atorvastatin (LIPITOR) 10 MG tablet Take 1 tablet (10 mg total) by mouth daily. (Patient not taking: Reported on 10/05/2022) 90 tablet 2   No facility-administered medications prior to visit.    No Known Allergies  ROS     Objective:    Physical Exam Constitutional:      General: She is not in acute distress.    Appearance: She is not ill-appearing.  Cardiovascular:     Rate and Rhythm: Normal rate and regular rhythm.  Pulmonary:     Effort: Pulmonary effort is normal.     Breath sounds: Normal breath sounds.  Musculoskeletal:     Right lower leg: No edema.     Left lower leg: No edema.  Skin:    General: Skin is warm and dry.  Neurological:     General: No focal deficit present.     Mental Status: She is alert and oriented to person, place, and time.  Psychiatric:        Mood and Affect: Mood normal.        Behavior: Behavior normal.        Thought Content: Thought content normal.     BP (!) 142/94 (BP Location: Left Arm, Patient Position: Sitting, Cuff Size: Large)   Pulse 93   Temp 97.8 F (36.6 C) (Temporal)   Ht '5\' 6"'$  (1.676 m)   Wt 223 lb (101.2 kg)   LMP 04/26/2008    SpO2 97%   BMI 35.99 kg/m  Wt  Readings from Last 3 Encounters:  10/05/22 223 lb (101.2 kg)  04/22/22 206 lb (93.4 kg)  03/24/22 210 lb (95.3 kg)       Assessment & Plan:   Problem List Items Addressed This Visit       Cardiovascular and Mediastinum   Primary hypertension - Primary    Uncontrolled and has been off of her medications. Restart amlodipine. Low sodium diet. Follow up with BP readings in office in 4 weeks.  Check BMP      Relevant Medications   amLODipine (NORVASC) 10 MG tablet   atorvastatin (LIPITOR) 10 MG tablet   Other Relevant Orders   CBC with Differential/Platelet (Completed)   Comprehensive metabolic panel (Completed)     Nervous and Auditory   Parkinsonism    New diagnosis since I last saw her. Follow up with neurologist as scheduled next month.         Musculoskeletal and Integument   Osteopenia    Last DEXA 07/2021. Counseling on getting adequate calcium 1,200 mg and taking vitamin D 1,000 IUs daily. Weight bearing exercise as well. Recheck DEXA in 07/2023.         Other   Chronic gout    Stopped allopurinol. No recent gout flares. Check uric acid level and start back on allopurinol. Avoid triggers. Follow up pending results.       Relevant Medications   allopurinol (ZYLOPRIM) 100 MG tablet   Other Relevant Orders   Uric acid (Completed)   Mixed hyperlipidemia    She has been off of her statin. Check lipid panel and start back on atorvastatin.       Relevant Medications   amLODipine (NORVASC) 10 MG tablet   atorvastatin (LIPITOR) 10 MG tablet   Other Relevant Orders   Lipid panel (Completed)   Vitamin D deficiency    Check vitamin D level and follow up      Relevant Orders   VITAMIN D 25 Hydroxy (Vit-D Deficiency, Fractures) (Completed)    I am having Megan Salon. Quentin Cornwall maintain her aspirin EC, Cholecalciferol (VITAMIN D3 PO), Vitamin C, multivitamin-iron-minerals-folic acid, cyanocobalamin, Turmeric, Glucosamine-Chondroitin  (GLUCOSAMINE CHONDR COMPLEX PO), esomeprazole, clobetasol cream, amLODipine, atorvastatin, and allopurinol.  Meds ordered this encounter  Medications   amLODipine (NORVASC) 10 MG tablet    Sig: Take 1 tablet (10 mg total) by mouth daily.    Dispense:  90 tablet    Refill:  1    Order Specific Question:   Supervising Provider    Answer:   Pricilla Holm A [[id number]]   atorvastatin (LIPITOR) 10 MG tablet    Sig: Take 1 tablet (10 mg total) by mouth daily.    Dispense:  90 tablet    Refill:  2    Order Specific Question:   Supervising Provider    Answer:   Pricilla Holm A [[id number]]   allopurinol (ZYLOPRIM) 100 MG tablet    Sig: Take 1 tablet (100 mg total) by mouth daily.    Dispense:  90 tablet    Refill:  1    Order Specific Question:   Supervising Provider    Answer:   Pricilla Holm A [[id number]]

## 2022-10-05 NOTE — Patient Instructions (Addendum)
It was great seeing you today.   Please go downstairs to the lab before you leave today.   Start back on your medications.   Check your blood pressure at home. Follow up with me in 4 weeks with your blood pressure readings.

## 2022-10-05 NOTE — Assessment & Plan Note (Signed)
New diagnosis since I last saw her. Follow up with neurologist as scheduled next month.

## 2022-10-05 NOTE — Assessment & Plan Note (Signed)
She has been off of her statin. Check lipid panel and start back on atorvastatin.

## 2022-10-05 NOTE — Assessment & Plan Note (Signed)
Last DEXA 07/2021. Counseling on getting adequate calcium 1,200 mg and taking vitamin D 1,000 IUs daily. Weight bearing exercise as well. Recheck DEXA in 07/2023.

## 2022-10-05 NOTE — Assessment & Plan Note (Signed)
Uncontrolled and has been off of her medications. Restart amlodipine. Low sodium diet. Follow up with BP readings in office in 4 weeks.  Check BMP

## 2022-10-05 NOTE — Assessment & Plan Note (Signed)
Check vitamin D level and follow-up 

## 2022-10-05 NOTE — Assessment & Plan Note (Signed)
Stopped allopurinol. No recent gout flares. Check uric acid level and start back on allopurinol. Avoid triggers. Follow up pending results.

## 2022-10-28 ENCOUNTER — Encounter: Payer: Self-pay | Admitting: Neurology

## 2022-10-28 ENCOUNTER — Ambulatory Visit (INDEPENDENT_AMBULATORY_CARE_PROVIDER_SITE_OTHER): Payer: Medicare Other | Admitting: Neurology

## 2022-10-28 VITALS — BP 139/88 | HR 81 | Ht 65.5 in | Wt 227.6 lb

## 2022-10-28 DIAGNOSIS — G20C Parkinsonism, unspecified: Secondary | ICD-10-CM

## 2022-10-28 NOTE — Progress Notes (Signed)
Subjective:    Patient ID: Becky Lara is a 67 y.o. female.  HPI    Interim history:   Becky Lara is a 67 year old right-handed woman with an underlying medical history of reflux disease, eczema, gout, hypertension, hyperlipidemia, hypokalemia, osteopenia, smoking, vitamin D deficiency, and obesity, who presents for follow-up consultation of Becky Lara Parkinsonism.  The patient is unaccompanied today.  I last saw Becky Lara on 04/22/2022, at which time Becky Lara reported feeling stable.  We talked about Becky Lara DaTscan results from 04/16/2022 which indicated decreased radiotracer activity in the posterior right putamen.  We mutually agreed to continue to monitor Becky Lara symptoms and examination and not start any new medications at the time.  Today, 10/28/2022: Becky Lara reports feeling stable, no new symptoms, tremor is intermittent and does not bother Becky Lara, Becky Lara tries to stay active and goes to the [facility] 5 or 6 times a week.  Becky Lara tries to hydrate well.  No new cognitive issues.  Becky Lara would like to continue to monitor Becky Lara symptoms and not utilize any medication for parkinsonism.  The patient's allergies, current medications, family history, past medical history, past social history, past surgical history and problem list were reviewed and updated as appropriate.    Previously:    03/24/22: (Becky Lara) reports a several month history of intermittent left hand and arm tremors.  Becky Lara reports that sometimes he feels the tremor inside but it is not always visible, sometimes Becky Lara notices the tremor affecting Becky Lara entire left arm when Becky Lara tries to rest in bed at night.  Becky Lara has not fallen but Becky Lara fell out of bed about 2 months ago.  Becky Lara hit Becky Lara head against a chair and sustained a black eye but did not get checked out for this.  Becky Lara has not noticed any changes with Becky Lara balance, no family history of tremors or Parkinson's disease.  Daughter has noticed a change in Becky Lara posture, Becky Lara seems to be more stooped.  Patient tries to exercise regularly and  goes to the gym about 4 times a week.  Becky Lara drinks quite a bit of caffeine in the form of soda, several cups per day and has been drinking quite a bit of alcohol in the form of wine, about 1 bottle per day on average.  Becky Lara reports that Becky Lara started drinking alcohol regularly after Becky Lara retired in November 2022.  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  Becky Lara lives with Becky Lara husband.  Becky Lara also smokes marijuana.  Becky Lara has 2 children, son lives in [place] and daughter lives in [place].  Becky Lara is a non-smoker.  Becky Lara denies any changes in Becky Lara speech or handwriting.  Daughter has not noticed much in the way of change in Becky Lara facial expression.  About 2 months ago Becky Lara reports that Becky Lara yelled out in Becky Lara sleep, Becky Lara husband reported this to Becky Lara, Becky Lara does not recall dreaming at the time.   I reviewed your office note from 03/09/2022.  Becky Lara reported a 4 to 53-monthhistory of hand tremors at the time.  Becky Lara had blood work through your office at the time and I reviewed the results: Vitamin D was 51.4, CMP showed glucose of 95, BUN 9, creatinine 0.86, AST 16, ALT 11, alk phos 75, CBC with differential and platelets normal, TSH 1.6, total T48.3, within range.  Becky Lara had a brain MRI without contrast on 12/19/2014 with indication of acute kidney injury, blurred vision.  I reviewed the results: Impression: Chronic microvascular ischemic changes.  No acute abnormality. Becky Lara had CT without contrast on 12/19/2014  with indication of bilateral blurry vision and headaches for 1 month, hypertension.  I reviewed the results: Impression: Nonspecific diffuse white matter disease.  No acute intracranial hemorrhage or mass effect.    Becky Lara Past Medical History Is Significant For: Past Medical History:  Diagnosis Date   AKI (acute kidney injury) ([facility]) 12/19/2014   Eczema    Esophageal reflux    Gastroesophageal reflux disease 03/09/2022   GERD (gastroesophageal reflux disease)    Gout    HTN (hypertension) 12/19/2014   Hyperlipidemia    Hypertension     Hypokalemia 12/19/2014   Osteopenia    Thoracic degenerative disc disease 12/08/2017   Per XR   Tobacco abuse    pt denies smoking cigarettes currently or as history, marijuana use only    Becky Lara Past Surgical History Is Significant For: Past Surgical History:  Procedure Laterality Date   COLONOSCOPY     2017   FOOT SURGERY     POLYPECTOMY     2017 TA x 1   TUBAL LIGATION      Becky Lara Family History Is Significant For: Family History  Problem Relation Age of Onset   Diabetes Mother    Heart attack Mother    Heart disease Mother 52       MI   Hyperlipidemia Mother    Heart disease Father 70       MI   Hypertension Brother    Alzheimer's disease Maternal Grandmother    Hypertension Daughter    Hypertension Son    Colon cancer Neg Hx    Colon polyps Neg Hx    Esophageal cancer Neg Hx    Rectal cancer Neg Hx    Stomach cancer Neg Hx    Breast cancer Neg Hx    Tremor Neg Hx    Parkinson's disease Neg Hx     Becky Lara Social History Is Significant For: Social History   Socioeconomic History   Marital status: Single    Spouse name: Not on file   Number of children: Not on file   Years of education: Not on file   Highest education level: Not on file  Occupational History   Not on file  Tobacco Use   Smoking status: Former    Packs/day: 0.05    Types: Cigarettes    Quit date: 01/14/2016    Years since quitting: 6.7   Smokeless tobacco: Never   Tobacco comments:    Smokes Marijuana -since age 55  Substance and Sexual Activity   Alcohol use: Not Currently    Alcohol/week: 6.0 standard drinks of alcohol    Types: 6 Glasses of wine per week    Comment: stopped alcohol   Drug use: Not Currently    Types: Marijuana    Comment: marijuana---does not smoke cigarettes   Sexual activity: Yes    Partners: Male    Comment: 2 partners   Other Topics Concern   Not on file  Social History Narrative   Not on file   Social Determinants of Health   Financial Resource Strain:  Not on file  Food Insecurity: Not on file  Transportation Needs: Not on file  Physical Activity: Not on file  Stress: Not on file  Social Connections: Not on file    Becky Lara Allergies Are:  No Known Allergies:   Becky Lara Current Medications Are:  Outpatient Encounter Medications as of 10/28/2022  Medication Sig   allopurinol (ZYLOPRIM) 100 MG tablet Take 1 tablet (100 mg total) by mouth daily.  amLODipine (NORVASC) 10 MG tablet Take 1 tablet (10 mg total) by mouth daily.   Ascorbic Acid (VITAMIN C) 500 MG CAPS Take by mouth.   aspirin EC 81 MG EC tablet Take 1 tablet (81 mg total) by mouth daily.   atorvastatin (LIPITOR) 10 MG tablet Take 1 tablet (10 mg total) by mouth daily.   Cholecalciferol (VITAMIN D3 PO) Take 2,000 Units by mouth.   clobetasol cream (TEMOVATE) 0.05 % Apply topically 2 (two) times daily.   cyanocobalamin 1000 MCG tablet Take 1,000 mcg by mouth daily.   esomeprazole (NEXIUM) 40 MG capsule Take 1 capsule (40 mg total) by mouth daily.   Glucosamine-Chondroitin (GLUCOSAMINE CHONDR COMPLEX PO) Take by mouth.   multivitamin-iron-minerals-folic acid (CENTRUM) chewable tablet Chew 1 tablet by mouth daily.   Turmeric 500 MG CAPS Take by mouth.   No facility-administered encounter medications on file as of 10/28/2022.  :  Review of Systems:  Out of a complete 14 point review of systems, all are reviewed and negative with the exception of these symptoms as listed below:  Review of Systems  Neurological:        Follow up for Parkinsonism.  Stable.      Objective:  Neurological Exam  Physical Exam Physical Examination:   Vitals:   10/28/22 0942  BP: 139/88  Pulse: 81    General Examination: The patient is a very pleasant 67 y.o. female in no acute distress. Becky Lara appears well-developed and well-nourished and well groomed.   HEENT: Normocephalic, atraumatic, pupils are equal, round and reactive to light, small pupils noted, mild bilateral cataracts.  Hearing grossly  intact, face is symmetric with minimal facial masking and mild nuchal rigidity is noted, stable, no lip, neck or jaw tremor.  Extraocular tracking shows mild saccadic breakdown with vertical gaze.  No nystagmus.  No carotid bruits.  Oropharynx exam reveals: Mild mouth dryness, adequate dental hygiene.  Tongue protrudes centrally and palate elevates symmetrically.     Chest: Clear to auscultation without wheezing, rhonchi or crackles noted.   Heart: S1+S2+0, regular and normal without murmurs, rubs or gallops noted.    Abdomen: Soft, non-tender and non-distended.   Extremities: There is no pitting edema in the distal lower extremities bilaterally.    Skin: Warm and dry without trophic changes noted.    Musculoskeletal: exam reveals no obvious joint deformities.    Neurologically:  Mental status: The patient is awake, alert and oriented in all 4 spheres. Becky Lara immediate and remote memory, attention, language skills and fund of knowledge are appropriate. There is no evidence of aphasia, agnosia, apraxia or anomia. Speech is clear with normal prosody and enunciation. Thought process is linear. Mood is normal and affect is normal.  Cranial nerves II - XII are as described above under HEENT exam.    (On 03/24/2022: On Archimedes spiral drawing Becky Lara has mild trembling with the left hand, mild insecurity with the right hand, handwriting with the right hand is legible, not tremulous, not micrographic.)   Motor exam: Normal bulk, strength and tone is noted, with the exception of slight increase in tone in the left wrist and forearm. Mild cogwheeling noted in the RUE.     Becky Lara has no significant resting tremor, mild intermittent left hand postural tremor, no action tremor, no intention tremor.  Fine motor skills are fairly well-preserved on both sides with the exception of slight slowness with the hand movements and finger taps and slight decrease in foot taps on the left, all stable.  Cerebellar testing: No  dysmetria or intention tremor. There is no truncal or gait ataxia.  No difficulty with finger-to-nose.     Sensory exam: intact to light touch in the upper and lower extremities.   Gait, station and balance: Becky Lara stands without difficulty, posture is slightly stooped for age, Becky Lara walks with fairly good stride length and pace but slightly decreased arm swing on the left more than right.  Becky Lara turns without issues. Becky Lara denies any lightheadedness upon standing.   Assessment and Plan:    In summary, FRANCES AMBROSINO is a very pleasant 67 year old female with an underlying medical history of reflux disease, eczema, gout, hypertension, hyperlipidemia, hypokalemia, osteopenia, smoking, vitamin D deficiency, and obesity, who presents for follow-up consultation of Becky Lara parkinsonism with left upper extremity tremor noted for the past nearly 1 year and exam findings of mild parkinsonism with slight left-sided lateralization. Becky Lara DaTscan on 04/16/2022 was supportive of an underlying parkinsonian syndrome with asymmetric findings and decreased radiotracer uptake in the posterior right putamen.  We talked about the possibility of utilizing symptomatic treatment options again today and mutually agreed to continue with watchful waiting and monitoring Becky Lara symptoms and exam.  We talked about the importance of maintaining a healthy lifestyle.  Becky Lara is exercising on a nearly daily basis at the [facility]. Becky Lara is advised to follow-up routinely in 6 months in this clinic, sooner if needed, to see one of our nurse practitioners.  I answered all Becky Lara questions today and the patient was in agreement.   I spent 30 minutes in total face-to-face time and in reviewing records during pre-charting, more than 50% of which was spent in counseling and coordination of care, reviewing test results, reviewing medications and treatment regimen and/or in discussing or reviewing the diagnosis of PD, the prognosis and treatment options. Pertinent laboratory  and imaging test results that were available during this visit with the patient were reviewed by me and considered in my medical decision making (see chart for details).

## 2022-11-02 ENCOUNTER — Ambulatory Visit (INDEPENDENT_AMBULATORY_CARE_PROVIDER_SITE_OTHER): Payer: Medicare Other | Admitting: Family Medicine

## 2022-11-02 ENCOUNTER — Encounter: Payer: Self-pay | Admitting: Family Medicine

## 2022-11-02 ENCOUNTER — Ambulatory Visit (INDEPENDENT_AMBULATORY_CARE_PROVIDER_SITE_OTHER): Payer: Medicare Other

## 2022-11-02 VITALS — BP 128/82 | HR 85 | Temp 97.6°F | Ht 65.5 in | Wt 220.0 lb

## 2022-11-02 DIAGNOSIS — I10 Essential (primary) hypertension: Secondary | ICD-10-CM | POA: Diagnosis not present

## 2022-11-02 DIAGNOSIS — E782 Mixed hyperlipidemia: Secondary | ICD-10-CM | POA: Diagnosis not present

## 2022-11-02 DIAGNOSIS — Z0000 Encounter for general adult medical examination without abnormal findings: Secondary | ICD-10-CM | POA: Diagnosis not present

## 2022-11-02 DIAGNOSIS — M1A00X0 Idiopathic chronic gout, unspecified site, without tophus (tophi): Secondary | ICD-10-CM | POA: Diagnosis not present

## 2022-11-02 DIAGNOSIS — J22 Unspecified acute lower respiratory infection: Secondary | ICD-10-CM | POA: Diagnosis not present

## 2022-11-02 MED ORDER — BENZONATATE 200 MG PO CAPS: 200.0000 mg | ORAL_CAPSULE | Freq: Two times a day (BID) | ORAL | 0 refills | Status: DC | PRN

## 2022-11-02 MED ORDER — AZITHROMYCIN 250 MG PO TABS: ORAL_TABLET | ORAL | 0 refills | Status: AC

## 2022-11-02 NOTE — Progress Notes (Signed)
Subjective:   Becky Lara is a 67 y.o. female who presents for an Initial Medicare Annual Wellness Visit.  Review of Systems     Cardiac Risk Factors include: advanced age (>25mn, >>30women);hypertension;dyslipidemia;obesity (BMI >30kg/m2);family history of premature cardiovascular disease     Objective:    Today's Vitals   11/02/22 0951  BP: 128/82  Pulse: 85  Temp: 97.6 F (36.4 C)  SpO2: 98%  Weight: 220 lb (99.8 kg)  Height: 5' 5.5" (1.664 m)  PainSc: 0-No pain   Body mass index is 36.05 kg/m.     11/02/2022   10:06 AM 02/12/2020   11:25 AM 01/26/2016    8:48 AM 12/18/2014   10:15 PM  Advanced Directives  Does Patient Have a Medical Advance Directive? Yes No No No  Type of [place]     Does patient want to make changes to medical advance directive? No - Patient declined     Copy of [place] Chart? Yes - validated most recent copy scanned in chart (See row information)     Would patient like information on creating a medical advance directive?  No - Patient declined No - patient declined information     Current Medications (verified) Outpatient Encounter Medications as of 11/02/2022  Medication Sig   allopurinol (ZYLOPRIM) 100 MG tablet Take 1 tablet (100 mg total) by mouth daily.   amLODipine (NORVASC) 10 MG tablet Take 1 tablet (10 mg total) by mouth daily.   Ascorbic Acid (VITAMIN C) 500 MG CAPS Take by mouth.   aspirin EC 81 MG EC tablet Take 1 tablet (81 mg total) by mouth daily.   atorvastatin (LIPITOR) 10 MG tablet Take 1 tablet (10 mg total) by mouth daily.   Cholecalciferol (VITAMIN D3 PO) Take 2,000 Units by mouth.   clobetasol cream (TEMOVATE) 0.05 % Apply topically 2 (two) times daily.   cyanocobalamin 1000 MCG tablet Take 1,000 mcg by mouth daily.   esomeprazole (NEXIUM) 40 MG capsule Take 1 capsule (40 mg total) by mouth daily.   Glucosamine-Chondroitin (GLUCOSAMINE CHONDR  COMPLEX PO) Take by mouth.   multivitamin-iron-minerals-folic acid (CENTRUM) chewable tablet Chew 1 tablet by mouth daily.   Turmeric 500 MG CAPS Take by mouth.   No facility-administered encounter medications on file as of 11/02/2022.    Allergies (verified) Patient has no known allergies.   History: Past Medical History:  Diagnosis Date   [place]   Eczema    Esophageal reflux    Gastroesophageal reflux disease 03/09/2022   GERD (gastroesophageal reflux disease)    Gout    HTN (hypertension) 12/19/2014   Hyperlipidemia    Hypertension    Hypokalemia 12/19/2014   Osteopenia    Thoracic degenerative disc disease 12/08/2017   Per XR   Tobacco abuse    pt denies smoking cigarettes currently or as history, marijuana use only   Past Surgical History:  Procedure Laterality Date   COLONOSCOPY     2017   FOOT SURGERY     POLYPECTOMY     2017 TA x 1   TUBAL LIGATION     Family History  Problem Relation Age of Onset   Diabetes Mother    Heart attack Mother    Heart disease Mother 673      MI   Hyperlipidemia Mother    Heart disease Father 732      MI  Hypertension Brother    Alzheimer's disease Maternal Grandmother    Hypertension Daughter    Hypertension Son    Colon cancer Neg Hx    Colon polyps Neg Hx    Esophageal cancer Neg Hx    Rectal cancer Neg Hx    Stomach cancer Neg Hx    Breast cancer Neg Hx    Tremor Neg Hx    Parkinson's disease Neg Hx    Social History   Socioeconomic History   Marital status: Single    Spouse name: Not on file   Number of children: Not on file   Years of education: Not on file   Highest education level: Not on file  Occupational History   Not on file  Tobacco Use   Smoking status: Former    Packs/day: 0.05    Types: Cigarettes    Quit date: 01/14/2016    Years since quitting: 6.8   Smokeless tobacco: Never   Tobacco comments:    Smokes Marijuana -since age 19  Substance and Sexual  Activity   Alcohol use: Not Currently    Alcohol/week: 6.0 standard drinks of alcohol    Types: 6 Glasses of wine per week    Comment: stopped alcohol   Drug use: Not Currently    Types: Marijuana    Comment: marijuana---does not smoke cigarettes   Sexual activity: Yes    Partners: Male    Comment: 2 partners   Other Topics Concern   Not on file  Social History Narrative   Not on file   Social Determinants of Health   Financial Resource Strain: Low Risk  (11/02/2022)   Overall Financial Resource Strain (CARDIA)    Difficulty of Paying Living Expenses: Not hard at all  Food Insecurity: No Food Insecurity (11/02/2022)   Hunger Vital Sign    Worried About Running Out of Food in the Last Year: Never true    Ran Out of Food in the Last Year: Never true  Transportation Needs: No Transportation Needs (11/02/2022)   PRAPARE - Hydrologist (Medical): No    Lack of Transportation (Non-Medical): No  Physical Activity: Sufficiently Active (11/02/2022)   Exercise Vital Sign    Days of Exercise per Week: 4 days    Minutes of Exercise per Session: 70 min  Stress: No Stress Concern Present (11/02/2022)   [place]    Feeling of Stress : Not at all  Social Connections: [place] (11/02/2022)   Social Connection and Isolation Panel [NHANES]    Frequency of Communication with Friends and Family: More than three times a week    Frequency of Social Gatherings with Friends and Family: More than three times a week    Attends Religious Services: More than 4 times per year    Active Member of [employer] or Organizations: Yes    Attends Music therapist: More than 4 times per year    Marital Status: Married    Tobacco Counseling Counseling given: Not Answered Tobacco comments: Smokes Marijuana -since age 71   Clinical Intake:  Pre-visit preparation completed: Yes  Pain : No/denies  pain Pain Score: 0-No pain     BMI - recorded: 36.05 Nutritional Status: BMI > 30  Obese Nutritional Risks: None Diabetes: No  How often do you need to have someone help you when you read instructions, pamphlets, or other written materials from your doctor or pharmacy?: 1 - Never  What is the last grade level you completed in school?: HSG  Diabetic? no  Interpreter Needed?: No  Information entered by :: Lisette Abu, LPN.   Activities of Daily Living    11/02/2022   10:06 AM  In your present state of health, do you have any difficulty performing the following activities:  Hearing? 0  Vision? 0  Difficulty concentrating or making decisions? 0  Walking or climbing stairs? 0  Dressing or bathing? 0  Doing errands, shopping? 0  Preparing Food and eating ? N  Using the Toilet? N  In the past six months, have you accidently leaked urine? N  Do you have problems with loss of bowel control? N  Managing your Medications? N  Managing your Finances? N  Housekeeping or managing your Housekeeping? N    Patient Care Team: Girtha Rm, NP-C as PCP - General (Family Medicine)  Indicate any recent Medical Services you may have received from other than [facility] providers in the past year (date may be approximate).     Assessment:   This is a routine wellness examination for [place].  Hearing/Vision screen Hearing Screening - Comments:: Denies hearing difficulties   Vision Screening - Comments:: Wears rx glasses - not up to date with routine eye exams.   Dietary issues and exercise activities discussed: Current Exercise Habits: Structured exercise class;Home exercise routine ([employer]), Type of exercise: walking;treadmill;stretching;strength training/weights;exercise ball;calisthenics, Time (Minutes): 60 (60+), Frequency (Times/Week): 4, Weekly Exercise (Minutes/Week): 240, Intensity: Moderate, Exercise limited by: None identified   Goals Addressed             This Visit's  Progress    To maintain my current health status by continuing to eat healthy, stay physically active and socially active.        Depression Screen    11/02/2022    9:46 AM 10/05/2022    8:10 AM 03/09/2022    9:12 AM 02/13/2021    9:06 AM 07/17/2020    8:27 AM 07/09/2019   10:32 AM 03/14/2019    8:29 AM  PHQ 2/9 Scores  PHQ - 2 Score 0 0 0 0 0 0 0    Fall Risk    11/02/2022    9:46 AM 10/05/2022    8:10 AM 03/09/2022    9:10 AM 02/13/2021    9:06 AM 07/17/2020    8:27 AM  Fall Risk   Falls in the past year? 0 0 1 0 0  Number falls in past yr: 0 0 0 0 0  Injury with Fall? 0 0 1 0 0  Risk for fall due to : No Fall Risks No Fall Risks No Fall Risks No Fall Risks   Follow up Falls prevention discussed Falls evaluation completed Falls evaluation completed;Education provided Falls evaluation completed     FALL RISK PREVENTION PERTAINING TO THE HOME:  Any stairs in or around the home? No  If so, are there any without handrails? No  Home free of loose throw rugs in walkways, pet beds, electrical cords, etc? Yes  Adequate lighting in your home to reduce risk of falls? Yes   ASSISTIVE DEVICES UTILIZED TO PREVENT FALLS:  Life alert? No  Use of a cane, walker or w/c? No  Grab bars in the bathroom? No  Shower chair or bench in shower? No  Elevated toilet seat or a handicapped toilet? No   TIMED UP AND GO:  Was the test performed? Yes .  Length of time to ambulate 10 feet:  8 sec.   Gait steady and fast without use of assistive device  Cognitive Function:        11/02/2022   10:09 AM  6CIT Screen  What Year? 0 points  What month? 0 points  What time? 0 points  Count back from 20 0 points  Months in reverse 0 points  Repeat phrase 0 points  Total Score 0 points    Immunizations Immunization History  Administered Date(s) Administered   COVID-19, mRNA, vaccine(Comirnaty)12 years and older 08/11/2022   Fluad Quad(high Dose 65+) 07/17/2020, 08/06/2021, 07/02/2022   Hepatitis  A, Adult 07/04/2018, 02/20/2019   Influenza,inj,Quad PF,6+ Mos 12/02/2015, 08/18/2017, 07/04/2018, 07/09/2019   PFIZER(Purple Top)SARS-COV-2 Vaccination 12/27/2019, 01/23/2020, 08/20/2020   Pfizer Covid-19 Vaccine Bivalent Booster 53yr & up 08/06/2021   Pneumococcal Conjugate-13 07/17/2020   Pneumococcal Polysaccharide-23 12/20/2014   Tdap 12/02/2015   Zoster Recombinat (Shingrix) 07/05/2018, 02/20/2019    TDAP status: Up to date  Flu Vaccine status: Up to date  Pneumococcal vaccine status: Up to date  Covid-19 vaccine status: Completed vaccines  Qualifies for Shingles Vaccine? Yes   Zostavax completed No   Shingrix Completed?: Yes  Screening Tests Health Maintenance  Topic Date Due   COVID-19 Vaccine (6 - 2023-24 season) 11/18/2022 (Originally 10/06/2022)   Medicare Annual Wellness (AWV)  11/03/2023   MAMMOGRAM  07/14/2024   Pneumonia Vaccine 68 Years old (3 - PPSV23 or PCV20) 07/17/2025   DTaP/Tdap/Td (2 - Td or Tdap) 12/01/2025   COLONOSCOPY (Pts 45-453yrInsurance coverage will need to be confirmed)  05/21/2026   INFLUENZA VACCINE  Completed   DEXA SCAN  Completed   Hepatitis C Screening  Completed   Zoster Vaccines- Shingrix  Completed   HPV VACCINES  Aged Out    Health Maintenance  There are no preventive care reminders to display for this patient.   Colorectal cancer screening: Type of screening: Colonoscopy. Completed 05/21/2021. Repeat every 5 years  Mammogram status: Completed 07/14/2022. Repeat every year  Bone Density status: Completed 08/20/2021. Results reflect: Bone density results: OSTEOPENIA. Repeat every 2-3 years.  Lung Cancer Screening: (Low Dose CT Chest recommended if Age 68-80ears, 30 pack-year currently smoking OR have quit w/in 15years.) does not qualify.   Lung Cancer Screening Referral: no  Additional Screening:  Hepatitis C Screening: does qualify; Completed 07/09/2019  Vision Screening: Recommended annual ophthalmology exams for  early detection of glaucoma and other disorders of the eye. Is the patient up to date with their annual eye exam?  No  Who is the provider or what is the name of the office in which the patient attends annual eye exams? Patient refused If pt is not established with a provider, would they like to be referred to a provider to establish care? No .   Dental Screening: Recommended annual dental exams for proper oral hygiene  Community Resource Referral / Chronic Care Management: CRR required this visit?  No   CCM required this visit?  No      Plan:     I have personally reviewed and noted the following in the patient's chart:   Medical and social history Use of alcohol, tobacco or illicit drugs  Current medications and supplements including opioid prescriptions. Patient is not currently taking opioid prescriptions. Functional ability and status Nutritional status Physical activity Advanced directives List of other physicians Hospitalizations, surgeries, and ER visits in previous 12 months Vitals Screenings to include cognitive, depression, and falls Referrals and appointments  In addition, I have reviewed  and discussed with patient certain preventive protocols, quality metrics, and best practice recommendations. A written personalized care plan for preventive services as well as general preventive health recommendations were provided to patient.     Sheral Flow, LPN   [id number]/[id number]   Nurse Notes:  Normal cognitive status assessed by direct observation by this Nurse Health Advisor. No abnormalities found.

## 2022-11-02 NOTE — Progress Notes (Signed)
Subjective:     Patient ID: Becky Lara, female    DOB: [birth year], 67 y.o.   MRN: [id number]  Chief Complaint  Patient presents with   Follow-up    4 week f/u, also has had the flu since last Tuesday. Still experiencing head pressure and congestion/cough    HPI Patient is in today for follow up on HTN, HL and gout. She had been off of her medications for months prior to our re-establish visit 4 weeks ago.  States she has been taking her medications regularly.   No gout flare up in months/years.   States she got sick one week ago. Felt like the flu. She did not get tested and was not seen anywhere.  Reports worsening productive cough.   Taking OTC medications.   Denies fever, chills, dizziness, chest pain, palpitations, shortness of breath, abdominal pain, N/V/D, urinary symptoms, LE edema.      There are no preventive care reminders to display for this patient.   Past Medical History:  Diagnosis Date   AKI (acute kidney injury) ([place]) 12/19/2014   Eczema    Esophageal reflux    Gastroesophageal reflux disease 03/09/2022   GERD (gastroesophageal reflux disease)    Gout    HTN (hypertension) 12/19/2014   Hyperlipidemia    Hypertension    Hypokalemia 12/19/2014   Osteopenia    Thoracic degenerative disc disease 12/08/2017   Per XR   Tobacco abuse    pt denies smoking cigarettes currently or as history, marijuana use only    Past Surgical History:  Procedure Laterality Date   COLONOSCOPY     2017   FOOT SURGERY     POLYPECTOMY     2017 TA x 1   TUBAL LIGATION      Family History  Problem Relation Age of Onset   Diabetes Mother    Heart attack Mother    Heart disease Mother 90       MI   Hyperlipidemia Mother    Heart disease Father 43       MI   Hypertension Brother    Alzheimer's disease Maternal Grandmother    Hypertension Daughter    Hypertension Son    Colon cancer Neg Hx    Colon polyps Neg Hx    Esophageal cancer Neg Hx    Rectal  cancer Neg Hx    Stomach cancer Neg Hx    Breast cancer Neg Hx    Tremor Neg Hx    Parkinson's disease Neg Hx     Social History   Socioeconomic History   Marital status: Single    Spouse name: Not on file   Number of children: Not on file   Years of education: Not on file   Highest education level: Not on file  Occupational History   Not on file  Tobacco Use   Smoking status: Former    Packs/day: 0.05    Types: Cigarettes    Quit date: 01/14/2016    Years since quitting: 6.8   Smokeless tobacco: Never   Tobacco comments:    Smokes Marijuana -since age 28  Substance and Sexual Activity   Alcohol use: Not Currently    Alcohol/week: 6.0 standard drinks of alcohol    Types: 6 Glasses of wine per week    Comment: stopped alcohol   Drug use: Not Currently    Types: Marijuana    Comment: marijuana---does not smoke cigarettes   Sexual activity: Yes  Partners: Male    Comment: 2 partners   Other Topics Concern   Not on file  Social History Narrative   Not on file   Social Determinants of Health   Financial Resource Strain: Low Risk  (11/02/2022)   Overall Financial Resource Strain (CARDIA)    Difficulty of Paying Living Expenses: Not hard at all  Food Insecurity: No Food Insecurity (11/02/2022)   Hunger Vital Sign    Worried About Running Out of Food in the Last Year: Never true    Show Low in the Last Year: Never true  Transportation Needs: No Transportation Needs (11/02/2022)   PRAPARE - Hydrologist (Medical): No    Lack of Transportation (Non-Medical): No  Physical Activity: Sufficiently Active (11/02/2022)   Exercise Vital Sign    Days of Exercise per Week: 4 days    Minutes of Exercise per Session: 70 min  Stress: No Stress Concern Present (11/02/2022)   [place]    Feeling of Stress : Not at all  Social Connections: [place] (11/02/2022)   Social  Connection and Isolation Panel [NHANES]    Frequency of Communication with Friends and Family: More than three times a week    Frequency of Social Gatherings with Friends and Family: More than three times a week    Attends Religious Services: More than 4 times per year    Active Member of [employer] or Organizations: Yes    Attends Music therapist: More than 4 times per year    Marital Status: Married  Human resources officer Violence: Not At Risk (11/02/2022)   Humiliation, Afraid, Rape, and Kick questionnaire    Fear of Current or Ex-Partner: No    Emotionally Abused: No    Physically Abused: No    Sexually Abused: No    Outpatient Medications Prior to Visit  Medication Sig Dispense Refill   allopurinol (ZYLOPRIM) 100 MG tablet Take 1 tablet (100 mg total) by mouth daily. 90 tablet 1   amLODipine (NORVASC) 10 MG tablet Take 1 tablet (10 mg total) by mouth daily. 90 tablet 1   Ascorbic Acid (VITAMIN C) 500 MG CAPS Take by mouth.     aspirin EC 81 MG EC tablet Take 1 tablet (81 mg total) by mouth daily.     atorvastatin (LIPITOR) 10 MG tablet Take 1 tablet (10 mg total) by mouth daily. 90 tablet 2   Cholecalciferol (VITAMIN D3 PO) Take 2,000 Units by mouth.     clobetasol cream (TEMOVATE) 0.05 % Apply topically 2 (two) times daily. 30 g 1   cyanocobalamin 1000 MCG tablet Take 1,000 mcg by mouth daily.     esomeprazole (NEXIUM) 40 MG capsule Take 1 capsule (40 mg total) by mouth daily. 90 capsule 2   Glucosamine-Chondroitin (GLUCOSAMINE CHONDR COMPLEX PO) Take by mouth.     multivitamin-iron-minerals-folic acid (CENTRUM) chewable tablet Chew 1 tablet by mouth daily.     Turmeric 500 MG CAPS Take by mouth.     No facility-administered medications prior to visit.    No Known Allergies  ROS     Objective:    Physical Exam Constitutional:      General: She is not in acute distress.    Appearance: She is not ill-appearing.  HENT:     Mouth/Throat:     Mouth: Mucous membranes  are moist.  Eyes:     Extraocular Movements: Extraocular movements intact.  Conjunctiva/sclera: Conjunctivae normal.     Pupils: Pupils are equal, round, and reactive to light.  Cardiovascular:     Rate and Rhythm: Normal rate and regular rhythm.  Pulmonary:     Effort: Pulmonary effort is normal.     Breath sounds: Normal breath sounds.  Skin:    General: Skin is warm and dry.  Neurological:     General: No focal deficit present.     Mental Status: She is alert and oriented to person, place, and time.  Psychiatric:        Mood and Affect: Mood normal.        Behavior: Behavior normal.        Thought Content: Thought content normal.     BP 128/82 (BP Location: Left Arm, Patient Position: Sitting, Cuff Size: Large)   Pulse 85   Temp 97.6 F (36.4 C) (Temporal)   Ht 5' 5.5" (1.664 m)   Wt 220 lb (99.8 kg)   LMP 04/26/2008   SpO2 98%   BMI 36.05 kg/m  Wt Readings from Last 3 Encounters:  11/02/22 220 lb (99.8 kg)  11/02/22 220 lb (99.8 kg)  10/28/22 227 lb 9.6 oz (103.2 kg)       Assessment & Plan:   Problem List Items Addressed This Visit       Cardiovascular and Mediastinum   Primary hypertension     Other   Chronic gout   Mixed hyperlipidemia   Other Visit Diagnoses     Lower resp. tract infection    -  Primary   Relevant Medications   azithromycin (ZITHROMAX) 250 MG tablet   benzonatate (TESSALON) 200 MG capsule      HTN- BP responded well to restarting amlodipine 10 mg. Continue medications.  Started back on allopurinol. Uric acid was elevated in the 7 range at her previous visit but no gout flares. Continue allopurinol.  Continue statin therapy. LDL was elevated 4 weeks ago.  Worsening lower respiratory infection. Azithromycin and Tessalon prescribed.  Follow up if worsening or not improving in one week.  Follow up in 6 months for fasting med check.   I am having Megan Salon. [place] start on azithromycin and benzonatate. I am also having her  maintain her aspirin EC, Cholecalciferol (VITAMIN D3 PO), Vitamin C, multivitamin-iron-minerals-folic acid, cyanocobalamin, Turmeric, Glucosamine-Chondroitin (GLUCOSAMINE CHONDR COMPLEX PO), esomeprazole, clobetasol cream, amLODipine, atorvastatin, and allopurinol.  Meds ordered this encounter  Medications   azithromycin (ZITHROMAX) 250 MG tablet    Sig: Take 2 tablets on day 1, then 1 tablet daily on days 2 through 5    Dispense:  6 tablet    Refill:  0    Order Specific Question:   Supervising Provider    Answer:   Pricilla Holm A [[id number]]   benzonatate (TESSALON) 200 MG capsule    Sig: Take 1 capsule (200 mg total) by mouth 2 (two) times daily as needed for cough.    Dispense:  20 capsule    Refill:  0    Order Specific Question:   Supervising Provider    Answer:   Pricilla Holm A [[id number]]

## 2022-11-02 NOTE — Patient Instructions (Signed)
Becky Lara , Thank you for taking time to come for your Medicare Wellness Visit. I appreciate your ongoing commitment to your health goals. Please review the following plan we discussed and let me know if I can assist you in the future.   These are the goals we discussed:  Goals      To maintain my current health status by continuing to eat healthy, stay physically active and socially active.        This is a list of the screening recommended for you and due dates:  Health Maintenance  Topic Date Due   Medicare Annual Wellness Visit  Never done   COVID-19 Vaccine (6 - 2023-24 season) 11/18/2022*   Mammogram  07/14/2024   Pneumonia Vaccine (3 - PPSV23 or PCV20) 07/17/2025   DTaP/Tdap/Td vaccine (2 - Td or Tdap) 12/01/2025   Colon Cancer Screening  05/21/2026   Flu Shot  Completed   DEXA scan (bone density measurement)  Completed   Hepatitis C Screening: USPSTF Recommendation to screen - Ages 18-79 yo.  Completed   Zoster (Shingles) Vaccine  Completed   HPV Vaccine  Aged Out  *Topic was postponed. The date shown is not the original due date.    Advanced directives: Yes  Conditions/risks identified: Yes  Next appointment: Follow up in one year for your annual wellness visit.   Preventive Care 45 Years and Older, Female Preventive care refers to lifestyle choices and visits with your health care provider that can promote health and wellness. What does preventive care include? A yearly physical exam. This is also called an annual well check. Dental exams once or twice a year. Routine eye exams. Ask your health care provider how often you should have your eyes checked. Personal lifestyle choices, including: Daily care of your teeth and gums. Regular physical activity. Eating a healthy diet. Avoiding tobacco and drug use. Limiting alcohol use. Practicing safe sex. Taking low-dose aspirin every day. Taking vitamin and mineral supplements as recommended by your health care  provider. What happens during an annual well check? The services and screenings done by your health care provider during your annual well check will depend on your age, overall health, lifestyle risk factors, and family history of disease. Counseling  Your health care provider may ask you questions about your: Alcohol use. Tobacco use. Drug use. Emotional well-being. Home and relationship well-being. Sexual activity. Eating habits. History of falls. Memory and ability to understand (cognition). Work and work Statistician. Reproductive health. Screening  You may have the following tests or measurements: Height, weight, and BMI. Blood pressure. Lipid and cholesterol levels. These may be checked every 5 years, or more frequently if you are over 33 years old. Skin check. Lung cancer screening. You may have this screening every year starting at age 52 if you have a 30-pack-year history of smoking and currently smoke or have quit within the past 15 years. Fecal occult blood test (FOBT) of the stool. You may have this test every year starting at age 8. Flexible sigmoidoscopy or colonoscopy. You may have a sigmoidoscopy every 5 years or a colonoscopy every 10 years starting at age 56. Hepatitis C blood test. Hepatitis B blood test. Sexually transmitted disease (STD) testing. Diabetes screening. This is done by checking your blood sugar (glucose) after you have not eaten for a while (fasting). You may have this done every 1-3 years. Bone density scan. This is done to screen for osteoporosis. You may have this done starting at age 35.  Mammogram. This may be done every 1-2 years. Talk to your health care provider about how often you should have regular mammograms. Talk with your health care provider about your test results, treatment options, and if necessary, the need for more tests. Vaccines  Your health care provider may recommend certain vaccines, such as: Influenza vaccine. This is  recommended every year. Tetanus, diphtheria, and acellular pertussis (Tdap, Td) vaccine. You may need a Td booster every 10 years. Zoster vaccine. You may need this after age 43. Pneumococcal 13-valent conjugate (PCV13) vaccine. One dose is recommended after age 47. Pneumococcal polysaccharide (PPSV23) vaccine. One dose is recommended after age 7. Talk to your health care provider about which screenings and vaccines you need and how often you need them. This information is not intended to replace advice given to you by your health care provider. Make sure you discuss any questions you have with your health care provider. Document Released: 11/07/2015 Document Revised: 06/30/2016 Document Reviewed: 08/12/2015 Elsevier Interactive Patient Education  2017 [place] Prevention in the Home Falls can cause injuries. They can happen to people of all ages. There are many things you can do to make your home safe and to help prevent falls. What can I do on the outside of my home? Regularly fix the edges of walkways and driveways and fix any cracks. Remove anything that might make you trip as you walk through a door, such as a raised step or threshold. Trim any bushes or trees on the path to your home. Use bright outdoor lighting. Clear any walking paths of anything that might make someone trip, such as rocks or tools. Regularly check to see if handrails are loose or broken. Make sure that both sides of any steps have handrails. Any raised decks and porches should have guardrails on the edges. Have any leaves, snow, or ice cleared regularly. Use sand or salt on walking paths during winter. Clean up any spills in your garage right away. This includes oil or grease spills. What can I do in the bathroom? Use night lights. Install grab bars by the toilet and in the tub and shower. Do not use towel bars as grab bars. Use non-skid mats or decals in the tub or shower. If you need to sit down in  the shower, use a plastic, non-slip stool. Keep the floor dry. Clean up any water that spills on the floor as soon as it happens. Remove soap buildup in the tub or shower regularly. Attach bath mats securely with double-sided non-slip rug tape. Do not have throw rugs and other things on the floor that can make you trip. What can I do in the bedroom? Use night lights. Make sure that you have a light by your bed that is easy to reach. Do not use any sheets or blankets that are too big for your bed. They should not hang down onto the floor. Have a firm chair that has side arms. You can use this for support while you get dressed. Do not have throw rugs and other things on the floor that can make you trip. What can I do in the kitchen? Clean up any spills right away. Avoid walking on wet floors. Keep items that you use a lot in easy-to-reach places. If you need to reach something above you, use a strong step stool that has a grab bar. Keep electrical cords out of the way. Do not use floor polish or wax that makes floors slippery. If  you must use wax, use non-skid floor wax. Do not have throw rugs and other things on the floor that can make you trip. What can I do with my stairs? Do not leave any items on the stairs. Make sure that there are handrails on both sides of the stairs and use them. Fix handrails that are broken or loose. Make sure that handrails are as long as the stairways. Check any carpeting to make sure that it is firmly attached to the stairs. Fix any carpet that is loose or worn. Avoid having throw rugs at the top or bottom of the stairs. If you do have throw rugs, attach them to the floor with carpet tape. Make sure that you have a light switch at the top of the stairs and the bottom of the stairs. If you do not have them, ask someone to add them for you. What else can I do to help prevent falls? Wear shoes that: Do not have high heels. Have rubber bottoms. Are comfortable  and fit you well. Are closed at the toe. Do not wear sandals. If you use a stepladder: Make sure that it is fully opened. Do not climb a closed stepladder. Make sure that both sides of the stepladder are locked into place. Ask someone to hold it for you, if possible. Clearly mark and make sure that you can see: Any grab bars or handrails. First and last steps. Where the edge of each step is. Use tools that help you move around (mobility aids) if they are needed. These include: Canes. Walkers. Scooters. Crutches. Turn on the lights when you go into a dark area. Replace any light bulbs as soon as they burn out. Set up your furniture so you have a clear path. Avoid moving your furniture around. If any of your floors are uneven, fix them. If there are any pets around you, be aware of where they are. Review your medicines with your doctor. Some medicines can make you feel dizzy. This can increase your chance of falling. Ask your doctor what other things that you can do to help prevent falls. This information is not intended to replace advice given to you by your health care provider. Make sure you discuss any questions you have with your health care provider. Document Released: 08/07/2009 Document Revised: 03/18/2016 Document Reviewed: 11/15/2014 Elsevier Interactive Patient Education  2017 [employer].

## 2022-11-02 NOTE — Patient Instructions (Addendum)
Take the antibiotic as prescribed.  Stay well-hydrated.  I recommend taking over-the-counter Mucinex for cough and congestion.  Take Tylenol as needed  I prescribed Tessalon Perles for cough.  You can take this along with over-the-counter cough medicine.  Follow-up if you are not back to baseline in the next week or 2.  The cough can linger but should improve.  Continue your medications. Your blood pressure looks much better.   I will see you back in 6 months for a fasting follow up or sooner if needed.

## 2023-05-03 ENCOUNTER — Ambulatory Visit (INDEPENDENT_AMBULATORY_CARE_PROVIDER_SITE_OTHER): Payer: Medicare Other | Admitting: Family Medicine

## 2023-05-03 ENCOUNTER — Encounter: Payer: Self-pay | Admitting: Family Medicine

## 2023-05-03 ENCOUNTER — Ambulatory Visit (INDEPENDENT_AMBULATORY_CARE_PROVIDER_SITE_OTHER): Payer: Medicare Other

## 2023-05-03 VITALS — BP 110/72 | HR 92 | Temp 97.3°F | Ht 65.5 in | Wt 221.0 lb

## 2023-05-03 DIAGNOSIS — R9431 Abnormal electrocardiogram [ECG] [EKG]: Secondary | ICD-10-CM | POA: Diagnosis not present

## 2023-05-03 DIAGNOSIS — M1A00X0 Idiopathic chronic gout, unspecified site, without tophus (tophi): Secondary | ICD-10-CM | POA: Diagnosis not present

## 2023-05-03 DIAGNOSIS — R079 Chest pain, unspecified: Secondary | ICD-10-CM

## 2023-05-03 DIAGNOSIS — I10 Essential (primary) hypertension: Secondary | ICD-10-CM

## 2023-05-03 DIAGNOSIS — E782 Mixed hyperlipidemia: Secondary | ICD-10-CM

## 2023-05-03 DIAGNOSIS — Z87891 Personal history of nicotine dependence: Secondary | ICD-10-CM | POA: Diagnosis not present

## 2023-05-03 DIAGNOSIS — R0609 Other forms of dyspnea: Secondary | ICD-10-CM

## 2023-05-03 DIAGNOSIS — R7989 Other specified abnormal findings of blood chemistry: Secondary | ICD-10-CM | POA: Diagnosis not present

## 2023-05-03 DIAGNOSIS — L309 Dermatitis, unspecified: Secondary | ICD-10-CM | POA: Diagnosis not present

## 2023-05-03 DIAGNOSIS — R0989 Other specified symptoms and signs involving the circulatory and respiratory systems: Secondary | ICD-10-CM | POA: Diagnosis not present

## 2023-05-03 DIAGNOSIS — M8580 Other specified disorders of bone density and structure, unspecified site: Secondary | ICD-10-CM | POA: Diagnosis not present

## 2023-05-03 DIAGNOSIS — R918 Other nonspecific abnormal finding of lung field: Secondary | ICD-10-CM | POA: Diagnosis not present

## 2023-05-03 DIAGNOSIS — E559 Vitamin D deficiency, unspecified: Secondary | ICD-10-CM | POA: Diagnosis not present

## 2023-05-03 LAB — LIPID PANEL
Cholesterol: 166 mg/dL (ref 0–200)
HDL: 58.3 mg/dL (ref >39.00)
LDL Cholesterol: 91 mg/dL (ref 0–99)
NonHDL: 107.46
Total CHOL/HDL Ratio: 3
Triglycerides: 84 mg/dL (ref 0.0–149.0)
VLDL: 16.8 mg/dL (ref 0.0–40.0)

## 2023-05-03 LAB — CBC WITH DIFFERENTIAL/PLATELET
Basophils Absolute: 0.1 10*3/uL (ref 0.0–0.1)
Basophils Relative: 0.6 % (ref 0.0–3.0)
Eosinophils Absolute: 0.4 10*3/uL (ref 0.0–0.7)
Eosinophils Relative: 4 % (ref 0.0–5.0)
HCT: 43 % (ref 36.0–46.0)
Hemoglobin: 14.1 g/dL (ref 12.0–15.0)
Lymphocytes Relative: 21 % (ref 12.0–46.0)
Lymphs Abs: 2 10*3/uL (ref 0.7–4.0)
MCHC: 32.7 g/dL (ref 30.0–36.0)
MCV: 85 fl (ref 78.0–100.0)
Monocytes Absolute: 0.6 10*3/uL (ref 0.1–1.0)
Monocytes Relative: 5.8 % (ref 3.0–12.0)
Neutro Abs: 6.6 10*3/uL (ref 1.4–7.7)
Neutrophils Relative %: 68.6 % (ref 43.0–77.0)
Platelets: 232 10*3/uL (ref 150.0–400.0)
RBC: 5.06 Mil/uL (ref 3.87–5.11)
RDW: 14 % (ref 11.5–15.5)
WBC: 9.6 10*3/uL (ref 4.0–10.5)

## 2023-05-03 LAB — COMPREHENSIVE METABOLIC PANEL
ALT: 13 U/L (ref 0–35)
AST: 16 U/L (ref 0–37)
Albumin: 4.6 g/dL (ref 3.5–5.2)
Alkaline Phosphatase: 111 U/L (ref 39–117)
BUN: 10 mg/dL (ref 6–23)
CO2: 26 mEq/L (ref 19–32)
Calcium: 10.4 mg/dL (ref 8.4–10.5)
Chloride: 106 mEq/L (ref 96–112)
Creatinine, Ser: 1.06 mg/dL (ref 0.40–1.20)
GFR: 54.02 mL/min — ABNORMAL LOW (ref >60.00)
Glucose, Bld: 114 mg/dL — ABNORMAL HIGH (ref 70–99)
Potassium: 3.8 mEq/L (ref 3.5–5.1)
Sodium: 140 mEq/L (ref 135–145)
Total Bilirubin: 0.9 mg/dL (ref 0.2–1.2)
Total Protein: 8.7 g/dL — ABNORMAL HIGH (ref 6.0–8.3)

## 2023-05-03 LAB — D-DIMER, QUANTITATIVE: D-Dimer, Quant: 0.6 mcg/mL FEU — ABNORMAL HIGH (ref <0.50)

## 2023-05-03 LAB — TSH: TSH: 2.62 u[IU]/mL (ref 0.35–5.50)

## 2023-05-03 LAB — BRAIN NATRIURETIC PEPTIDE: Pro B Natriuretic peptide (BNP): 35 pg/mL (ref 0.0–100.0)

## 2023-05-03 LAB — T4, FREE: Free T4: 0.93 ng/dL (ref 0.60–1.60)

## 2023-05-03 LAB — TROPONIN I (HIGH SENSITIVITY): High Sens Troponin I: 6 ng/L (ref 2–17)

## 2023-05-03 MED ORDER — ATORVASTATIN CALCIUM 10 MG PO TABS: 10.0000 mg | ORAL_TABLET | Freq: Every day | ORAL | 2 refills | Status: DC

## 2023-05-03 MED ORDER — CLOBETASOL PROPIONATE 0.05 % EX CREA: TOPICAL_CREAM | Freq: Two times a day (BID) | CUTANEOUS | 1 refills | Status: AC

## 2023-05-03 MED ORDER — ESOMEPRAZOLE MAGNESIUM 40 MG PO CPDR: 40.0000 mg | DELAYED_RELEASE_CAPSULE | Freq: Every day | ORAL | 2 refills | Status: DC

## 2023-05-03 MED ORDER — AMLODIPINE BESYLATE 10 MG PO TABS: 10.0000 mg | ORAL_TABLET | Freq: Every day | ORAL | 1 refills | Status: DC

## 2023-05-03 MED ORDER — ALLOPURINOL 100 MG PO TABS: 100.0000 mg | ORAL_TABLET | Freq: Every day | ORAL | 1 refills | Status: DC

## 2023-05-03 NOTE — Assessment & Plan Note (Signed)
Continue statin therapy. Follow up pending lipids.

## 2023-05-03 NOTE — Patient Instructions (Signed)
Please go downstairs for labs and a chest x-ray before you leave.  If your chest pain returns or if you have worsening shortness of breath, please call 911 or go to the emergency department for further evaluation.  I have placed a referral to cardiology and they will call you to schedule a visit.

## 2023-05-03 NOTE — Assessment & Plan Note (Signed)
Sinus rhythm with PVC, T wave abnormal in V1 which is new and flat T waves in V6.

## 2023-05-03 NOTE — Assessment & Plan Note (Signed)
Order labs including D-dimer, troponin, BNP and chest XR. Referral to cardiology. Follow up pending labs. Advised to call 911 or go to the ED if worsening.

## 2023-05-03 NOTE — Assessment & Plan Note (Signed)
Continue allopurinol. Only started this last week.

## 2023-05-03 NOTE — Assessment & Plan Note (Signed)
Last DEXA 07/2021. Counseling on getting adequate calcium 1,200 mg and taking vitamin D 1,000 IUs daily. Weight bearing exercise as well. Recheck DEXA in 07/2023.  

## 2023-05-03 NOTE — Progress Notes (Signed)
Please call her and let her know that one of her lab tests is borderline suggesting we need to rule out a blood clot in her lungs. I have ordered a STAT CTA at [facility]. Please ask scheduling coordinator or whomever is handling this today.

## 2023-05-03 NOTE — Progress Notes (Signed)
Subjective:     Patient ID: Becky Lara, female    DOB: 1955/11/23, 67 y.o.   MRN: [id number]  Chief Complaint  Patient presents with   Medical Management of Chronic Issues    6 month f/u, fasting    HPI  Discussed the use of AI scribe software for clinical note transcription with the patient, who gave verbal consent to proceed.  History of Present Illness           C/o chest pain and DOE x 4-5 months. Pain is described as pressure. Non radiating.  Last episode was 4 days ago. She has not been able to do her usual daily activities.  Former smoker.  Denies family hx of heart disease.   She has not been taking allopurinol for months. Started back last week.  Right middle finger swollen x 2 weeks.    Osteopenia- last DEXA 08/20/2021    There are no preventive care reminders to display for this patient.  Past Medical History:  Diagnosis Date   AKI (acute kidney injury) (HCC) 12/19/2014   Eczema    Esophageal reflux    Gastroesophageal reflux disease 03/09/2022   GERD (gastroesophageal reflux disease)    Gout    HTN (hypertension) 12/19/2014   Hyperlipidemia    Hypertension    Hypokalemia 12/19/2014   Osteopenia    Thoracic degenerative disc disease 12/08/2017   Per XR   Tobacco abuse    pt denies smoking cigarettes currently or as history, marijuana use only    Past Surgical History:  Procedure Laterality Date   COLONOSCOPY     2017   FOOT SURGERY     POLYPECTOMY     2017 TA x 1   TUBAL LIGATION      Family History  Problem Relation Age of Onset   Diabetes Mother    Heart attack Mother    Heart disease Mother 97       MI   Hyperlipidemia Mother    Heart disease Father 60       MI   Hypertension Brother    Alzheimer's disease Maternal Grandmother    Hypertension Daughter    Hypertension Son    Colon cancer Neg Hx    Colon polyps Neg Hx    Esophageal cancer Neg Hx    Rectal cancer Neg Hx    Stomach cancer Neg Hx    Breast cancer Neg Hx     Tremor Neg Hx    Parkinson's disease Neg Hx     Social History   Socioeconomic History   Marital status: Single    Spouse name: Not on file   Number of children: Not on file   Years of education: Not on file   Highest education level: Not on file  Occupational History   Not on file  Tobacco Use   Smoking status: Former    Packs/day: .05    Types: Cigarettes    Quit date: 01/14/2016    Years since quitting: 7.3   Smokeless tobacco: Never   Tobacco comments:    Smokes Marijuana -since age 54  Substance and Sexual Activity   Alcohol use: Not Currently    Alcohol/week: 6.0 standard drinks of alcohol    Types: 6 Glasses of wine per week    Comment: stopped alcohol   Drug use: Not Currently    Types: Marijuana    Comment: marijuana---does not smoke cigarettes   Sexual activity: Yes    Partners: Male  Comment: 2 partners   Other Topics Concern   Not on file  Social History Narrative   Not on file   Social Determinants of Health   Financial Resource Strain: Low Risk  (11/02/2022)   Overall Financial Resource Strain (CARDIA)    Difficulty of Paying Living Expenses: Not hard at all  Food Insecurity: No Food Insecurity (11/02/2022)   Hunger Vital Sign    Worried About Running Out of Food in the Last Year: Never true    Ran Out of Food in the Last Year: Never true  Transportation Needs: No Transportation Needs (11/02/2022)   PRAPARE - Administrator, Civil Service (Medical): No    Lack of Transportation (Non-Medical): No  Physical Activity: Sufficiently Active (11/02/2022)   Exercise Vital Sign    Days of Exercise per Week: 4 days    Minutes of Exercise per Session: 70 min  Stress: No Stress Concern Present (11/02/2022)   [employer] of Occupational Health - Occupational Stress Questionnaire    Feeling of Stress : Not at all  Social Connections: Socially Integrated (11/02/2022)   Social Connection and Isolation Panel [NHANES]    Frequency of Communication  with Friends and Family: More than three times a week    Frequency of Social Gatherings with Friends and Family: More than three times a week    Attends Religious Services: More than 4 times per year    Active Member of [employer] or Organizations: Yes    Attends Engineer, structural: More than 4 times per year    Marital Status: Married  Catering manager Violence: Not At Risk (11/02/2022)   Humiliation, Afraid, Rape, and Kick questionnaire    Fear of Current or Ex-Partner: No    Emotionally Abused: No    Physically Abused: No    Sexually Abused: No    Outpatient Medications Prior to Visit  Medication Sig Dispense Refill   aspirin EC 81 MG EC tablet Take 1 tablet (81 mg total) by mouth daily.     Cholecalciferol (VITAMIN D) 125 MCG (5000 UT) CAPS Take by mouth.     allopurinol (ZYLOPRIM) 100 MG tablet Take 1 tablet (100 mg total) by mouth daily. 90 tablet 1   amLODipine (NORVASC) 10 MG tablet Take 1 tablet (10 mg total) by mouth daily. 90 tablet 1   atorvastatin (LIPITOR) 10 MG tablet Take 1 tablet (10 mg total) by mouth daily. 90 tablet 2   esomeprazole (NEXIUM) 40 MG capsule Take 1 capsule (40 mg total) by mouth daily. 90 capsule 2   multivitamin-iron-minerals-folic acid (CENTRUM) chewable tablet Chew 1 tablet by mouth daily. (Patient not taking: Reported on 05/03/2023)     Ascorbic Acid (VITAMIN C) 500 MG CAPS Take by mouth. (Patient not taking: Reported on 05/03/2023)     benzonatate (TESSALON) 200 MG capsule Take 1 capsule (200 mg total) by mouth 2 (two) times daily as needed for cough. (Patient not taking: Reported on 05/03/2023) 20 capsule 0   Cholecalciferol (VITAMIN D3 PO) Take 2,000 Units by mouth.     clobetasol cream (TEMOVATE) 0.05 % Apply topically 2 (two) times daily. (Patient not taking: Reported on 05/03/2023) 30 g 1   cyanocobalamin 1000 MCG tablet Take 1,000 mcg by mouth daily.     Glucosamine-Chondroitin (GLUCOSAMINE CHONDR COMPLEX PO) Take by mouth.     Turmeric 500 MG  CAPS Take by mouth.     No facility-administered medications prior to visit.    No Known Allergies  Review of Systems  Constitutional:  Positive for malaise/fatigue. Negative for chills and fever.  Respiratory:  Positive for shortness of breath. Negative for hemoptysis.   Cardiovascular:  Positive for chest pain. Negative for palpitations and leg swelling.  Gastrointestinal:  Negative for abdominal pain, constipation, diarrhea, nausea and vomiting.  Genitourinary:  Negative for dysuria, frequency and urgency.  Neurological:  Negative for dizziness, sensory change and focal weakness.       Objective:    Physical Exam Constitutional:      General: She is not in acute distress.    Appearance: She is not ill-appearing.  HENT:     Mouth/Throat:     Mouth: Mucous membranes are moist.     Pharynx: Oropharynx is clear.  Eyes:     Extraocular Movements: Extraocular movements intact.     Conjunctiva/sclera: Conjunctivae normal.  Cardiovascular:     Rate and Rhythm: Normal rate and regular rhythm.  Pulmonary:     Effort: Pulmonary effort is normal.     Breath sounds: Normal breath sounds.  Musculoskeletal:     Cervical back: Normal range of motion and neck supple. No tenderness.     Right lower leg: No edema.     Left lower leg: No edema.  Lymphadenopathy:     Cervical: No cervical adenopathy.  Skin:    General: Skin is warm and dry.     Findings: No erythema.  Neurological:     General: No focal deficit present.     Mental Status: She is alert and oriented to person, place, and time.     Cranial Nerves: No cranial nerve deficit.     Sensory: No sensory deficit.     Motor: No weakness.     Coordination: Coordination normal.     Gait: Gait normal.  Psychiatric:        Mood and Affect: Mood normal.        Behavior: Behavior normal.        Thought Content: Thought content normal.      BP 110/72 (BP Location: Left Arm, Patient Position: Sitting, Cuff Size: Large)   Pulse  92   Temp (!) 97.3 F (36.3 C) (Temporal)   Ht 5' 5.5" (1.664 m)   Wt 221 lb (100.2 kg)   LMP 04/26/2008   SpO2 97%   BMI 36.22 kg/m  Wt Readings from Last 3 Encounters:  05/03/23 221 lb (100.2 kg)  11/02/22 220 lb (99.8 kg)  11/02/22 220 lb (99.8 kg)       Assessment & Plan:   Problem List Items Addressed This Visit       Cardiovascular and Mediastinum   Primary hypertension    Controlled. Continue medications and low sodium diet.       Relevant Medications   amLODipine (NORVASC) 10 MG tablet   atorvastatin (LIPITOR) 10 MG tablet   Other Relevant Orders   CBC with Differential/Platelet   Comprehensive metabolic panel   DG Chest 2 View (Completed)   EKG 12-Lead     Musculoskeletal and Integument   Eczema   Relevant Medications   clobetasol cream (TEMOVATE) 0.05 %   Osteopenia    Last DEXA 07/2021. Counseling on getting adequate calcium 1,200 mg and taking vitamin D 1,000 IUs daily. Weight bearing exercise as well. Recheck DEXA in 07/2023.         Other   Abnormal EKG    Sinus rhythm with PVC, T wave abnormal in V1 which is new and flat T  waves in V6.       Relevant Orders   Ambulatory referral to Cardiology   Chest pain   Relevant Orders   D-dimer, quantitative (Completed)   Troponin I (High Sensitivity) (Completed)   Brain natriuretic peptide   DG Chest 2 View (Completed)   Ambulatory referral to Cardiology   EKG 12-Lead   CT Angio Chest Pulmonary Embolism (PE) W or WO Contrast   Chest pain with moderate risk for cardiac etiology    Order labs including D-dimer, troponin, BNP and chest XR. Referral to cardiology. Follow up pending labs. Advised to call 911 or go to the ED if worsening.       Relevant Orders   Ambulatory referral to Cardiology   EKG 12-Lead   Chronic gout    Continue allopurinol. Only started this last week.       Relevant Medications   allopurinol (ZYLOPRIM) 100 MG tablet   DOE (dyspnea on exertion) - Primary    Chest XR,  BNP, D-dimer, troponin ordered along with other labs.       Relevant Orders   CBC with Differential/Platelet   Comprehensive metabolic panel   D-dimer, quantitative (Completed)   TSH   T4, free   Troponin I (High Sensitivity) (Completed)   Brain natriuretic peptide   DG Chest 2 View (Completed)   Ambulatory referral to Cardiology   EKG 12-Lead   CT Angio Chest Pulmonary Embolism (PE) W or WO Contrast   Former smoker   Relevant Orders   DG Chest 2 View (Completed)   Mixed hyperlipidemia    Continue statin therapy. Follow up pending lipids.       Relevant Medications   amLODipine (NORVASC) 10 MG tablet   atorvastatin (LIPITOR) 10 MG tablet   Other Relevant Orders   Lipid panel   Vitamin D deficiency   Other Visit Diagnoses     Positive D dimer       Relevant Orders   CT Angio Chest Pulmonary Embolism (PE) W or WO Contrast      Addendum:  D-dimer mildly elevated. Based on current symptoms. I have ordered a STAT CTA to rule out PE  I have discontinued Salley Scarlet. Mayol's Cholecalciferol (VITAMIN D3 PO), Vitamin C, cyanocobalamin, Turmeric, Glucosamine-Chondroitin (GLUCOSAMINE CHONDR COMPLEX PO), and benzonatate. I am also having her maintain her aspirin EC, multivitamin-iron-minerals-folic acid, Vitamin D, clobetasol cream, esomeprazole, amLODipine, allopurinol, and atorvastatin.  Meds ordered this encounter  Medications   clobetasol cream (TEMOVATE) 0.05 %    Sig: Apply topically 2 (two) times daily.    Dispense:  30 g    Refill:  1   esomeprazole (NEXIUM) 40 MG capsule    Sig: Take 1 capsule (40 mg total) by mouth daily.    Dispense:  90 capsule    Refill:  2   amLODipine (NORVASC) 10 MG tablet    Sig: Take 1 tablet (10 mg total) by mouth daily.    Dispense:  90 tablet    Refill:  1   allopurinol (ZYLOPRIM) 100 MG tablet    Sig: Take 1 tablet (100 mg total) by mouth daily.    Dispense:  90 tablet    Refill:  1   atorvastatin (LIPITOR) 10 MG tablet    Sig:  Take 1 tablet (10 mg total) by mouth daily.    Dispense:  90 tablet    Refill:  2

## 2023-05-03 NOTE — Assessment & Plan Note (Signed)
Controlled. Continue medications and low sodium diet.

## 2023-05-03 NOTE — Assessment & Plan Note (Signed)
Chest XR, BNP, D-dimer, troponin ordered along with other labs.

## 2023-05-04 ENCOUNTER — Ambulatory Visit
Admission: RE | Admit: 2023-05-04 | Discharge: 2023-05-04 | Disposition: A | Discharge status: Home / Self Care | Payer: Medicare Other | Source: Ambulatory Visit | Attending: Family Medicine | Admitting: Family Medicine

## 2023-05-04 DIAGNOSIS — R079 Chest pain, unspecified: Secondary | ICD-10-CM | POA: Diagnosis not present

## 2023-05-04 DIAGNOSIS — R7989 Other specified abnormal findings of blood chemistry: Secondary | ICD-10-CM

## 2023-05-04 DIAGNOSIS — R0609 Other forms of dyspnea: Secondary | ICD-10-CM

## 2023-05-04 MED ORDER — IOPAMIDOL (ISOVUE-370) INJECTION 76%
75.0000 mL | Freq: Once | INTRAVENOUS | Status: AC | PRN
  Administered 2023-05-04: 75 mL via INTRAVENOUS

## 2023-05-04 NOTE — Progress Notes (Signed)
Please call her with the following:    Lind Covert news. Your CT did not show a blood clot or anything worrisome. You have some scarring or chronic changes of your lungs due to smoking in the past.  

## 2023-05-12 ENCOUNTER — Ambulatory Visit: Payer: Medicare Other | Admitting: Family Medicine

## 2023-05-25 ENCOUNTER — Encounter (INDEPENDENT_AMBULATORY_CARE_PROVIDER_SITE_OTHER): Payer: Self-pay

## 2023-06-02 ENCOUNTER — Other Ambulatory Visit: Payer: Self-pay | Admitting: Family Medicine

## 2023-06-02 DIAGNOSIS — Z1231 Encounter for screening mammogram for malignant neoplasm of breast: Secondary | ICD-10-CM

## 2023-06-16 ENCOUNTER — Encounter: Payer: Self-pay | Admitting: Family Medicine

## 2023-06-16 ENCOUNTER — Ambulatory Visit (INDEPENDENT_AMBULATORY_CARE_PROVIDER_SITE_OTHER): Payer: Medicare Other | Admitting: Family Medicine

## 2023-06-16 VITALS — BP 124/80 | HR 85 | Temp 97.3°F | Ht 65.5 in | Wt 221.0 lb

## 2023-06-16 DIAGNOSIS — R0609 Other forms of dyspnea: Secondary | ICD-10-CM | POA: Diagnosis not present

## 2023-06-16 DIAGNOSIS — E782 Mixed hyperlipidemia: Secondary | ICD-10-CM | POA: Diagnosis not present

## 2023-06-16 DIAGNOSIS — I10 Essential (primary) hypertension: Secondary | ICD-10-CM

## 2023-06-16 DIAGNOSIS — R944 Abnormal results of kidney function studies: Secondary | ICD-10-CM | POA: Diagnosis not present

## 2023-06-16 DIAGNOSIS — M1A00X0 Idiopathic chronic gout, unspecified site, without tophus (tophi): Secondary | ICD-10-CM | POA: Diagnosis not present

## 2023-06-16 LAB — BASIC METABOLIC PANEL
BUN: 13 mg/dL (ref 6–23)
CO2: 26 mEq/L (ref 19–32)
Calcium: 9.9 mg/dL (ref 8.4–10.5)
Chloride: 104 mEq/L (ref 96–112)
Creatinine, Ser: 1.13 mg/dL (ref 0.40–1.20)
GFR: 49.98 mL/min — ABNORMAL LOW (ref >60.00)
Glucose, Bld: 112 mg/dL — ABNORMAL HIGH (ref 70–99)
Potassium: 3.8 mEq/L (ref 3.5–5.1)
Sodium: 140 mEq/L (ref 135–145)

## 2023-06-16 LAB — URIC ACID: Uric Acid, Serum: 6.1 mg/dL (ref 2.4–7.0)

## 2023-06-16 NOTE — Assessment & Plan Note (Addendum)
Controlled. Continue medications and low sodium diet.  Check renal function.

## 2023-06-16 NOTE — Progress Notes (Signed)
Subjective:     Patient ID: Becky Lara, female    DOB: 1956-07-14, 67 y.o.   MRN: [id number]  Chief Complaint  Patient presents with   Medical Management of Chronic Issues    6 week f/u, doing pretty good Hydrated.    HPI  Discussed the use of AI scribe software for clinical note transcription with the patient, who gave verbal consent to proceed.  History of Present Illness          This is a 6 wk follow up visit for abnormal renal function and chest pain with DOE at her previous visit.  Negative PE work up.   Denies any recent chest pain. Mild DOE.   Appt with cardiologist next month.   States she feels like her old self.  No new concerns.  Denies recent gout flares.  Taking allopurinol consistently now.  Reports good medication compliance.    Health Maintenance Due  Topic Date Due   INFLUENZA VACCINE  05/26/2023    Past Medical History:  Diagnosis Date   AKI (acute kidney injury) (HCC) 12/19/2014   Eczema    Esophageal reflux    Gastroesophageal reflux disease 03/09/2022   GERD (gastroesophageal reflux disease)    Gout    HTN (hypertension) 12/19/2014   Hyperlipidemia    Hypertension    Hypokalemia 12/19/2014   Osteopenia    Thoracic degenerative disc disease 12/08/2017   Per XR   Tobacco abuse    pt denies smoking cigarettes currently or as history, marijuana use only    Past Surgical History:  Procedure Laterality Date   COLONOSCOPY     2017   FOOT SURGERY     POLYPECTOMY     2017 TA x 1   TUBAL LIGATION      Family History  Problem Relation Age of Onset   Diabetes Mother    Heart attack Mother    Heart disease Mother 16       MI   Hyperlipidemia Mother    Heart disease Father 47       MI   Hypertension Brother    Alzheimer's disease Maternal Grandmother    Hypertension Daughter    Hypertension Son    Colon cancer Neg Hx    Colon polyps Neg Hx    Esophageal cancer Neg Hx    Rectal cancer Neg Hx    Stomach cancer Neg Hx     Breast cancer Neg Hx    Tremor Neg Hx    Parkinson's disease Neg Hx     Social History   Socioeconomic History   Marital status: Single    Spouse name: Not on file   Number of children: Not on file   Years of education: Not on file   Highest education level: Not on file  Occupational History   Not on file  Tobacco Use   Smoking status: Former    Current packs/day: 0.00    Types: Cigarettes    Quit date: 01/14/2016    Years since quitting: 7.4   Smokeless tobacco: Never   Tobacco comments:    Smokes Marijuana -since age 20  Substance and Sexual Activity   Alcohol use: Not Currently    Alcohol/week: 6.0 standard drinks of alcohol    Types: 6 Glasses of wine per week    Comment: stopped alcohol   Drug use: Not Currently    Types: Marijuana    Comment: marijuana---does not smoke cigarettes   Sexual activity: Yes  Partners: Male    Comment: 2 partners   Other Topics Concern   Not on file  Social History Narrative   Not on file   Social Determinants of Health   Financial Resource Strain: Low Risk  (11/02/2022)   Overall Financial Resource Strain (CARDIA)    Difficulty of Paying Living Expenses: Not hard at all  Food Insecurity: No Food Insecurity (11/02/2022)   Hunger Vital Sign    Worried About Running Out of Food in the Last Year: Never true    Ran Out of Food in the Last Year: Never true  Transportation Needs: No Transportation Needs (11/02/2022)   PRAPARE - Administrator, Civil Service (Medical): No    Lack of Transportation (Non-Medical): No  Physical Activity: Sufficiently Active (11/02/2022)   Exercise Vital Sign    Days of Exercise per Week: 4 days    Minutes of Exercise per Session: 70 min  Stress: No Stress Concern Present (11/02/2022)   [employer] of Occupational Health - Occupational Stress Questionnaire    Feeling of Stress : Not at all  Social Connections: Socially Integrated (11/02/2022)   Social Connection and Isolation Panel [NHANES]     Frequency of Communication with Friends and Family: More than three times a week    Frequency of Social Gatherings with Friends and Family: More than three times a week    Attends Religious Services: More than 4 times per year    Active Member of [employer] or Organizations: Yes    Attends Engineer, structural: More than 4 times per year    Marital Status: Married  Catering manager Violence: Not At Risk (11/02/2022)   Humiliation, Afraid, Rape, and Kick questionnaire    Fear of Current or Ex-Partner: No    Emotionally Abused: No    Physically Abused: No    Sexually Abused: No    Outpatient Medications Prior to Visit  Medication Sig Dispense Refill   allopurinol (ZYLOPRIM) 100 MG tablet Take 1 tablet (100 mg total) by mouth daily. 90 tablet 1   amLODipine (NORVASC) 10 MG tablet Take 1 tablet (10 mg total) by mouth daily. 90 tablet 1   aspirin EC 81 MG EC tablet Take 1 tablet (81 mg total) by mouth daily.     atorvastatin (LIPITOR) 10 MG tablet Take 1 tablet (10 mg total) by mouth daily. 90 tablet 2   Cholecalciferol (VITAMIN D) 125 MCG (5000 UT) CAPS Take by mouth.     clobetasol cream (TEMOVATE) 0.05 % Apply topically 2 (two) times daily. 30 g 1   esomeprazole (NEXIUM) 40 MG capsule Take 1 capsule (40 mg total) by mouth daily. 90 capsule 2   multivitamin-iron-minerals-folic acid (CENTRUM) chewable tablet Chew 1 tablet by mouth daily.     No facility-administered medications prior to visit.    No Known Allergies  Review of Systems  Constitutional:  Negative for chills, fever, malaise/fatigue and weight loss.  Respiratory:  Positive for shortness of breath. Negative for cough and wheezing.        Mild DOE- ongoing   Cardiovascular:  Negative for chest pain, palpitations and leg swelling.  Gastrointestinal:  Negative for abdominal pain, constipation, diarrhea, nausea and vomiting.  Genitourinary:  Negative for dysuria, frequency and urgency.  Musculoskeletal:  Negative for  falls and joint pain.  Neurological:  Negative for dizziness, focal weakness and headaches.  Psychiatric/Behavioral:  Negative for depression. The patient is not nervous/anxious.        Objective:  Physical Exam Constitutional:      General: She is not in acute distress.    Appearance: She is not ill-appearing.  Eyes:     Extraocular Movements: Extraocular movements intact.     Conjunctiva/sclera: Conjunctivae normal.  Cardiovascular:     Rate and Rhythm: Normal rate.  Pulmonary:     Effort: Pulmonary effort is normal.  Musculoskeletal:     Cervical back: Normal range of motion and neck supple.     Right lower leg: No edema.     Left lower leg: No edema.  Skin:    General: Skin is warm and dry.  Neurological:     General: No focal deficit present.     Mental Status: She is alert and oriented to person, place, and time.  Psychiatric:        Mood and Affect: Mood normal.        Behavior: Behavior normal.        Thought Content: Thought content normal.      BP 124/80 (BP Location: Left Arm, Patient Position: Sitting, Cuff Size: Large)   Pulse 85   Temp (!) 97.3 F (36.3 C) (Temporal)   Ht 5' 5.5" (1.664 m)   Wt 221 lb (100.2 kg)   LMP 04/26/2008   SpO2 98%   BMI 36.22 kg/m  Wt Readings from Last 3 Encounters:  06/16/23 221 lb (100.2 kg)  05/03/23 221 lb (100.2 kg)  11/02/22 220 lb (99.8 kg)       Assessment & Plan:   Problem List Items Addressed This Visit       Cardiovascular and Mediastinum   Primary hypertension - Primary    Controlled. Continue medications and low sodium diet.  Check renal function.      Relevant Orders   Basic metabolic panel     Other   Chronic gout    Continue allopurinol.  No recent gout attacks.  Check uric acid level today and follow-up.      Relevant Orders   Uric acid   Decreased GFR    Reports being well-hydrated today.  Recheck renal function.      Relevant Orders   Basic metabolic panel   DOE (dyspnea on  exertion)    Reports being in her usual state of health.  Denies recent chest pain.  DOE has improved.  Workup negative at her last visit including for PE.  Upcoming appointment with cardiology for further evaluation.       Mixed hyperlipidemia    Continue statin therapy and low-fat diet.       I am having Salley Scarlet. Stigler maintain her aspirin EC, multivitamin-iron-minerals-folic acid, Vitamin D, clobetasol cream, esomeprazole, amLODipine, allopurinol, and atorvastatin.  No orders of the defined types were placed in this encounter.

## 2023-06-16 NOTE — Assessment & Plan Note (Signed)
Continue allopurinol.  No recent gout attacks.  Check uric acid level today and follow-up.

## 2023-06-16 NOTE — Patient Instructions (Signed)
Please go downstairs for labs before you leave.  Continue your current medications.  Call and reschedule with your neurologist.  Remember you will be due for your next bone density test in October.  You should call to schedule this now.  See the cardiologist as scheduled next month.  I will see you back in 6 months or sooner if needed.

## 2023-06-16 NOTE — Assessment & Plan Note (Signed)
Reports being in her usual state of health.  Denies recent chest pain.  DOE has improved.  Workup negative at her last visit including for PE.  Upcoming appointment with cardiology for further evaluation.

## 2023-06-16 NOTE — Assessment & Plan Note (Signed)
Reports being well-hydrated today.  Recheck renal function.

## 2023-06-16 NOTE — Assessment & Plan Note (Signed)
Continue statin therapy and low fat diet

## 2023-06-21 ENCOUNTER — Other Ambulatory Visit: Payer: Self-pay | Admitting: Family Medicine

## 2023-06-21 DIAGNOSIS — R944 Abnormal results of kidney function studies: Secondary | ICD-10-CM

## 2023-06-21 DIAGNOSIS — I10 Essential (primary) hypertension: Secondary | ICD-10-CM

## 2023-06-21 MED ORDER — LISINOPRIL 5 MG PO TABS: 5.0000 mg | ORAL_TABLET | Freq: Every day | ORAL | 0 refills | Status: DC

## 2023-06-21 NOTE — Progress Notes (Signed)
Please let her know the following and schedule her visit with me:  I would like to restart a medication called lisinopril. You took this in the past for your blood pressure and it also helps your kidney function. Let's start this and follow up with me in 6 weeks please. Make sure you are staying hydrated.

## 2023-07-13 ENCOUNTER — Encounter: Payer: Self-pay | Admitting: Interventional Cardiology

## 2023-07-13 ENCOUNTER — Ambulatory Visit: Payer: Medicare Other | Attending: Interventional Cardiology | Admitting: Interventional Cardiology

## 2023-07-13 VITALS — BP 130/80 | HR 94 | Ht 65.5 in | Wt 220.0 lb

## 2023-07-13 DIAGNOSIS — I10 Essential (primary) hypertension: Secondary | ICD-10-CM | POA: Diagnosis not present

## 2023-07-13 DIAGNOSIS — E782 Mixed hyperlipidemia: Secondary | ICD-10-CM | POA: Diagnosis not present

## 2023-07-13 DIAGNOSIS — R072 Precordial pain: Secondary | ICD-10-CM | POA: Diagnosis not present

## 2023-07-13 DIAGNOSIS — E669 Obesity, unspecified: Secondary | ICD-10-CM

## 2023-07-13 DIAGNOSIS — R0789 Other chest pain: Secondary | ICD-10-CM | POA: Diagnosis not present

## 2023-07-13 DIAGNOSIS — R0609 Other forms of dyspnea: Secondary | ICD-10-CM

## 2023-07-13 MED ORDER — METOPROLOL TARTRATE 100 MG PO TABS: ORAL_TABLET | ORAL | 0 refills | Status: DC

## 2023-07-13 NOTE — Progress Notes (Signed)
Cardiology Office Note   Date:  07/13/2023   ID:  Becky Lara, DOB Dec 28, 1955, MRN [id number]  PCP:  Avanell Shackleton, NP-C    No chief complaint on file.  Chest tightness, shortness of breath  Wt Readings from Last 3 Encounters:  07/13/23 220 lb (99.8 kg)  06/16/23 221 lb (100.2 kg)  05/03/23 221 lb (100.2 kg)       History of Present Illness: Becky Lara is a 67 y.o. female who is being seen today for the evaluation of chest pain and DOE at the request of Suezanne Jacquet, Vickie L, NP-C.   She has risk factors for heart disease including hypertension, former smoking and hyperlipidemia.  Sx worse with walking up hill.  She stopped going to the gym about 1 year due to "getting lazy when it got cold."  Several episodes of dyspnea on exertion.  She does admit to a poor diet with frequent junk food.  Denies : Dizziness. Leg edema. Nitroglycerin use. Orthopnea. Palpitations. Paroxysmal nocturnal dyspnea.  Syncope.    Past Medical History:  Diagnosis Date   AKI (acute kidney injury) (HCC) 12/19/2014   Eczema    Esophageal reflux    Gastroesophageal reflux disease 03/09/2022   GERD (gastroesophageal reflux disease)    Gout    HTN (hypertension) 12/19/2014   Hyperlipidemia    Hypertension    Hypokalemia 12/19/2014   Osteopenia    Thoracic degenerative disc disease 12/08/2017   Per XR   Tobacco abuse    pt denies smoking cigarettes currently or as history, marijuana use only    Past Surgical History:  Procedure Laterality Date   COLONOSCOPY     2017   FOOT SURGERY     POLYPECTOMY     2017 TA x 1   TUBAL LIGATION       Current Outpatient Medications  Medication Sig Dispense Refill   allopurinol (ZYLOPRIM) 100 MG tablet Take 1 tablet (100 mg total) by mouth daily. 90 tablet 1   amLODipine (NORVASC) 10 MG tablet Take 1 tablet (10 mg total) by mouth daily. 90 tablet 1   aspirin EC 81 MG EC tablet Take 1 tablet (81 mg total) by mouth daily.     atorvastatin  (LIPITOR) 10 MG tablet Take 1 tablet (10 mg total) by mouth daily. 90 tablet 2   Cholecalciferol (VITAMIN D) 125 MCG (5000 UT) CAPS Take by mouth.     clobetasol cream (TEMOVATE) 0.05 % Apply topically 2 (two) times daily. 30 g 1   esomeprazole (NEXIUM) 40 MG capsule Take 1 capsule (40 mg total) by mouth daily. 90 capsule 2   multivitamin-iron-minerals-folic acid (CENTRUM) chewable tablet Chew 1 tablet by mouth daily.     lisinopril (ZESTRIL) 5 MG tablet Take 1 tablet (5 mg total) by mouth daily. (Patient not taking: Reported on 07/13/2023) 90 tablet 0   No current facility-administered medications for this visit.    Allergies:   Patient has no known allergies.    Social History:  The patient  reports that she quit smoking about 7 years ago. Her smoking use included cigarettes. She has never used smokeless tobacco. She reports that she does not currently use alcohol after a past usage of about 6.0 standard drinks of alcohol per week. She reports that she does not currently use drugs after having used the following drugs: Marijuana.   Family History:  The patient's family history includes Alzheimer's disease in her maternal grandmother; Diabetes in her mother;  Heart attack in her mother; Heart disease (age of onset: 76) in her mother; Heart disease (age of onset: 29) in her father; Hyperlipidemia in her mother; Hypertension in her brother, daughter, and son.    ROS:  Please see the history of present illness.   Otherwise, review of systems are positive for DOE.   All other systems are reviewed and negative.    PHYSICAL EXAM: VS:  BP 130/80   Pulse 94   Ht 5' 5.5" (1.664 m)   Wt 220 lb (99.8 kg)   LMP 04/26/2008   SpO2 96%   BMI 36.05 kg/m  , BMI Body mass index is 36.05 kg/m. GEN: Well nourished, well developed, in no acute distress HEENT: normal Neck: no JVD, carotid bruits, or masses Cardiac: RRR; no murmurs, rubs, or gallops,no edema  Respiratory:  clear to auscultation  bilaterally, normal work of breathing GI: soft, nontender, nondistended, + BS MS: no deformity or atrophy Skin: warm and dry, no rash Neuro:  Strength and sensation are intact Psych: euthymic mood, full affect   EKG:   The ekg ordered today demonstrates NSR, LVH, nonspecific ST changes   Recent Labs: 05/03/2023: ALT 13; Hemoglobin 14.1; Platelets 232.0; Pro B Natriuretic peptide (BNP) 35.0; TSH 2.62 06/16/2023: BUN 13; Creatinine, Ser 1.13; Potassium 3.8; Sodium 140   Lipid Panel    Component Value Date/Time   CHOL 166 05/03/2023 0843   CHOL 154 02/13/2021 0954   TRIG 84.0 05/03/2023 0843   HDL 58.30 05/03/2023 0843   HDL 79 02/13/2021 0954   CHOLHDL 3 05/03/2023 0843   VLDL 16.8 05/03/2023 0843   LDLCALC 91 05/03/2023 0843   LDLCALC 64 02/13/2021 0954     Other studies Reviewed: Additional studies/ records that were reviewed today with results demonstrating: Labs reviewed: LDL 91 in July 2024, HDL 58, triglycerides 84. Normal LVEF by 2016 echo   ASSESSMENT AND PLAN:  Chest discomfort: Some tightness but predominant symptom is shortness of breath.  Several risk factors for CAD.  Plan CTA coronaries to evaluate for obstructive coronary artery disease (metoprolol 100 mg prior).  If calcium score is elevated, may need to intensify attempts at lipid-lowering to lower risk going forward. Dyspnea on exertion: Worse with walking up hills.  Could be anginal equivalent.  Check echo as well.  Likely LVH.  Hypertension: Avoid excessive salthospitalThe current medical regimen is effective;  continue present plan and medications.  Hyperlipidemia: Whole food, plant-based diet.  High-fiber diet.  Avoid processed foods.  Continue statin with potential for dose changes noted above.  Continue atorvastatin 10 mg daily. FOrmer smoker (MJ): Stopped about 2 years ago. Morbid obesity:  Decrease sugar intake.  Weight loss will help stamina.   Current medicines are reviewed at length with the  patient today.  The patient concerns regarding her medicines were addressed.  The following changes have been made:  No change  Labs/ tests ordered today include:   Orders Placed This Encounter  Procedures   EKG 12-Lead    Recommend 150 minutes/week of aerobic exercise Low fat, low carb, high fiber diet recommended  Disposition:   FU based on test results   Signed, Lance Muss, MD  07/13/2023 1:20 PM    [facility] [address] [place], [place], [place]  [address] Phone: [phone]; Fax: [phone]

## 2023-07-13 NOTE — Patient Instructions (Addendum)
Medication Instructions:  Your physician recommends that you continue on your current medications as directed. Please refer to the Current Medication list given to you today.  *If you need a refill on your cardiac medications before your next appointment, please call your pharmacy*   Lab Work: Lab work to be done today--BMP If you have labs (blood work) drawn today and your tests are completely normal, you will receive your results only by: MyChart Message (if you have MyChart) OR A paper copy in the mail If you have any lab test that is abnormal or we need to change your treatment, we will call you to review the results.   Testing/Procedures: Your physician has requested that you have an echocardiogram. Echocardiography is a painless test that uses sound waves to create images of your heart. It provides your doctor with information about the size and shape of your heart and how well your heart's chambers and valves are working. This procedure takes approximately one hour. There are no restrictions for this procedure. Please do NOT wear cologne, perfume, aftershave, or lotions (deodorant is allowed). Please arrive 15 minutes prior to your appointment time.  Your physician has requested that you have cardiac CT. Cardiac computed tomography (CT) is a painless test that uses an x-ray machine to take clear, detailed pictures of your heart. For further information please visit [email or web address] Please follow instruction sheet as given.     Follow-Up: At [facility], you and your health needs are our priority.  As part of our continuing mission to provide you with exceptional heart care, we have created designated Provider Care Teams.  These Care Teams include your primary Cardiologist (physician) and Advanced Practice Providers (APPs -  Physician Assistants and Nurse Practitioners) who all work together to provide you with the care you need, when you need it.  We recommend signing  up for the patient portal called "MyChart".  Sign up information is provided on this After Visit Summary.  MyChart is used to connect with patients for Virtual Visits (Telemedicine).  Patients are able to view lab/test results, encounter notes, upcoming appointments, etc.  Non-urgent messages can be sent to your provider as well.   To learn more about what you can do with MyChart, go to [email or web address].    Your next appointment:   Based on results  Provider:       Other Instructions   Your cardiac CT will be scheduled at one of the below locations:   [facility] [address] [place], [place] [address] [phone]  OR  [facility] [address] [place], [place] [address] [phone]  OR   [facility] [address] [place], [place] [address] [phone]  If scheduled at [facility], please arrive at the [facility] and Children's Entrance (Entrance C2) of [facility] 30 minutes prior to test start time. You can use the FREE valet parking offered at entrance C (encouraged to control the heart rate for the test)  Proceed to the [facility] Radiology Department (first floor) to check-in and test prep.  All radiology patients and guests should use entrance C2 at [facility], accessed from [facility], even though the hospital's physical address listed is [address]    If scheduled at [facility] or [facility], please arrive 15 mins early for check-in and test prep.  There is spacious parking and easy access  to the radiology department from the [facility] Heart and Vascular entrance. Please enter here and check-in with the desk attendant.   Please follow these instructions carefully (unless otherwise directed):  An IV will be required for this test and Nitroglycerin will be given.    On the  Night Before the Test: Be sure to Drink plenty of water. Do not consume any caffeinated/decaffeinated beverages or chocolate 12 hours prior to your test. Do not take any antihistamines 12 hours prior to your test.   On the Day of the Test: Drink plenty of water until 1 hour prior to the test. Do not eat any food 1 hour prior to test. You may take your regular medications prior to the test.  Take metoprolol (Lopressor) two hours prior to test. If you take Furosemide/Hydrochlorothiazide/Spironolactone, please HOLD on the morning of the test. FEMALES- please wear underwire-free bra if available, avoid dresses & tight clothing        After the Test: Drink plenty of water. After receiving IV contrast, you may experience a mild flushed feeling. This is normal. On occasion, you may experience a mild rash up to 24 hours after the test. This is not dangerous. If this occurs, you can take Benadryl 25 mg and increase your fluid intake. If you experience trouble breathing, this can be serious. If it is severe call 911 IMMEDIATELY. If it is mild, please call our office. If you take any of these medications: Glipizide/Metformin, Avandament, Glucavance, please do not take 48 hours after completing test unless otherwise instructed.  We will call to schedule your test 2-4 weeks out understanding that some insurance companies will need an authorization prior to the service being performed.   For more information and frequently asked questions, please visit our website : [email or web address]  For non-scheduling related questions, please contact the cardiac imaging nurse navigator should you have any questions/concerns: Cardiac Imaging Nurse Navigators Direct Office Dial: [phone]   For scheduling needs, including cancellations and rescheduling, please call [place], [phone].  High-Fiber Eating Plan Fiber, also called dietary fiber, is found in foods such as fruits, vegetables,  whole grains, and beans. A high-fiber diet can be good for your health. Your health care provider may recommend a high-fiber diet to help: Prevent trouble pooping (constipation). Lower your cholesterol. Treat the following conditions: Hemorrhoids. This is inflammation of veins in the anus. Inflammation of specific areas of the digestive tract. Irritable bowel syndrome (IBS). This is a problem of the large intestine, also called the colon, that sometimes causes belly pain and bloating. Prevent overeating as part of a weight-loss plan. Lower the risk of heart disease, type 2 diabetes, and certain cancers. What are tips for following this plan? Reading food labels  Check the nutrition facts label on foods for the amount of dietary fiber. Choose foods that have 4 grams of fiber or more per serving. The recommended goals for how much fiber you should eat each day include: Males 40 years old or younger: 30-34 g. Males over 75 years old: 28-34 g. Females 3 years old or younger: 25-28 g. Females over 24 years old: 22-25 g. Your daily fiber goal is _____________ g. Shopping Choose whole fruits and vegetables instead of processed. For example, choose apples instead of apple juice or applesauce. Choose a variety of high-fiber foods such as avocados, lentils, oats, and pinto beans. Read the nutrition facts label on foods. Check for foods with added fiber. These foods often have high sugar and salt (sodium) amounts per  serving. Cooking Use whole-grain flour for baking and cooking. Cook with brown rice instead of white rice. Make meals that have a lot of beans and vegetables in them, such as chili or vegetable-based soups. Meal planning Start the day with a breakfast that is high in fiber, such as a cereal that has 5 g of fiber or more per serving. Eat breads and cereals that are made with whole-grain flour instead of refined flour or white flour. Eat brown rice, bulgur wheat, or millet instead of  white rice. Use beans in place of meat in soups, salads, and pasta dishes. Be sure that half of the grains you eat each day are whole grains. General information You can get the recommended amount of dietary fiber by: Eating a variety of fruits, vegetables, grains, nuts, and beans. Taking a fiber supplement if you aren't able to eat enough fiber. It's better to get fiber through food than from a supplement. Slowly increase how much fiber you eat. If you increase the amount of fiber you eat too quickly, you may have bloating, cramping, or gas. Drink plenty of water to help you digest fiber. Choose high-fiber snacks, such as berries, raw vegetables, nuts, and popcorn. What foods should I eat? Fruits Berries. Pears. Apples. Oranges. Avocado. Prunes and raisins. Dried figs. Vegetables Sweet potatoes. Spinach. Kale. Artichokes. Cabbage. Broccoli. Cauliflower. Green peas. Carrots. Squash. Grains Whole-grain breads. Multigrain cereal. Oats and oatmeal. Brown rice. Barley. Bulgur wheat. Millet. Quinoa. Bran muffins. Popcorn. Rye wafer crackers. Meats and other proteins Navy beans, kidney beans, and pinto beans. Soybeans. Split peas. Lentils. Nuts and seeds. Dairy Fiber-fortified yogurt. Fortified means that fiber has been added to the product. Beverages Fiber-fortified soy milk. Fiber-fortified orange juice. Other foods Fiber bars. The items listed above may not be all the foods and drinks you can have. Talk to a dietitian to learn more. What foods should I avoid? Fruits Fruit juice. Cooked, strained fruit. Vegetables Fried potatoes. Canned vegetables. Well-cooked vegetables. Grains White bread. Pasta made with refined flour. White rice. Meats and other proteins Fatty meat. Fried chicken or fried fish. Dairy Milk. Cream cheese. Sour cream. Fats and oils Butters. Beverages Soft drinks. Other foods Cakes and pastries. The items listed above may not be all the foods and drinks you  should avoid. Talk to a dietitian to learn more. This information is not intended to replace advice given to you by your health care provider. Make sure you discuss any questions you have with your health care provider. Document Revised: 01/03/2023 Document Reviewed: 01/03/2023 Elsevier Patient Education  2024 [employer].

## 2023-07-14 ENCOUNTER — Encounter (HOSPITAL_COMMUNITY): Payer: Self-pay

## 2023-07-14 ENCOUNTER — Other Ambulatory Visit: Payer: Self-pay | Admitting: Interventional Cardiology

## 2023-07-14 LAB — BASIC METABOLIC PANEL
BUN/Creatinine Ratio: 10 — ABNORMAL LOW (ref 12–28)
BUN: 10 mg/dL (ref 8–27)
CO2: 22 mmol/L (ref 20–29)
Calcium: 9.8 mg/dL (ref 8.7–10.3)
Chloride: 103 mmol/L (ref 96–106)
Creatinine, Ser: 1.03 mg/dL — ABNORMAL HIGH (ref 0.57–1.00)
Glucose: 90 mg/dL (ref 70–99)
Potassium: 4 mmol/L (ref 3.5–5.2)
Sodium: 139 mmol/L (ref 134–144)
eGFR: 59 mL/min/{1.73_m2} — ABNORMAL LOW (ref >59)

## 2023-07-18 ENCOUNTER — Ambulatory Visit
Admission: RE | Admit: 2023-07-18 | Discharge: 2023-07-18 | Disposition: A | Discharge status: Home / Self Care | Payer: Medicare Other | Source: Ambulatory Visit | Attending: Family Medicine | Admitting: Family Medicine

## 2023-07-18 DIAGNOSIS — Z1231 Encounter for screening mammogram for malignant neoplasm of breast: Secondary | ICD-10-CM | POA: Diagnosis not present

## 2023-07-19 ENCOUNTER — Ambulatory Visit (HOSPITAL_COMMUNITY)
Admission: RE | Admit: 2023-07-19 | Discharge: 2023-07-19 | Disposition: A | Discharge status: Home / Self Care | Payer: Medicare Other | Source: Ambulatory Visit | Attending: Interventional Cardiology | Admitting: Interventional Cardiology

## 2023-07-19 DIAGNOSIS — R072 Precordial pain: Secondary | ICD-10-CM | POA: Insufficient documentation

## 2023-07-19 MED ORDER — NITROGLYCERIN 0.4 MG SL SUBL
SUBLINGUAL_TABLET | SUBLINGUAL | Status: AC
  Filled 2023-07-19: qty 2

## 2023-07-19 MED ORDER — NITROGLYCERIN 0.4 MG SL SUBL
0.8000 mg | SUBLINGUAL_TABLET | Freq: Once | SUBLINGUAL | Status: AC
  Administered 2023-07-19: 0.8 mg via SUBLINGUAL

## 2023-07-19 MED ORDER — IOHEXOL 350 MG/ML SOLN
95.0000 mL | Freq: Once | INTRAVENOUS | Status: AC | PRN
  Administered 2023-07-19: 95 mL via INTRAVENOUS

## 2023-07-28 ENCOUNTER — Ambulatory Visit: Payer: Medicare Other | Admitting: Family Medicine

## 2023-07-28 ENCOUNTER — Other Ambulatory Visit: Payer: Self-pay | Admitting: Family Medicine

## 2023-07-28 ENCOUNTER — Encounter: Payer: Self-pay | Admitting: Family Medicine

## 2023-07-28 VITALS — BP 128/76 | HR 86 | Temp 97.6°F | Ht 65.5 in | Wt 225.0 lb

## 2023-07-28 DIAGNOSIS — I10 Essential (primary) hypertension: Secondary | ICD-10-CM

## 2023-07-28 DIAGNOSIS — M8580 Other specified disorders of bone density and structure, unspecified site: Secondary | ICD-10-CM

## 2023-07-28 DIAGNOSIS — E782 Mixed hyperlipidemia: Secondary | ICD-10-CM

## 2023-07-28 DIAGNOSIS — Z23 Encounter for immunization: Secondary | ICD-10-CM

## 2023-07-28 DIAGNOSIS — M1A00X0 Idiopathic chronic gout, unspecified site, without tophus (tophi): Secondary | ICD-10-CM

## 2023-07-28 DIAGNOSIS — E2839 Other primary ovarian failure: Secondary | ICD-10-CM

## 2023-07-28 DIAGNOSIS — K219 Gastro-esophageal reflux disease without esophagitis: Secondary | ICD-10-CM

## 2023-07-28 LAB — LIPID PANEL
Cholesterol: 159 mg/dL (ref 0–200)
HDL: 66.1 mg/dL (ref >39.00)
LDL Cholesterol: 78 mg/dL (ref 0–99)
NonHDL: 93.29
Total CHOL/HDL Ratio: 2
Triglycerides: 75 mg/dL (ref 0.0–149.0)
VLDL: 15 mg/dL (ref 0.0–40.0)

## 2023-07-28 LAB — COMPREHENSIVE METABOLIC PANEL
ALT: 13 U/L (ref 0–35)
AST: 16 U/L (ref 0–37)
Albumin: 4.2 g/dL (ref 3.5–5.2)
Alkaline Phosphatase: 112 U/L (ref 39–117)
BUN: 8 mg/dL (ref 6–23)
CO2: 26 meq/L (ref 19–32)
Calcium: 9.7 mg/dL (ref 8.4–10.5)
Chloride: 104 meq/L (ref 96–112)
Creatinine, Ser: 1.04 mg/dL (ref 0.40–1.20)
GFR: 55.18 mL/min — ABNORMAL LOW (ref >60.00)
Glucose, Bld: 106 mg/dL — ABNORMAL HIGH (ref 70–99)
Potassium: 3.9 meq/L (ref 3.5–5.1)
Sodium: 137 meq/L (ref 135–145)
Total Bilirubin: 0.8 mg/dL (ref 0.2–1.2)
Total Protein: 8 g/dL (ref 6.0–8.3)

## 2023-07-28 NOTE — Assessment & Plan Note (Signed)
Last DEXA 07/2021. Counseling on getting adequate calcium 1,200 mg and taking vitamin D 1,000 IUs daily. Weight bearing exercise as well. DEXA ordered.

## 2023-07-28 NOTE — Assessment & Plan Note (Signed)
Controlled. Takes PPI prn.

## 2023-07-28 NOTE — Assessment & Plan Note (Signed)
DEXA ordered.  

## 2023-07-28 NOTE — Assessment & Plan Note (Signed)
Continue statin therapy and low-fat diet. Check fasting lipids.

## 2023-07-28 NOTE — Progress Notes (Signed)
Subjective:     Patient ID: Becky Lara, female    DOB: [birth year], 67 y.o.   MRN: [id number]  Chief Complaint  Patient presents with   Medical Management of Chronic Issues    6 week f/u, checking on BP and need to recheck DEXA    HPI  Discussed the use of AI scribe software for clinical note transcription with the patient, who gave verbal consent to proceed.  History of Present Illness          Here to follow up on chronic health conditions.   Due for DEXA this month.  Is not taking calcium or vitamin D.   Undergoing cardiac work up with cardiologist.   No new concerns   There are no preventive care reminders to display for this patient.  Past Medical History:  Diagnosis Date   AKI (acute kidney injury) (HCC) 12/19/2014   Eczema    Esophageal reflux    Gastroesophageal reflux disease 03/09/2022   GERD (gastroesophageal reflux disease)    Gout    HTN (hypertension) 12/19/2014   Hyperlipidemia    Hypertension    Hypokalemia 12/19/2014   Osteopenia    Thoracic degenerative disc disease 12/08/2017   Per XR   Tobacco abuse    pt denies smoking cigarettes currently or as history, marijuana use only    Past Surgical History:  Procedure Laterality Date   COLONOSCOPY     2017   FOOT SURGERY     POLYPECTOMY     2017 TA x 1   TUBAL LIGATION      Family History  Problem Relation Age of Onset   Diabetes Mother    Heart attack Mother    Heart disease Mother 7       MI   Hyperlipidemia Mother    Heart disease Father 65       MI   Hypertension Brother    Alzheimer's disease Maternal Grandmother    Hypertension Daughter    Hypertension Son    Colon cancer Neg Hx    Colon polyps Neg Hx    Esophageal cancer Neg Hx    Rectal cancer Neg Hx    Stomach cancer Neg Hx    Breast cancer Neg Hx    Tremor Neg Hx    Parkinson's disease Neg Hx     Social History   Socioeconomic History   Marital status: Single    Spouse name: Not on file   Number of  children: Not on file   Years of education: Not on file   Highest education level: Not on file  Occupational History   Not on file  Tobacco Use   Smoking status: Former    Current packs/day: 0.00    Types: Cigarettes    Quit date: 01/14/2016    Years since quitting: 7.5   Smokeless tobacco: Never   Tobacco comments:    Smokes Marijuana -since age 39  Substance and Sexual Activity   Alcohol use: Not Currently    Alcohol/week: 6.0 standard drinks of alcohol    Types: 6 Glasses of wine per week    Comment: stopped alcohol   Drug use: Not Currently    Types: Marijuana    Comment: marijuana---does not smoke cigarettes   Sexual activity: Not Currently    Partners: Male    Comment: 2 partners   Other Topics Concern   Not on file  Social History Narrative   Not on file   Social Determinants  of Health   Financial Resource Strain: Low Risk  (11/02/2022)   Overall Financial Resource Strain (CARDIA)    Difficulty of Paying Living Expenses: Not hard at all  Food Insecurity: No Food Insecurity (11/02/2022)   Hunger Vital Sign    Worried About Running Out of Food in the Last Year: Never true    Ran Out of Food in the Last Year: Never true  Transportation Needs: No Transportation Needs (11/02/2022)   PRAPARE - Administrator, Civil Service (Medical): No    Lack of Transportation (Non-Medical): No  Physical Activity: Sufficiently Active (11/02/2022)   Exercise Vital Sign    Days of Exercise per Week: 4 days    Minutes of Exercise per Session: 70 min  Stress: No Stress Concern Present (11/02/2022)   [employer] of Occupational Health - Occupational Stress Questionnaire    Feeling of Stress : Not at all  Social Connections: Socially Integrated (11/02/2022)   Social Connection and Isolation Panel [NHANES]    Frequency of Communication with Friends and Family: More than three times a week    Frequency of Social Gatherings with Friends and Family: More than three times a week     Attends Religious Services: More than 4 times per year    Active Member of [employer] or Organizations: Yes    Attends Engineer, structural: More than 4 times per year    Marital Status: Married  Catering manager Violence: Not At Risk (11/02/2022)   Humiliation, Afraid, Rape, and Kick questionnaire    Fear of Current or Ex-Partner: No    Emotionally Abused: No    Physically Abused: No    Sexually Abused: No    Outpatient Medications Prior to Visit  Medication Sig Dispense Refill   allopurinol (ZYLOPRIM) 100 MG tablet Take 1 tablet (100 mg total) by mouth daily. 90 tablet 1   amLODipine (NORVASC) 10 MG tablet Take 1 tablet (10 mg total) by mouth daily. 90 tablet 1   aspirin EC 81 MG EC tablet Take 1 tablet (81 mg total) by mouth daily.     atorvastatin (LIPITOR) 10 MG tablet Take 1 tablet (10 mg total) by mouth daily. 90 tablet 2   Cholecalciferol (VITAMIN D) 125 MCG (5000 UT) CAPS Take by mouth.     clobetasol cream (TEMOVATE) 0.05 % Apply topically 2 (two) times daily. 30 g 1   esomeprazole (NEXIUM) 40 MG capsule Take 1 capsule (40 mg total) by mouth daily. 90 capsule 2   lisinopril (ZESTRIL) 5 MG tablet Take 1 tablet (5 mg total) by mouth daily. 90 tablet 0   multivitamin-iron-minerals-folic acid (CENTRUM) chewable tablet Chew 1 tablet by mouth daily.     metoprolol tartrate (LOPRESSOR) 100 MG tablet Take one tablet by mouth 2 hours prior to CT scan 1 tablet 0   No facility-administered medications prior to visit.    No Known Allergies  Review of Systems  Constitutional:  Negative for chills and fever.  Respiratory:  Negative for shortness of breath.   Cardiovascular:  Negative for chest pain, palpitations and leg swelling.  Gastrointestinal:  Negative for abdominal pain, constipation, diarrhea, nausea and vomiting.  Genitourinary:  Negative for dysuria, frequency and urgency.  Neurological:  Negative for dizziness.       Objective:    Physical Exam Constitutional:       General: She is not in acute distress.    Appearance: She is not ill-appearing.  Eyes:     Extraocular Movements:  Extraocular movements intact.     Conjunctiva/sclera: Conjunctivae normal.  Cardiovascular:     Rate and Rhythm: Normal rate.  Pulmonary:     Effort: Pulmonary effort is normal.  Musculoskeletal:     Cervical back: Normal range of motion and neck supple.  Skin:    General: Skin is warm and dry.  Neurological:     General: No focal deficit present.     Mental Status: She is alert and oriented to person, place, and time.  Psychiatric:        Mood and Affect: Mood normal.        Behavior: Behavior normal.        Thought Content: Thought content normal.      BP 128/76 (BP Location: Left Arm, Patient Position: Sitting, Cuff Size: Large)   Pulse 86   Temp 97.6 F (36.4 C) (Temporal)   Ht 5' 5.5" (1.664 m)   Wt 225 lb (102.1 kg)   LMP 04/26/2008   SpO2 95%   BMI 36.87 kg/m  Wt Readings from Last 3 Encounters:  07/28/23 225 lb (102.1 kg)  07/13/23 220 lb (99.8 kg)  06/16/23 221 lb (100.2 kg)       Assessment & Plan:   Problem List Items Addressed This Visit       Cardiovascular and Mediastinum   Primary hypertension    Controlled. Continue medications and low sodium diet.  Check renal function.      Relevant Orders   Comprehensive metabolic panel     Digestive   GERD without esophagitis    Controlled. Takes PPI prn.         Musculoskeletal and Integument   Osteopenia - Primary    Last DEXA 07/2021. Counseling on getting adequate calcium 1,200 mg and taking vitamin D 1,000 IUs daily. Weight bearing exercise as well. DEXA ordered.       Relevant Orders   DG Bone Density     Other   Chronic gout    Continue allopurinol.  No recent gout attacks.  Uric acid level in a good range.       Estrogen deficiency    DEXA ordered       Relevant Orders   DG Bone Density   Mixed hyperlipidemia    Continue statin therapy and low-fat diet. Check  fasting lipids.       Relevant Orders   Lipid panel   Comprehensive metabolic panel   Other Visit Diagnoses     Need for influenza vaccination       Relevant Orders   Flu Vaccine Trivalent High Dose (Fluad) (Completed)       I have discontinued Salley Scarlet. Sing's metoprolol tartrate. I am also having her maintain her aspirin EC, multivitamin-iron-minerals-folic acid, Vitamin D, clobetasol cream, esomeprazole, amLODipine, allopurinol, atorvastatin, and lisinopril.  No orders of the defined types were placed in this encounter.

## 2023-07-28 NOTE — Assessment & Plan Note (Signed)
Controlled. Continue medications and low sodium diet.  Check renal function.

## 2023-07-28 NOTE — Assessment & Plan Note (Signed)
Continue allopurinol.  No recent gout attacks.  Uric acid level in a good range.

## 2023-07-29 ENCOUNTER — Ambulatory Visit (HOSPITAL_COMMUNITY): Payer: Medicare Other | Attending: Cardiovascular Disease

## 2023-07-29 DIAGNOSIS — R0609 Other forms of dyspnea: Secondary | ICD-10-CM | POA: Diagnosis not present

## 2023-07-29 DIAGNOSIS — R0789 Other chest pain: Secondary | ICD-10-CM | POA: Insufficient documentation

## 2023-07-29 LAB — ECHOCARDIOGRAM COMPLETE
Area-P 1/2: 3.77 cm2
S' Lateral: 2.2 cm

## 2023-08-01 ENCOUNTER — Telehealth: Payer: Self-pay | Admitting: Interventional Cardiology

## 2023-08-01 NOTE — Telephone Encounter (Signed)
Rollene Rotunda, MD 07/30/2023  4:46 PM EDT     EF was normal.  No significant valve abnormalities.  No change in therapy.  Call Ms. Nistler with the results and send results to [place], Vickie L, NP-C.  If dyspnea persists please have her follow up with APP or with new physician provider.  Call Ms. Roxan Hockey with the results and send results to Avanell Shackleton, NP-C    The patient has been notified of the result and verbalized understanding.  All questions (if any) were answered. Frutoso Schatz, RN 08/01/2023 9:51 AM

## 2023-08-01 NOTE — Telephone Encounter (Signed)
Patient is requesting to discuss echo results.

## 2023-08-15 NOTE — Progress Notes (Unsigned)
Cardiology Office Note    Patient Name: Becky Lara Date of Encounter: 08/15/2023  Primary Care Provider:  Avanell Shackleton, NP-C Primary Cardiologist:  Lance Muss, MD Primary Electrophysiologist: None   Past Medical History    Past Medical History:  Diagnosis Date   AKI (acute kidney injury) (HCC) 12/19/2014   Eczema    Esophageal reflux    Gastroesophageal reflux disease 03/09/2022   GERD (gastroesophageal reflux disease)    Gout    HTN (hypertension) 12/19/2014   Hyperlipidemia    Hypertension    Hypokalemia 12/19/2014   Osteopenia    Thoracic degenerative disc disease 12/08/2017   Per XR   Tobacco abuse    pt denies smoking cigarettes currently or as history, marijuana use only    History of Present Illness  Becky Lara is a 67 y.o. female with a PMH of HLD, HTN, GERD, gout, tobacco abuse who presents today for complaint of dyspnea on exertion.  Ms. Takayama was seen initially on 07/13/2023 by Dr. Eldridge Dace by referral of her PCP for complaint of chest pain and dyspnea on exertion.  She reported following a poor diet with frequent junk foods.  She was scheduled for coronary CTA due to chest pain that showed no significant coronary disease and calcium score of 0.  She also underwent a 2D echo that showed no significant valve abnormalities with EF of 60 to 65% and grade 1 DD with normal PASP.  During today's visit the patient reports*** .  Patient denies chest pain, palpitations, dyspnea, PND, orthopnea, nausea, vomiting, dizziness, syncope, edema, weight gain, or early satiety.  ***Notes: -Last ischemic evaluation: -Last echo: -Interim ED visits: Review of Systems  Please see the history of present illness.    All other systems reviewed and are otherwise negative except as noted above.  Physical Exam    Wt Readings from Last 3 Encounters:  07/28/23 225 lb (102.1 kg)  07/13/23 220 lb (99.8 kg)  06/16/23 221 lb (100.2 kg)   BJ:YNWGN were no vitals  filed for this visit.,There is no height or weight on file to calculate BMI. GEN: Well nourished, well developed in no acute distress Neck: No JVD; No carotid bruits Pulmonary: Clear to auscultation without rales, wheezing or rhonchi  Cardiovascular: Normal rate. Regular rhythm. Normal S1. Normal S2.   Murmurs: There is no murmur.  ABDOMEN: Soft, non-tender, non-distended EXTREMITIES:  No edema; No deformity   EKG/LABS/ Recent Cardiac Studies   ECG personally reviewed by me today - ***  Risk Assessment/Calculations:   {Does this patient have ATRIAL FIBRILLATION?:[phone]}      Lab Results  Component Value Date   WBC 9.6 05/03/2023   HGB 14.1 05/03/2023   HCT 43.0 05/03/2023   MCV 85.0 05/03/2023   PLT 232.0 05/03/2023   Lab Results  Component Value Date   CREATININE 1.04 07/28/2023   BUN 8 07/28/2023   NA 137 07/28/2023   K 3.9 07/28/2023   CL 104 07/28/2023   CO2 26 07/28/2023   Lab Results  Component Value Date   CHOL 159 07/28/2023   HDL 66.10 07/28/2023   LDLCALC 78 07/28/2023   TRIG 75.0 07/28/2023   CHOLHDL 2 07/28/2023    Lab Results  Component Value Date   HGBA1C 4.8 07/04/2018   Assessment & Plan    1.  Dyspnea on exertion: -Patient underwent 2D echo that showed normal valve function and EF of 60 to 65% with normal PASP -She reports today***  2.  Essential hypertension: -Patient's blood pressure today was***  3.  Hyperlipidemia: -Patient's LDL cholesterol was***  4.  Former tobacco abuse: -Patient quit 2 years ago  5.  Chest pain: -Today patient reports***  6.  Obesity:      Disposition: Follow-up with Lance Muss, MD or APP in *** months {Are you ordering a CV Procedure (e.g. stress test, cath, DCCV, TEE, etc)?   Press F2        :[id number]}   Signed, Napoleon Form, Leodis Rains, NP 08/15/2023, 6:45 PM [facility]

## 2023-08-16 ENCOUNTER — Encounter: Payer: Self-pay | Admitting: Nurse Practitioner

## 2023-08-16 ENCOUNTER — Ambulatory Visit: Payer: Medicare Other | Attending: Nurse Practitioner | Admitting: Nurse Practitioner

## 2023-08-16 VITALS — BP 120/80 | HR 86 | Ht 65.0 in | Wt 222.4 lb

## 2023-08-16 DIAGNOSIS — E782 Mixed hyperlipidemia: Secondary | ICD-10-CM

## 2023-08-16 DIAGNOSIS — I10 Essential (primary) hypertension: Secondary | ICD-10-CM

## 2023-08-16 DIAGNOSIS — E669 Obesity, unspecified: Secondary | ICD-10-CM | POA: Diagnosis not present

## 2023-08-16 DIAGNOSIS — R0609 Other forms of dyspnea: Secondary | ICD-10-CM

## 2023-08-16 DIAGNOSIS — R0789 Other chest pain: Secondary | ICD-10-CM

## 2023-08-16 NOTE — Patient Instructions (Signed)
Medication Instructions:  Your physician recommends that you continue on your current medications as directed. Please refer to the Current Medication list given to you today.  *If you need a refill on your cardiac medications before your next appointment, please call your pharmacy*   Lab Work: None ordered   Testing/Procedures: None ordered   Follow-Up: At [facility], you and your health needs are our priority.  As part of our continuing mission to provide you with exceptional heart care, we have created designated Provider Care Teams.  These Care Teams include your primary Cardiologist (physician) and Advanced Practice Providers (APPs -  Physician Assistants and Nurse Practitioners) who all work together to provide you with the care you need, when you need it.  We recommend signing up for the patient portal called "MyChart".  Sign up information is provided on this After Visit Summary.  MyChart is used to connect with patients for Virtual Visits (Telemedicine).  Patients are able to view lab/test results, encounter notes, upcoming appointments, etc.  Non-urgent messages can be sent to your provider as well.   To learn more about what you can do with MyChart, go to [email or web address].    Your next appointment:   As needed    Provider:   Robin Searing, NP   Other Instructions Care Guide referral has been placed.

## 2023-08-17 ENCOUNTER — Telehealth (HOSPITAL_BASED_OUTPATIENT_CLINIC_OR_DEPARTMENT_OTHER): Payer: Self-pay

## 2023-08-17 DIAGNOSIS — Z0000 Encounter for general adult medical examination without abnormal findings: Secondary | ICD-10-CM

## 2023-08-17 NOTE — Telephone Encounter (Signed)
Called patient to discuss health coaching per referral. Patient is interested in participating in health coaching to improving healthy eating behaviors and increase physical activity. Patient has been scheduled for her initial session in-person on 10-30 at 8:30am.   Renaee Munda, MS, ERHD, [facility]  Care Guide, Health & Wellness Coach [address], [address] [place] [address] Telephone: [phone] Email: [email or web address]

## 2023-08-24 ENCOUNTER — Ambulatory Visit: Payer: Medicare Other | Attending: Cardiology

## 2023-08-24 DIAGNOSIS — Z0000 Encounter for general adult medical examination without abnormal findings: Secondary | ICD-10-CM

## 2023-08-24 NOTE — Progress Notes (Unsigned)
HEALTH & WELLNESS COACHING INITIAL INTAKE    Appointment Outcome: Completed, Session #: Initial                        Start time: 8:30am   End time: 9:36am   Total Mins: 66 minutes     What are the Patient's goals from Coaching?  Patient wants to improve her healthy eating habits and increase physical activity.   Why did they seek coaching now? Patient is having a challenge with eating sugary food instead of home cooked meals. Patient stated that she has gained weight since she has retired and started having challenges with the taste of her food.    Readiness - What stage is the patient in regarding their goal(s)?   Patient is in the contemplation stage of changing her eating habits. Patient is in action stage of increasing her physical activity.    Coaching Progress Notes:  Patient reported that she is having a challenge with eating any type of food that she cooked because she prefers to eat sweets such as candy bars, ice cream, cookies, and drink juice for meals due to food tasting bland. Patient mentioned that she has taken notice of this change in taste after retiring from work 2 years ago, but it has gotten worse. Patient explained that she was used to eating on the go while she was working a 12-hour shift.   Patient mentioned that she would eat fast food such as [place] and pizza. Patient stated that when she worked, she did not cook except for the days that she was off. Patient shared that now she cooks at home and do not eat fast food. Patient stated that she cooks a variety of meals such as soups, ribs, chicken, and make salads, but finds that she wastes the food because it does not taste good to her since she prefers sweets.   Patient stated that she wants to improve her eating habits and increase her physical activity because she has gain weight since she has retired from work. Patient reported that she currently walks one day per week at 8:00am in her daughter's  neighborhood for 20 minutes. Patient mentioned that having to drive to her daughter's neighborhood to work discourages her from walking more during the week. Patient stated that she does cleaning on Fridays where she is able to do housework for physical activity, along with walking around the mall.    Coaching Outcomes Discussed with patient alternative ways that she could increase her physical activity at home. Patient determined that she could walk inside her home following an exercise video on Mondays and Wednesdays in addition to the day that she walks with her daughter and clean on Fridays. Patient stated that she would continue her exercise at 8:00am on those mornings.   Discussed with patient where she would like to start with improving her eating habits. Patient determined that she wants to cut back on how many sweets she is eating throughout the day. Patient also expressed the need to increase her water intake but has trouble drinking plain water. Patient mentioned that she is considered becoming vegan or plant-based to see if that helps with meat not tasting as well to her.       AGREEMENTS SECTION   Overall Goal(s): Reduce overall sugar intake by aiming for 25 grams of added sugar per day  Increase physical activity to 150 minutes per week  Agreement/Action Steps:  Reduce overall sugar intake Read food labels for amount of added sugar per serving Aim for a total of 25 grams of added sugar per day Practice portion control per recommended serving size on food label Reduce number of servings of sugar containing foods consumed in a day Increase water intake to 5 glasses of water per day (40 oz) Use sugar free flavored drink pack  Increase physical activity Walk one day per week with daughter for 20 mins Walk on Mon & Wed at home following video for 15 minutes Continue to walk in mall Clean on Fridays    Agreement Signed & Returned? Reviewed Coaching  Agreement and Code of Ethics with Patient during initial session. Answered any questions the patient had if any regarding the Coaching Agreement and Code of Ethics. Patient verbally agreed to adhere to the Coaching Agreement and to abide by the Code of Ethics.  Mailed patient with a hard/electronic copy of the Coaching Agreement and Code of Ethics.     Resources: Patient was emailed an outline of her action steps for her review. Patient was provided information on how to read food labels and practicing portion control.   Note: Recommended that patient discuss with PCP her concerns with not being able to taste her food unless it is sweet.

## 2023-08-25 NOTE — Progress Notes (Signed)
Chief Complaint  Patient presents with   Follow-up    Pt in room 2 alone. Here for Parkinson follow up. Pt reports being stable, pt said tremors are okay, but does noticed increased tremors if she eats specific foods.    HISTORY OF PRESENT ILLNESS:  08/29/23 ALL:  Becky Lara is a 67 y.o. female here today for follow up for PD. She was last seen by Dr Frances Furbish 10/2022 and doing well. She had declined medication management of tremor as she felt it was not bothersome. Since, she reports symptoms are stable. She states tremor is not bothersome. She notes occasional tremor of ace and hand. Seems worse after taking BP meds or if she eats more sugar. No changes in gait. She may stoop a little more. She was going to the gym regularly to do yoga but has not been recently. She denies difficulty swallowing. Appetite is good. Mood is good. No changes or concerns with memory. She sleeps for about 3-4 hours then wakes up. Sometimes it is hard for her to go back to sleep but she usually does for about an hour or so. She usually sleeps about 6 hours total each night. She does watch TV until she falls asleep. She is followed closely by PCP.    HISTORY (copied from Dr Teofilo Pod previous note)  Becky Lara is a 67 year old right-handed woman with an underlying medical history of reflux disease, eczema, gout, hypertension, hyperlipidemia, hypokalemia, osteopenia, smoking, vitamin D deficiency, and obesity, who presents for follow-up consultation of her Parkinsonism.  The patient is unaccompanied today.  I last saw her on 04/22/2022, at which time she reported feeling stable.  We talked about her DaTscan results from 04/16/2022 which indicated decreased radiotracer activity in the posterior right putamen.  We mutually agreed to continue to monitor her symptoms and examination and not start any new medications at the time.   Today, 10/28/2022: She reports feeling stable, no new symptoms, tremor is intermittent and does  not bother her, she tries to stay active and goes to the [facility] 5 or 6 times a week.  She tries to hydrate well.  No new cognitive issues.  She would like to continue to monitor her symptoms and not utilize any medication for parkinsonism.   REVIEW OF SYSTEMS: Out of a complete 14 system review of symptoms, the patient complains only of the following symptoms, bilateral hand and facial tremor and all other reviewed systems are negative.   ALLERGIES: No Known Allergies   HOME MEDICATIONS: Outpatient Medications Prior to Visit  Medication Sig Dispense Refill   allopurinol (ZYLOPRIM) 100 MG tablet Take 1 tablet (100 mg total) by mouth daily. 90 tablet 1   amLODipine (NORVASC) 10 MG tablet Take 1 tablet (10 mg total) by mouth daily. 90 tablet 1   aspirin EC 81 MG EC tablet Take 1 tablet (81 mg total) by mouth daily.     atorvastatin (LIPITOR) 10 MG tablet Take 1 tablet (10 mg total) by mouth daily. 90 tablet 2   Cholecalciferol (VITAMIN D) 125 MCG (5000 UT) CAPS Take by mouth.     clobetasol cream (TEMOVATE) 0.05 % Apply topically 2 (two) times daily. (Patient taking differently: Apply topically as needed.) 30 g 1   esomeprazole (NEXIUM) 40 MG capsule Take 1 capsule (40 mg total) by mouth daily. (Patient taking differently: Take 40 mg by mouth as needed.) 90 capsule 2   lisinopril (ZESTRIL) 5 MG tablet Take 1 tablet (5 mg total)  by mouth daily. 90 tablet 0   multivitamin-iron-minerals-folic acid (CENTRUM) chewable tablet Chew 1 tablet by mouth daily.     No facility-administered medications prior to visit.     PAST MEDICAL HISTORY: Past Medical History:  Diagnosis Date   AKI (acute kidney injury) (HCC) 12/19/2014   Eczema    Esophageal reflux    Gastroesophageal reflux disease 03/09/2022   GERD (gastroesophageal reflux disease)    Gout    HTN (hypertension) 12/19/2014   Hyperlipidemia    Hypertension    Hypokalemia 12/19/2014   Osteopenia    Thoracic degenerative disc disease  12/08/2017   Per XR   Tobacco abuse    pt denies smoking cigarettes currently or as history, marijuana use only     PAST SURGICAL HISTORY: Past Surgical History:  Procedure Laterality Date   COLONOSCOPY     2017   FOOT SURGERY     POLYPECTOMY     2017 TA x 1   TUBAL LIGATION       FAMILY HISTORY: Family History  Problem Relation Age of Onset   Diabetes Mother    Heart attack Mother    Heart disease Mother 97       MI   Hyperlipidemia Mother    Heart disease Father 56       MI   Hypertension Brother    Alzheimer's disease Maternal Grandmother    Hypertension Daughter    Hypertension Son    Colon cancer Neg Hx    Colon polyps Neg Hx    Esophageal cancer Neg Hx    Rectal cancer Neg Hx    Stomach cancer Neg Hx    Breast cancer Neg Hx    Tremor Neg Hx    Parkinson's disease Neg Hx      SOCIAL HISTORY: Social History   Socioeconomic History   Marital status: Single    Spouse name: Not on file   Number of children: Not on file   Years of education: Not on file   Highest education level: Not on file  Occupational History   Not on file  Tobacco Use   Smoking status: Former    Current packs/day: 0.00    Types: Cigarettes    Quit date: 01/14/2016    Years since quitting: 7.6   Smokeless tobacco: Never   Tobacco comments:    Smokes Marijuana -since age 15  Substance and Sexual Activity   Alcohol use: Not Currently    Alcohol/week: 6.0 standard drinks of alcohol    Types: 6 Glasses of wine per week    Comment: stopped alcohol   Drug use: Not Currently    Types: Marijuana    Comment: marijuana---does not smoke cigarettes   Sexual activity: Not Currently    Partners: Male    Comment: 2 partners   Other Topics Concern   Not on file  Social History Narrative   Not on file   Social Determinants of Health   Financial Resource Strain: Low Risk  (11/02/2022)   Overall Financial Resource Strain (CARDIA)    Difficulty of Paying Living Expenses: Not hard at  all  Food Insecurity: No Food Insecurity (11/02/2022)   Hunger Vital Sign    Worried About Running Out of Food in the Last Year: Never true    Ran Out of Food in the Last Year: Never true  Transportation Needs: No Transportation Needs (11/02/2022)   PRAPARE - Administrator, Civil Service (Medical): No  Lack of Transportation (Non-Medical): No  Physical Activity: Sufficiently Active (11/02/2022)   Exercise Vital Sign    Days of Exercise per Week: 4 days    Minutes of Exercise per Session: 70 min  Stress: No Stress Concern Present (11/02/2022)   [employer] of Occupational Health - Occupational Stress Questionnaire    Feeling of Stress : Not at all  Social Connections: Socially Integrated (11/02/2022)   Social Connection and Isolation Panel [NHANES]    Frequency of Communication with Friends and Family: More than three times a week    Frequency of Social Gatherings with Friends and Family: More than three times a week    Attends Religious Services: More than 4 times per year    Active Member of [employer] or Organizations: Yes    Attends Banker Meetings: More than 4 times per year    Marital Status: Married  Catering manager Violence: Not At Risk (11/02/2022)   Humiliation, Afraid, Rape, and Kick questionnaire    Fear of Current or Ex-Partner: No    Emotionally Abused: No    Physically Abused: No    Sexually Abused: No     PHYSICAL EXAM  Vitals:   08/29/23 1420  BP: 126/70  Pulse: 77  Weight: 225 lb (102.1 kg)  Height: 5\' 5"  (1.651 m)   Body mass index is 37.44 kg/m.  Generalized: Well developed, in no acute distress  Cardiology: normal rate and rhythm, no murmur auscultated  Respiratory: clear to auscultation bilaterally    Neurological examination  Mentation: Alert oriented to time, place, history taking. Follows all commands speech and language fluent Cranial nerve II-XII: Pupils were equal round reactive to light. Extraocular movements were full,  visual field were full on confrontational test. Facial sensation and strength were normal. Head turning and shoulder shrug  were normal and symmetric. Motor: The motor testing reveals 5 over 5 strength of all 4 extremities.No tremor noted with exam, today.  Sensory: Sensory testing is intact to soft touch on all 4 extremities. No evidence of extinction is noted.  Coordination: Cerebellar testing reveals good finger-nose-finger and heel-to-shin bilaterally.  Gait and station: Able to push to standing position without difficulty. Gait is normal. Decreased arm swing L>R Reflexes: Deep tendon reflexes are symmetric and normal bilaterally.    DIAGNOSTIC DATA (LABS, IMAGING, TESTING) - I reviewed patient records, labs, notes, testing and imaging myself where available.  Lab Results  Component Value Date   WBC 9.6 05/03/2023   HGB 14.1 05/03/2023   HCT 43.0 05/03/2023   MCV 85.0 05/03/2023   PLT 232.0 05/03/2023      Component Value Date/Time   NA 137 07/28/2023 0845   NA 139 07/13/2023 1345   K 3.9 07/28/2023 0845   CL 104 07/28/2023 0845   CO2 26 07/28/2023 0845   GLUCOSE 106 (H) 07/28/2023 0845   BUN 8 07/28/2023 0845   BUN 10 07/13/2023 1345   CREATININE 1.04 07/28/2023 0845   CREATININE 1.05 (H) 07/06/2017 0814   CALCIUM 9.7 07/28/2023 0845   PROT 8.0 07/28/2023 0845   PROT 7.5 03/09/2022 1028   ALBUMIN 4.2 07/28/2023 0845   ALBUMIN 4.4 03/09/2022 1028   AST 16 07/28/2023 0845   ALT 13 07/28/2023 0845   ALKPHOS 112 07/28/2023 0845   BILITOT 0.8 07/28/2023 0845   BILITOT 0.8 03/09/2022 1028   GFRNONAA 60 07/17/2020 1011   GFRNONAA 57 (L) 07/06/2017 0814   GFRAA 69 07/17/2020 1011   GFRAA 66 07/06/2017 1610  Lab Results  Component Value Date   CHOL 159 07/28/2023   HDL 66.10 07/28/2023   LDLCALC 78 07/28/2023   TRIG 75.0 07/28/2023   CHOLHDL 2 07/28/2023   Lab Results  Component Value Date   HGBA1C 4.8 07/04/2018   No results found for: "VITAMINB12" Lab Results   Component Value Date   TSH 2.62 05/03/2023        No data to display               No data to display           ASSESSMENT AND PLAN  67 y.o. year old female  has a past medical history of AKI (acute kidney injury) (HCC) (12/19/2014), Eczema, Esophageal reflux, Gastroesophageal reflux disease (03/09/2022), GERD (gastroesophageal reflux disease), Gout, HTN (hypertension) (12/19/2014), Hyperlipidemia, Hypertension, Hypokalemia (12/19/2014), Osteopenia, Thoracic degenerative disc disease (12/08/2017), and Tobacco abuse. here with    Primary parkinsonism (HCC)  Tremor of left hand  Gait disturbance  Sleep disturbance  DANAYA GEDDIS is doing well. Tremor is stable. We will continue to monitor for now. Sleep hygiene discussed. May consider trial of melatonin OTC. I have encouraged her to resume regular exercise. Consider PT if she wishes. Healthy lifestyle habits encouraged. She will follow up with PCP as directed. She will return to see me in 6-8 months, sooner if needed. She verbalizes understanding and agreement with this plan.    No orders of the defined types were placed in this encounter.    No orders of the defined types were placed in this encounter.    Shawnie Dapper, MSN, FNP-C 08/29/2023, 3:42 PM  [facility] [address], [address] [place], [place] [address] [phone]

## 2023-08-25 NOTE — Patient Instructions (Signed)
Below is our plan:  We will continue to monitor. Consider PT if you wish. Try to get back in the gym. Try to decrease stimulation at bedtime. You can consider melatonin over the counter. I usually recommend 3mg  to start and up to 5mg  nighty as needed.   Please make sure you are staying well hydrated. I recommend 50-60 ounces daily. Well balanced diet and regular exercise encouraged. Consistent sleep schedule with 6-8 hours recommended.   Please continue follow up with care team as directed.   Follow up with me 6-8 months  You may receive a survey regarding today's visit. I encourage you to leave honest feed back as I do use this information to improve patient care. Thank you for seeing me today!

## 2023-08-29 ENCOUNTER — Encounter: Payer: Self-pay | Admitting: Family Medicine

## 2023-08-29 ENCOUNTER — Ambulatory Visit (INDEPENDENT_AMBULATORY_CARE_PROVIDER_SITE_OTHER): Payer: Medicare Other | Admitting: Family Medicine

## 2023-08-29 VITALS — BP 126/70 | HR 77 | Ht 65.0 in | Wt 225.0 lb

## 2023-08-29 DIAGNOSIS — R251 Tremor, unspecified: Secondary | ICD-10-CM

## 2023-08-29 DIAGNOSIS — R269 Unspecified abnormalities of gait and mobility: Secondary | ICD-10-CM

## 2023-08-29 DIAGNOSIS — G20C Parkinsonism, unspecified: Secondary | ICD-10-CM | POA: Diagnosis not present

## 2023-08-29 DIAGNOSIS — G479 Sleep disorder, unspecified: Secondary | ICD-10-CM | POA: Diagnosis not present

## 2023-09-06 ENCOUNTER — Telehealth: Payer: Self-pay | Admitting: Licensed Clinical Social Worker

## 2023-09-06 NOTE — Telephone Encounter (Addendum)
H&V Care Navigation CSW Progress Note  Clinical Social Worker contacted patient by phone to update her that Health Coach is out of the office and will call her to reschedule upon her return. Left voicemail at [phone]. Provided my number should pt have any questions. Requested patient access team assist with cancelling appt to avoid no show.   Patient is participating in a Managed Medicaid Plan:  No, Medicare and AARP  SDOH Screenings   Food Insecurity: No Food Insecurity (11/02/2022)  Housing: Low Risk  (11/02/2022)  Transportation Needs: No Transportation Needs (11/02/2022)  Utilities: Not At Risk (11/02/2022)  Alcohol Screen: Low Risk  (11/02/2022)  Depression (PHQ2-9): Low Risk  (06/16/2023)  Financial Resource Strain: Low Risk  (11/02/2022)  Physical Activity: Sufficiently Active (11/02/2022)  Social Connections: Socially Integrated (11/02/2022)  Stress: No Stress Concern Present (11/02/2022)  Tobacco Use: Medium Risk (08/29/2023)   Octavio Graves, MSW, LCSW Clinical Social Worker II [facility] Heart[facility]Vascular Care Navigation  [phone]- work cell phone (preferred) [phone]- desk phone

## 2023-09-07 ENCOUNTER — Ambulatory Visit: Payer: Medicare Other

## 2023-09-15 ENCOUNTER — Telehealth: Payer: Self-pay

## 2023-09-15 DIAGNOSIS — Z0000 Encounter for general adult medical examination without abnormal findings: Secondary | ICD-10-CM

## 2023-09-15 NOTE — Telephone Encounter (Signed)
Called patient to reschedule health coaching appointment due to being out of the office on 11/13. Patient did not answer. Left message for patient to return call.   Renaee Munda, MS, ERHD, [facility]  Care Guide, Health & Wellness Coach [address] [place] [address] Telephone: [phone] Email: [email or web address]

## 2023-09-27 ENCOUNTER — Other Ambulatory Visit: Payer: Self-pay | Admitting: Family Medicine

## 2023-09-27 ENCOUNTER — Telehealth: Payer: Self-pay

## 2023-09-27 DIAGNOSIS — I10 Essential (primary) hypertension: Secondary | ICD-10-CM

## 2023-09-27 DIAGNOSIS — Z0000 Encounter for general adult medical examination without abnormal findings: Secondary | ICD-10-CM

## 2023-09-27 DIAGNOSIS — R944 Abnormal results of kidney function studies: Secondary | ICD-10-CM

## 2023-09-27 NOTE — Telephone Encounter (Signed)
Called patient to reschedule health coaching appointment due to being out of office on 11/13. Patient stated that she would like to continue health coaching and has been scheduled for an in-person visit on 12/11 at 9:00am.   Renaee Munda, MS, ERHD, [facility]  Care Guide, Health & Wellness Coach [address], [address] [place] [address] Telephone: [phone] Email: [email or web address]

## 2023-10-05 ENCOUNTER — Ambulatory Visit: Payer: Medicare Other | Attending: Cardiology

## 2023-10-05 DIAGNOSIS — Z0000 Encounter for general adult medical examination without abnormal findings: Secondary | ICD-10-CM

## 2023-10-05 NOTE — Progress Notes (Signed)
Appointment Outcome: Completed, Session #: 1                         Start time: 8:50am   End time: 9:22am   Total Mins: 32 minutes  AGREEMENTS SECTION   Overall Goal(s): Reduce overall sugar intake by aiming for 25 grams of added sugar per day  Increase physical activity to 150 minutes per week                  Agreement/Action Steps:  Reduce overall sugar intake Read food labels for amount of added sugar per serving Aim for a total of 25 grams of added sugar per day Practice portion control per recommended serving size on food label Reduce number of servings of sugar containing foods consumed in a day Increase water intake to 5 glasses of water per day (40 oz) Use sugar free flavored drink pack   Increase physical activity Walk one day per week with daughter for 20 mins Walk on Mon & Wed at home following video for 15 minutes Continue to walk in mall Clean on Fridays    Progress Notes:  Patient stated that she read some food labels when shopping for Thanksgiving food with her daughter. Patient shared that she observed that the 100% fruit juice she drinks contains over 40 grams of sugar, but was not sure the amount of added sugar. Patient expressed that she recognized that the drink contains a lot of sugar and has cut back on drinking it throughout the day (e.g., try to limit to breakfast).   Patient mentioned that she has been aiming to drink 64 oz of water per day. Patient stated that she drinks plain water throughout the day by taking (4) 16.9 oz water bottles and pouring into her drinking cup with a straw. Patient was unaware that she reaching her water consumption goal per day because she felt that she needed to increase her water consumption. Explained to patient that she was reaching her goal depending on the accuracy of the ounces of water per bottle she was drinking. Patient stated that she will check again to ensure that the bottle is 16.9 oz and not 12 oz.   Patient shared  that she is eating one meal per day and do not eat a lot during that time. Patient mentioned that she sometimes by prepare bacon and eggs for breakfast. Patient stated that she is challenged at times when it comes to eating sweets when she is bored or hungry. Patient expressed that when she is craving something sweet she eats an ice cream bar. Patient stated that she tries to refrain from buying junk food. Patient shared that she has eating fruit, which takes the sugar cravings away. Patient mentioned that she has eliminated mini Milky Way candies from her diet because she would eat the entire bag in a day.   Patient reported that she has not be walking with her daughter due to the weather or in the mall. Patient mentioned that the project that she was engaged in where she cleaned on Fridays had concluded at this time and may be something she can do at a later time. Patient stated that she has not been able to start exercising at home following the video. Patient mentioned that her daughter will be helping her setup the video on her tv for easy access. Patient stated that she will talk to her daughter again to determine when she is going to  be able to get started.   Indicators of Success and Accountability:  Patient stated that drinking water has been her biggest success and indicator of accountability because she was not drinking water prior to working on her goals.   Readiness: Patient is in the action stage of reducing overall sugar intake and in the preparation stage of increasing physical activity.  Strengths and Supports: Patient is being supported by her daughter. Patient is relying on being mindful of her behaviors.   Challenges and Barriers: Patient faced challenges with increasing physical activity such as the weather and setting up technology.     Coaching Outcomes: Discussed with patient alternatives to eating sweets when she is bored and hungry. Patient stated that she will try drinking  water first or preparing her a meal/eat fruit to refrain from eating sweets. Patient also will consider engaging in a physical activity to help reduce the craving for sweets.   Patient plans on exercising once daily after her daughter setup the video on her tv. Patient is not sure how long the exercise video is currently. Patient also is interested in walking around her home and/or stretching during commercial breaks. Patient stated that she used to do chair exercises and is thinking about incorporating them as well. Patient was encouraged to go home and engage in a physical activity and decided that she will setup and decorate her Christmas tree.     Overall Goal(s): Reduce overall sugar intake by aiming for 25 grams of added sugar per day  Increase physical activity to 150 minutes per week                  Agreement/Action Steps:  Reduce overall sugar intake Read food labels for amount of added sugar per serving Aim for a total of 25 grams of added sugar per day Increase water intake to 8 glasses of water per day (64 oz) Practice portion control per recommended serving size on food label Reduce number of servings of sugar containing foods consumed in a day Eat fruit, drink water, or engage in physical activity   Increase physical activity Exercise daily at home following video (time unknown) Walk/stretch at home during commercial breaks     Attempted: Fulfilled - Patient has increased her water intake by drinking (4) 16.9oz water bottles per day.  Partial - Patient has read some food labels with daughter while grocery shopping, but not consistently. Patient is practicing portion control by eating less, but not by consuming the recommended serving size on food label.  Not met - Patient has not talking exercising at home using the video for at least 15 minutes.    Not attempted: Deferred - Patient is not currently cleaning on Fridays, but plans to resume.   Dropped/Revised -  Patient will resume walking outdoors when the weather permits. Patient is not walking in the mall at this time.

## 2023-10-20 ENCOUNTER — Telehealth: Payer: Self-pay

## 2023-10-20 ENCOUNTER — Ambulatory Visit: Payer: Medicare Other | Attending: Cardiology

## 2023-10-20 DIAGNOSIS — Z0000 Encounter for general adult medical examination without abnormal findings: Secondary | ICD-10-CM

## 2023-10-20 NOTE — Telephone Encounter (Signed)
Called patient to hold health coaching appointment over the phone as scheduled. Patient did not answer. Left message for patient to return call to hold session or to reschedule if necessary.    Renaee Munda, MS, ERHD, [facility]  Care Guide, Health & Wellness Coach [address], [address] [place] [address] Telephone: [phone] Email: [email or web address]

## 2023-10-25 ENCOUNTER — Telehealth: Payer: Self-pay

## 2023-10-25 DIAGNOSIS — Z0000 Encounter for general adult medical examination without abnormal findings: Secondary | ICD-10-CM

## 2023-10-25 NOTE — Telephone Encounter (Signed)
 Called patient to reschedule missed health coaching appointment. Patient did not answer. Left message for patient to return call to schedule and provided availability.   Greig Ruth, MS, ERHD, [facility]  Care Guide, Health & Wellness Coach [address] [place] [address] Telephone: [phone] Email: [email or web address]

## 2023-10-31 ENCOUNTER — Telehealth: Payer: Self-pay | Admitting: Family Medicine

## 2023-10-31 ENCOUNTER — Ambulatory Visit: Payer: Self-pay | Admitting: Family Medicine

## 2023-10-31 DIAGNOSIS — F17200 Nicotine dependence, unspecified, uncomplicated: Secondary | ICD-10-CM

## 2023-10-31 DIAGNOSIS — R0602 Shortness of breath: Secondary | ICD-10-CM

## 2023-10-31 NOTE — Telephone Encounter (Signed)
 Copied from CRM [id number]. Topic: Clinical - Red Word Triage >> Oct 31, 2023 11:42 AM Cherylynn B wrote: Red Word that prompted transfer to Nurse Triage: Shortness of breath. Patient called in for referral for Lung Specialist which was previously discussed with Dr Lendia. States that the shortness of breath is when she is active or moving around.   Chief Complaint: shortness of breath with exertion Symptoms: none Frequency: ongoing for months Pertinent Negatives: Patient denies chest pain Disposition: [] ED /[] Urgent Care (no appt availability in office) / [x] Appointment(In office/virtual)/ []  [facility]/ [] Home Care/ [] Refused Recommended Disposition /[] [facility]/ []  Follow-up with PCP Additional Notes: The patient reported ongoing shortness of breath with exertion.  She is able to walk to various rooms in her house without getting short of breath but if she has to walk long distances or exercise she becomes short of breath.  She saw cardiology in October and they told her she had fluid on her lungs but did not make any other referral.  She scheduled a pulmonology appointment for Thursday 11/03/2023 but she needs a referral.  She was scheduled for an appointment 11/02/2023 to discuss the referral further.   Reason for Disposition  [1] MODERATE longstanding difficulty breathing (e.g., speaks in phrases, SOB even at rest, pulse 100-120) AND [2] SAME as normal  Answer Assessment - Initial Assessment Questions 1. RESPIRATORY STATUS: Describe your breathing? (e.g., wheezing, shortness of breath, unable to speak, severe coughing)      Shortness of breath  2. ONSET: When did this breathing problem begin?      Ongoing - with exertion  3. PATTERN Does the difficult breathing come and go, or has it been constant since it started?      With exertion  4. SEVERITY: How bad is your breathing? (e.g., mild, moderate, severe)    - MILD: No SOB at rest, mild SOB with walking, speaks  normally in sentences, can lie down, no retractions, pulse < 100.    - MODERATE: SOB at rest, SOB with minimal exertion and prefers to sit, cannot lie down flat, speaks in phrases, mild retractions, audible wheezing, pulse 100-120.    - SEVERE: Very SOB at rest, speaks in single words, struggling to breathe, sitting hunched forward, retractions, pulse > 120      Increased shortness of breath with exercise and walking a long distance  5. RECURRENT SYMPTOM: Have you had difficulty breathing before? If Yes, ask: When was the last time? and What happened that time?      Ongoing  6. CARDIAC HISTORY: Do you have any history of heart disease? (e.g., heart attack, angina, bypass surgery, angioplasty)      Cardiologist said water was on lungs 08/16/2023 7. LUNG HISTORY: Do you have any history of lung disease?  (e.g., pulmonary embolus, asthma, emphysema)     unknown 8. CAUSE: What do you think is causing the breathing problem?      Unsure would like referral to pulmonology  9. OTHER SYMPTOMS: Do you have any other symptoms? (e.g., dizziness, runny nose, cough, chest pain, fever)     none  Protocols used: Breathing Difficulty-A-AH

## 2023-10-31 NOTE — Telephone Encounter (Signed)
 I do not see in recent ov notes about you referring to lung specialist as I only see mention of cardiology for DOE. Are you ok with pulmonology referral?

## 2023-10-31 NOTE — Telephone Encounter (Signed)
 Copied from CRM [id number]. Topic: Referral - Request for Referral >> Oct 31, 2023 11:31 AM Cherylynn B wrote: Did the patient discuss referral with their provider in the last year? Yes (If No - schedule appointment) (If Yes - send message)  Appointment offered? Yes, for Heart doctor  Type of order/referral and detailed reason for visit: Lung Specialist  Preference of office, provider, location: Dr. Ozell America, [facility] [place], [facility] [address]  If referral order, have you been seen by this specialty before? No (If Yes, this issue or another issue? When? Where?  Can we respond through MyChart? Yes

## 2023-10-31 NOTE — Telephone Encounter (Signed)
 Message already sent in other encounter, please see earlier encounter for details

## 2023-11-01 NOTE — Telephone Encounter (Signed)
 Referral placed.

## 2023-11-01 NOTE — Addendum Note (Signed)
 Addended by: Marinus Maw on: 11/01/2023 08:52 AM   Modules accepted: Orders

## 2023-11-02 ENCOUNTER — Ambulatory Visit: Payer: Medicare Other | Admitting: Nurse Practitioner

## 2023-11-03 ENCOUNTER — Institutional Professional Consult (permissible substitution): Payer: Medicare Other | Admitting: Internal Medicine

## 2023-12-01 ENCOUNTER — Encounter: Payer: Self-pay | Admitting: Family Medicine

## 2023-12-01 ENCOUNTER — Ambulatory Visit: Payer: Medicare Other | Admitting: Family Medicine

## 2023-12-01 ENCOUNTER — Ambulatory Visit: Payer: Medicare Other

## 2023-12-01 VITALS — BP 120/80 | HR 89 | Ht 65.25 in | Wt 224.8 lb

## 2023-12-01 VITALS — BP 120/80 | HR 89 | Temp 97.6°F | Ht 65.25 in | Wt 224.0 lb

## 2023-12-01 DIAGNOSIS — R944 Abnormal results of kidney function studies: Secondary | ICD-10-CM

## 2023-12-01 DIAGNOSIS — R0609 Other forms of dyspnea: Secondary | ICD-10-CM | POA: Diagnosis not present

## 2023-12-01 DIAGNOSIS — M8580 Other specified disorders of bone density and structure, unspecified site: Secondary | ICD-10-CM

## 2023-12-01 DIAGNOSIS — R0602 Shortness of breath: Secondary | ICD-10-CM | POA: Diagnosis not present

## 2023-12-01 DIAGNOSIS — M1A00X0 Idiopathic chronic gout, unspecified site, without tophus (tophi): Secondary | ICD-10-CM

## 2023-12-01 DIAGNOSIS — Z87891 Personal history of nicotine dependence: Secondary | ICD-10-CM

## 2023-12-01 DIAGNOSIS — R9389 Abnormal findings on diagnostic imaging of other specified body structures: Secondary | ICD-10-CM | POA: Diagnosis not present

## 2023-12-01 DIAGNOSIS — R0902 Hypoxemia: Secondary | ICD-10-CM

## 2023-12-01 DIAGNOSIS — I10 Essential (primary) hypertension: Secondary | ICD-10-CM

## 2023-12-01 DIAGNOSIS — Z0000 Encounter for general adult medical examination without abnormal findings: Secondary | ICD-10-CM

## 2023-12-01 DIAGNOSIS — J9811 Atelectasis: Secondary | ICD-10-CM | POA: Diagnosis not present

## 2023-12-01 DIAGNOSIS — J984 Other disorders of lung: Secondary | ICD-10-CM | POA: Diagnosis not present

## 2023-12-01 LAB — CBC
HCT: 45.3 % (ref 36.0–46.0)
Hemoglobin: 14.6 g/dL (ref 12.0–15.0)
MCHC: 32.3 g/dL (ref 30.0–36.0)
MCV: 85.9 fL (ref 78.0–100.0)
Platelets: 206 10*3/uL (ref 150.0–400.0)
RBC: 5.27 Mil/uL — ABNORMAL HIGH (ref 3.87–5.11)
RDW: 14.4 % (ref 11.5–15.5)
WBC: 10.7 10*3/uL — ABNORMAL HIGH (ref 4.0–10.5)

## 2023-12-01 LAB — COMPREHENSIVE METABOLIC PANEL
ALT: 16 U/L (ref 0–35)
AST: 18 U/L (ref 0–37)
Albumin: 4.3 g/dL (ref 3.5–5.2)
Alkaline Phosphatase: 106 U/L (ref 39–117)
BUN: 6 mg/dL (ref 6–23)
CO2: 27 meq/L (ref 19–32)
Calcium: 9.5 mg/dL (ref 8.4–10.5)
Chloride: 104 meq/L (ref 96–112)
Creatinine, Ser: 1.06 mg/dL (ref 0.40–1.20)
GFR: 53.8 mL/min — ABNORMAL LOW (ref >60.00)
Glucose, Bld: 106 mg/dL — ABNORMAL HIGH (ref 70–99)
Potassium: 3.8 meq/L (ref 3.5–5.1)
Sodium: 140 meq/L (ref 135–145)
Total Bilirubin: 0.8 mg/dL (ref 0.2–1.2)
Total Protein: 8.3 g/dL (ref 6.0–8.3)

## 2023-12-01 MED ORDER — LISINOPRIL 5 MG PO TABS: ORAL_TABLET | ORAL | 1 refills | Status: DC

## 2023-12-01 NOTE — Progress Notes (Signed)
 Subjective:     Patient ID: Becky Lara, female    DOB: [birth year], 67 y.o.   MRN: [id number]  Chief Complaint  Patient presents with   Shortness of Breath    Continuous SOB, wants referral to pulmonary. Cardiologist did a test and showed liquid in my lungs but states they aren't doing anything about it so has some questions regarding that.    HPI   History of Present Illness         Here with concerns regarding persistent shortness of breath with exertion. States she is able to walk 1 or 2 laps at the mall but eventually she tires and he has trouble getting her breath and has to sit down and rest. She has been seeing cardiology and has had a fairly unremarkable workup  History of daily marijuana smoking.  Denies ever smoking cigarettes.  She stopped smoking completely 2 years ago.  She does note weight gain and limiting her activity due to the shortness of breath.  Denies chest pain or palpitations.  Denies edema.     There are no preventive care reminders to display for this patient.  Past Medical History:  Diagnosis Date   AKI (acute kidney injury) (HCC) 12/19/2014   Eczema    Esophageal reflux    Gastroesophageal reflux disease 03/09/2022   GERD (gastroesophageal reflux disease)    Gout    HTN (hypertension) 12/19/2014   Hyperlipidemia    Hypertension    Hypokalemia 12/19/2014   Osteopenia    Thoracic degenerative disc disease 12/08/2017   Per XR   Tobacco abuse    pt denies smoking cigarettes currently or as history, marijuana use only    Past Surgical History:  Procedure Laterality Date   COLONOSCOPY     2017   FOOT SURGERY     POLYPECTOMY     2017 TA x 1   TUBAL LIGATION      Family History  Problem Relation Age of Onset   Diabetes Mother    Heart attack Mother    Heart disease Mother 84       MI   Hyperlipidemia Mother    Heart disease Father 57       MI   Hypertension Brother    Alzheimer's disease Maternal Grandmother     Hypertension Daughter    Hypertension Son    Colon cancer Neg Hx    Colon polyps Neg Hx    Esophageal cancer Neg Hx    Rectal cancer Neg Hx    Stomach cancer Neg Hx    Breast cancer Neg Hx    Tremor Neg Hx    Parkinson's disease Neg Hx     Social History   Socioeconomic History   Marital status: Married    Spouse name: Elspeth   Number of children: 2   Years of education: Not on file   Highest education level: Not on file  Occupational History   Occupation: RETIRED  Tobacco Use   Smoking status: Former    Current packs/day: 0.00    Types: Cigarettes    Quit date: 01/14/2016    Years since quitting: 7.8   Smokeless tobacco: Never   Tobacco comments:    Smokes Marijuana -since age 35  Vaping Use   Vaping status: Never Used  Substance and Sexual Activity   Alcohol use: Not Currently    Alcohol/week: 6.0 standard drinks of alcohol    Types: 6 Glasses of wine per week  Comment: stopped alcohol   Drug use: Not Currently    Types: Marijuana    Comment: marijuana---does not smoke cigarettes   Sexual activity: Not Currently    Partners: Male    Comment: 2 partners   Other Topics Concern   Not on file  Social History Narrative   Lives with husband   Social Drivers of Health   Financial Resource Strain: Low Risk  (12/01/2023)   Overall Financial Resource Strain (CARDIA)    Difficulty of Paying Living Expenses: Not very hard  Food Insecurity: No Food Insecurity (12/01/2023)   Hunger Vital Sign    Worried About Running Out of Food in the Last Year: Never true    Ran Out of Food in the Last Year: Never true  Transportation Needs: No Transportation Needs (12/01/2023)   PRAPARE - Administrator, Civil Service (Medical): No    Lack of Transportation (Non-Medical): No  Physical Activity: Inactive (12/01/2023)   Exercise Vital Sign    Days of Exercise per Week: 0 days    Minutes of Exercise per Session: 0 min  Stress: No Stress Concern Present (12/01/2023)   [employer] of Occupational [employer] - Occupational Stress Questionnaire    Feeling of Stress : Not at all  Social Connections: Socially Integrated (12/01/2023)   Social Connection and Isolation Panel [NHANES]    Frequency of Communication with Friends and Family: More than three times a week    Frequency of Social Gatherings with Friends and Family: Once a week    Attends Religious Services: More than 4 times per year    Active Member of [employer] or Organizations: Yes    Attends Banker Meetings: Never    Marital Status: Married  Catering Manager Violence: Not At Risk (12/01/2023)   Humiliation, Afraid, Rape, and Kick questionnaire    Fear of Current or Ex-Partner: No    Emotionally Abused: No    Physically Abused: No    Sexually Abused: No    Outpatient Medications Prior to Visit  Medication Sig Dispense Refill   allopurinol  (ZYLOPRIM ) 100 MG tablet Take 1 tablet (100 mg total) by mouth daily. 90 tablet 1   amLODipine  (NORVASC ) 10 MG tablet Take 1 tablet (10 mg total) by mouth daily. 90 tablet 1   aspirin  EC 81 MG EC tablet Take 1 tablet (81 mg total) by mouth daily.     atorvastatin  (LIPITOR) 10 MG tablet Take 1 tablet (10 mg total) by mouth daily. 90 tablet 2   Cholecalciferol (VITAMIN D ) 125 MCG (5000 UT) CAPS Take by mouth.     clobetasol  cream (TEMOVATE ) 0.05 % Apply topically 2 (two) times daily. (Patient taking differently: Apply topically as needed.) 30 g 1   esomeprazole  (NEXIUM ) 40 MG capsule Take 1 capsule (40 mg total) by mouth daily. (Patient taking differently: Take 40 mg by mouth as needed.) 90 capsule 2   multivitamin-iron-minerals-folic acid (CENTRUM) chewable tablet Chew 1 tablet by mouth daily.     lisinopril  (ZESTRIL ) 5 MG tablet TAKE 1 TABLET(5 MG) BY MOUTH DAILY 90 tablet 0   No facility-administered medications prior to visit.    No Known Allergies  Review of Systems  Constitutional:  Negative for chills, fever and malaise/fatigue.  Respiratory:   Positive for shortness of breath and wheezing. Negative for cough.   Cardiovascular:  Negative for chest pain, palpitations and leg swelling.  Gastrointestinal:  Negative for abdominal pain, constipation, diarrhea, nausea and vomiting.  Genitourinary:  Negative for  dysuria, frequency and urgency.  Musculoskeletal:  Negative for falls.  Neurological:  Negative for dizziness, focal weakness and headaches.  Psychiatric/Behavioral:  Negative for depression and substance abuse. The patient is not nervous/anxious.        Objective:    Physical Exam Constitutional:      General: She is not in acute distress.    Appearance: She is not ill-appearing.  HENT:     Mouth/Throat:     Mouth: Mucous membranes are moist.     Pharynx: Oropharynx is clear.  Eyes:     Extraocular Movements: Extraocular movements intact.     Conjunctiva/sclera: Conjunctivae normal.  Cardiovascular:     Rate and Rhythm: Normal rate and regular rhythm.  Pulmonary:     Effort: Pulmonary effort is normal.     Breath sounds: Normal breath sounds. No rhonchi or rales.  Musculoskeletal:        General: Normal range of motion.     Cervical back: Normal range of motion and neck supple.     Right lower leg: No edema.     Left lower leg: No edema.  Skin:    General: Skin is warm and dry.  Neurological:     General: No focal deficit present.     Mental Status: She is alert and oriented to person, place, and time.     Motor: No weakness.     Coordination: Coordination normal.     Gait: Gait normal.  Psychiatric:        Mood and Affect: Mood normal.        Behavior: Behavior normal.        Thought Content: Thought content normal.      BP 120/80 (BP Location: Left Arm, Patient Position: Sitting, Cuff Size: Large)   Pulse 89   Temp 97.6 F (36.4 C) (Temporal)   Ht 5' 5.25 (1.657 m)   Wt 224 lb (101.6 kg)   LMP 04/26/2008   SpO2 98%   BMI 36.99 kg/m  Wt Readings from Last 3 Encounters:  12/01/23 224 lb (101.6  kg)  12/01/23 224 lb 12.8 oz (102 kg)  08/29/23 225 lb (102.1 kg)       Assessment & Plan:   Problem List Items Addressed This Visit     Chronic gout   Decreased GFR   Relevant Medications   lisinopril  (ZESTRIL ) 5 MG tablet   DOE (dyspnea on exertion) - Primary   Relevant Orders   Ambulatory referral to Pulmonology   Comprehensive metabolic panel (Completed)   CBC (Completed)   DG Chest 2 View (Completed)   Former smoker   Relevant Orders   Ambulatory referral to Pulmonology   Osteopenia   Primary hypertension   Relevant Medications   lisinopril  (ZESTRIL ) 5 MG tablet   Other Relevant Orders   Comprehensive metabolic panel (Completed)   CBC (Completed)   Other Visit Diagnoses       Hypoxia       Relevant Orders   DG Chest 2 View (Completed)      Labs and chest x-ray ordered.  No acute distress. Ambulated patient in office and oxygen level dropped to 90% and pulse increased to 117.  Complained of dyspnea.  She did not have chest pain or dizziness. Negative cardiac workup Referral to [facility] pulmonology for further evaluation including PFTs.  Former marijuana smoker and not tobacco but stopped 2 years ago.   I am having Becky POUR. Lara maintain her aspirin  EC, multivitamin-iron-minerals-folic acid, Vitamin  D, clobetasol  cream, esomeprazole , amLODipine , allopurinol , atorvastatin , and lisinopril .  Meds ordered this encounter  Medications   lisinopril  (ZESTRIL ) 5 MG tablet    Sig: TAKE 1 TABLET(5 MG) BY MOUTH DAILY    Dispense:  90 tablet    Refill:  1

## 2023-12-01 NOTE — Patient Instructions (Signed)
 Please go downstairs for labs and a chest x-ray before you leave.  I referred you to [facility] Pulmonology and they should call you to schedule a visit.  Let me know if it has been more than 2 weeks and you have not heard from them.

## 2023-12-01 NOTE — Progress Notes (Signed)
 Subjective:   Becky Lara is a 68 y.o. female who presents for Medicare Annual (Subsequent) preventive examination.  Visit Complete: In person    Cardiac Risk Factors include: hypertension;advanced age (>37men, >27 women);dyslipidemia;obesity (BMI >30kg/m2);Other (see comment), Risk factor comments: Parkinsonism     Objective:    Today's Vitals   12/01/23 0814  BP: 120/80  Pulse: 89  SpO2: 98%  Weight: 224 lb 12.8 oz (102 kg)  Height: 5' 5.25 (1.657 m)   Body mass index is 37.12 kg/m.     12/01/2023    8:18 AM 11/02/2022   10:06 AM 02/12/2020   11:25 AM 01/26/2016    8:48 AM 12/18/2014   10:15 PM  Advanced Directives  Does Patient Have a Medical Advance Directive? Yes Yes No No No  Type of Estate Agent of [place];Living will Healthcare Power of [place];Living will     Does patient want to make changes to medical advance directive? No - Patient declined No - Patient declined     Copy of Healthcare Power of Attorney in Chart? Yes - validated most recent copy scanned in chart (See row information) Yes - validated most recent copy scanned in chart (See row information)     Would patient like information on creating a medical advance directive?   No - Patient declined No - patient declined information     Current Medications (verified) Outpatient Encounter Medications as of 12/01/2023  Medication Sig   allopurinol  (ZYLOPRIM ) 100 MG tablet Take 1 tablet (100 mg total) by mouth daily.   amLODipine  (NORVASC ) 10 MG tablet Take 1 tablet (10 mg total) by mouth daily.   aspirin  EC 81 MG EC tablet Take 1 tablet (81 mg total) by mouth daily.   atorvastatin  (LIPITOR) 10 MG tablet Take 1 tablet (10 mg total) by mouth daily.   Cholecalciferol (VITAMIN D ) 125 MCG (5000 UT) CAPS Take by mouth.   clobetasol  cream (TEMOVATE ) 0.05 % Apply topically 2 (two) times daily. (Patient taking differently: Apply topically as needed.)   esomeprazole  (NEXIUM ) 40 MG capsule Take  1 capsule (40 mg total) by mouth daily. (Patient taking differently: Take 40 mg by mouth as needed.)   lisinopril  (ZESTRIL ) 5 MG tablet TAKE 1 TABLET(5 MG) BY MOUTH DAILY   multivitamin-iron-minerals-folic acid (CENTRUM) chewable tablet Chew 1 tablet by mouth daily.   No facility-administered encounter medications on file as of 12/01/2023.    Allergies (verified) Patient has no known allergies.   History: Past Medical History:  Diagnosis Date   AKI (acute kidney injury) (HCC) 12/19/2014   Eczema    Esophageal reflux    Gastroesophageal reflux disease 03/09/2022   GERD (gastroesophageal reflux disease)    Gout    HTN (hypertension) 12/19/2014   Hyperlipidemia    Hypertension    Hypokalemia 12/19/2014   Osteopenia    Thoracic degenerative disc disease 12/08/2017   Per XR   Tobacco abuse    pt denies smoking cigarettes currently or as history, marijuana use only   Past Surgical History:  Procedure Laterality Date   COLONOSCOPY     2017   FOOT SURGERY     POLYPECTOMY     2017 TA x 1   TUBAL LIGATION     Family History  Problem Relation Age of Onset   Diabetes Mother    Heart attack Mother    Heart disease Mother 44       MI   Hyperlipidemia Mother    Heart disease  Father 26       MI   Hypertension Brother    Alzheimer's disease Maternal Grandmother    Hypertension Daughter    Hypertension Son    Colon cancer Neg Hx    Colon polyps Neg Hx    Esophageal cancer Neg Hx    Rectal cancer Neg Hx    Stomach cancer Neg Hx    Breast cancer Neg Hx    Tremor Neg Hx    Parkinson's disease Neg Hx    Social History   Socioeconomic History   Marital status: Married    Spouse name: Elspeth   Number of children: 2   Years of education: Not on file   Highest education level: Not on file  Occupational History   Occupation: RETIRED  Tobacco Use   Smoking status: Former    Current packs/day: 0.00    Types: Cigarettes    Quit date: 01/14/2016    Years since quitting: 7.8    Smokeless tobacco: Never   Tobacco comments:    Smokes Marijuana -since age 86  Vaping Use   Vaping status: Never Used  Substance and Sexual Activity   Alcohol use: Not Currently    Alcohol/week: 6.0 standard drinks of alcohol    Types: 6 Glasses of wine per week    Comment: stopped alcohol   Drug use: Not Currently    Types: Marijuana    Comment: marijuana---does not smoke cigarettes   Sexual activity: Not Currently    Partners: Male    Comment: 2 partners   Other Topics Concern   Not on file  Social History Narrative   Lives with husband   Social Drivers of Health   Financial Resource Strain: Low Risk  (12/01/2023)   Overall Financial Resource Strain (CARDIA)    Difficulty of Paying Living Expenses: Not very hard  Food Insecurity: No Food Insecurity (12/01/2023)   Hunger Vital Sign    Worried About Running Out of Food in the Last Year: Never true    Ran Out of Food in the Last Year: Never true  Transportation Needs: No Transportation Needs (12/01/2023)   PRAPARE - Administrator, Civil Service (Medical): No    Lack of Transportation (Non-Medical): No  Physical Activity: Inactive (12/01/2023)   Exercise Vital Sign    Days of Exercise per Week: 0 days    Minutes of Exercise per Session: 0 min  Stress: No Stress Concern Present (12/01/2023)   [employer] of Occupational Health - Occupational Stress Questionnaire    Feeling of Stress : Not at all  Social Connections: Socially Integrated (12/01/2023)   Social Connection and Isolation Panel [NHANES]    Frequency of Communication with Friends and Family: More than three times a week    Frequency of Social Gatherings with Friends and Family: Once a week    Attends Religious Services: More than 4 times per year    Active Member of [employer] or Organizations: Yes    Attends Banker Meetings: Never    Marital Status: Married    Tobacco Counseling Counseling given: Not Answered Tobacco comments: Smokes  Marijuana -since age 66   Clinical Intake:  Pre-visit preparation completed: Yes  Pain : No/denies pain     BMI - recorded: 37.12 Nutritional Status: BMI > 30  Obese Nutritional Risks: None Diabetes: No  How often do you need to have someone help you when you read instructions, pamphlets, or other written materials from your doctor or pharmacy?:  1 - Never  Interpreter Needed?: No  Information entered by :: Gaylia Kassel, RMA   Activities of Daily Living    12/01/2023    8:00 AM  In your present state of health, do you have any difficulty performing the following activities:  Hearing? 0  Comment sometimes ringing in her ears  Vision? 0  Difficulty concentrating or making decisions? 0  Walking or climbing stairs? 0  Dressing or bathing? 0  Doing errands, shopping? 0  Preparing Food and eating ? N  Using the Toilet? N  In the past six months, have you accidently leaked urine? N  Do you have problems with loss of bowel control? N  Managing your Medications? N  Managing your Finances? N  Housekeeping or managing your Housekeeping? N    Patient Care Team: Lendia Boby CROME, NP-C as PCP - General (Family Medicine) Dann Candyce RAMAN, MD as PCP - Cardiology (Cardiology) Jama No (Cardiology)  Indicate any recent Medical Services you may have received from other than [facility] providers in the past year (date may be approximate).     Assessment:   This is a routine wellness examination for [place].  Hearing/Vision screen No results found.   Goals Addressed             This Visit's Progress    DIET - INCREASE WATER INTAKE       Increase water intake to 8 glasses of water per day (64 oz) > still working on it-2025      Depression Screen    12/01/2023    8:20 AM 06/16/2023    8:00 AM 05/03/2023    8:03 AM 11/02/2022    9:46 AM 10/05/2022    8:10 AM 03/09/2022    9:12 AM 02/13/2021    9:06 AM  PHQ 2/9 Scores  PHQ - 2 Score 0 0 0 0 0 0 0  PHQ- 9 Score 0           Fall Risk    12/01/2023    8:18 AM 07/28/2023    8:17 AM 05/03/2023    8:03 AM 11/02/2022    9:46 AM 10/05/2022    8:10 AM  Fall Risk   Falls in the past year? 0 0 0 0 0  Number falls in past yr: 0 0 0 0 0  Injury with Fall? 0 0 0 0 0  Risk for fall due to : No Fall Risks No Fall Risks No Fall Risks No Fall Risks No Fall Risks  Follow up Falls prevention discussed;Falls evaluation completed Falls evaluation completed Falls evaluation completed Falls prevention discussed Falls evaluation completed    MEDICARE RISK AT HOME: Medicare Risk at Home Any stairs in or around the home?: No Home free of loose throw rugs in walkways, pet beds, electrical cords, etc?: Yes Adequate lighting in your home to reduce risk of falls?: Yes Life alert?: No Use of a cane, walker or w/c?: No Grab bars in the bathroom?: No Shower chair or bench in shower?: No Elevated toilet seat or a handicapped toilet?: No  TIMED UP AND GO:  Was the test performed?  Yes  Length of time to ambulate 10 feet: 12 sec Gait steady and fast without use of assistive device    Cognitive Function:        12/01/2023    8:01 AM 11/02/2022   10:09 AM  6CIT Screen  What Year? 0 points 0 points  What month? 0 points 0 points  What  time? 0 points 0 points  Count back from 20 0 points 0 points  Months in reverse 0 points 0 points  Repeat phrase 0 points 0 points  Total Score 0 points 0 points    Immunizations Immunization History  Administered Date(s) Administered   Fluad Quad(high Dose 65+) 07/17/2020, 08/06/2021, 07/02/2022   Fluad Trivalent(High Dose 65+) 07/28/2023   Hepatitis A, Adult 07/04/2018, 02/20/2019   Influenza,inj,Quad PF,6+ Mos 12/02/2015, 08/18/2017, 07/04/2018, 07/09/2019   PFIZER(Purple Top)SARS-COV-2 Vaccination 12/27/2019, 01/23/2020, 08/20/2020   Pfizer Covid-19 Vaccine Bivalent Booster 34yrs & up 08/06/2021   Pfizer(Comirnaty)Fall Seasonal Vaccine 12 years and older 08/11/2022   Pneumococcal  Conjugate-13 07/17/2020   Pneumococcal Polysaccharide-23 12/20/2014   Tdap 12/02/2015   Zoster Recombinant(Shingrix ) 07/05/2018, 02/20/2019    TDAP status: Up to date  Flu Vaccine status: Up to date  Pneumococcal vaccine status: Due, Education has been provided regarding the importance of this vaccine. Advised may receive this vaccine at local pharmacy or Health Dept. Aware to provide a copy of the vaccination record if obtained from local pharmacy or Health Dept. Verbalized acceptance and understanding.  Covid-19 vaccine status: Declined, Education has been provided regarding the importance of this vaccine but patient still declined. Advised may receive this vaccine at local pharmacy or Health Dept.or vaccine clinic. Aware to provide a copy of the vaccination record if obtained from local pharmacy or Health Dept. Verbalized acceptance and understanding.  Qualifies for Shingles Vaccine? Yes   Zostavax completed Yes   Shingrix  Completed?: Yes  Screening Tests Health Maintenance  Topic Date Due   Pneumonia Vaccine 27+ Years old (3 of 3 - PPSV23 or PCV20) 07/17/2021   COVID-19 Vaccine (6 - 2024-25 season) 06/26/2023   Medicare Annual Wellness (AWV)  11/30/2024   MAMMOGRAM  07/17/2025   DTaP/Tdap/Td (2 - Td or Tdap) 12/01/2025   Colonoscopy  05/21/2026   INFLUENZA VACCINE  Completed   DEXA SCAN  Completed   Hepatitis C Screening  Completed   Zoster Vaccines- Shingrix   Completed   HPV VACCINES  Aged Out    Health Maintenance  Health Maintenance Due  Topic Date Due   Pneumonia Vaccine 28+ Years old (3 of 3 - PPSV23 or PCV20) 07/17/2021   COVID-19 Vaccine (6 - 2024-25 season) 06/26/2023    Colorectal cancer screening: Type of screening: Colonoscopy. Completed 05/21/2021. Repeat every 5 years  Mammogram status: Completed 07/18/2023. Repeat every year  Bone Density status: Completed 08/20/2021. Results reflect: Bone density results: OSTEOPENIA. Repeat every 2 years.  Patient is  scheduled for April 2025  Lung Cancer Screening: (Low Dose CT Chest recommended if Age 40-80 years, 20 pack-year currently smoking OR have quit w/in 15years.) does not qualify.   Lung Cancer Screening Referral: N/A  Additional Screening:  Hepatitis C Screening: does qualify; Completed 07/09/2019  Vision Screening: Recommended annual ophthalmology exams for early detection of glaucoma and other disorders of the eye. Is the patient up to date with their annual eye exam?  No  Who is the provider or what is the name of the office in which the patient attends annual eye exams? NEED REferral If pt is not established with a provider, would they like to be referred to a provider to establish care? Yes .   Dental Screening: Recommended annual dental exams for proper oral hygiene    Community Resource Referral / Chronic Care Management: CRR required this visit?  No   CCM required this visit?  No     Plan:  I have personally reviewed and noted the following in the patient's chart:   Medical and social history Use of alcohol, tobacco or illicit drugs  Current medications and supplements including opioid prescriptions. Patient is not currently taking opioid prescriptions. Functional ability and status Nutritional status Physical activity Advanced directives List of other physicians Hospitalizations, surgeries, and ER visits in previous 12 months Vitals Screenings to include cognitive, depression, and falls Referrals and appointments  In addition, I have reviewed and discussed with patient certain preventive protocols, quality metrics, and best practice recommendations. A written personalized care plan for preventive services as well as general preventive health recommendations were provided to patient.     Britnie Colville L Rifky Lapre, CMA   12/01/2023   After Visit Summary: (MyChart) Due to this being a telephonic visit, the after visit summary with patients personalized plan was offered to  patient via MyChart   Nurse Notes: Patient is due for a Pneumonia vaccine (PCV20).  She is up to date on all other health maintenance.  Patient is scheduled for a DEXA in April 2025.  She would like a referral to see ophthalmology.  Patient had no other concerns to address today.

## 2023-12-01 NOTE — Patient Instructions (Addendum)
 Ms. Becky Lara , Thank you for taking time to come for your Medicare Wellness Visit. I appreciate your ongoing commitment to your health goals. Please review the following plan we discussed and let me know if I can assist you in the future.   Referrals/Orders/Follow-Ups/Clinician Recommendations: It was nice to meet you today.  You are due for a Pneumonia vaccine.  Remember to ask Orie about a referral to see an eye doctor.  Aim for 30 minutes of exercise or brisk walking, 6-8 glasses of water, and 5 servings of fruits and vegetables each day.    This is a list of the screening recommended for you and due dates:  Health Maintenance  Topic Date Due   Pneumonia Vaccine (3 of 3 - PPSV23 or PCV20) 07/17/2021   COVID-19 Vaccine (6 - 2024-25 season) 06/26/2023   Medicare Annual Wellness Visit  11/03/2023   Mammogram  07/17/2025   DTaP/Tdap/Td vaccine (2 - Td or Tdap) 12/01/2025   Colon Cancer Screening  05/21/2026   Flu Shot  Completed   DEXA scan (bone density measurement)  Completed   Hepatitis C Screening  Completed   Zoster (Shingles) Vaccine  Completed   HPV Vaccine  Aged Out    Advanced directives: (In Chart) A copy of your advanced directives are scanned into your chart should your provider ever need it.  Next Medicare Annual Wellness Visit scheduled for next year: Yes

## 2023-12-14 ENCOUNTER — Ambulatory Visit: Payer: Medicare Other | Admitting: Family Medicine

## 2024-01-18 ENCOUNTER — Encounter: Payer: Self-pay | Admitting: Pulmonary Disease

## 2024-01-18 ENCOUNTER — Other Ambulatory Visit: Payer: Self-pay | Admitting: Family Medicine

## 2024-01-18 ENCOUNTER — Ambulatory Visit (INDEPENDENT_AMBULATORY_CARE_PROVIDER_SITE_OTHER): Payer: Medicare Other | Admitting: Pulmonary Disease

## 2024-01-18 VITALS — BP 113/80 | HR 96 | Temp 97.8°F | Ht 65.25 in | Wt 216.0 lb

## 2024-01-18 DIAGNOSIS — R0602 Shortness of breath: Secondary | ICD-10-CM

## 2024-01-18 NOTE — Progress Notes (Signed)
 Becky Lara    [id number]    [birth year]  Primary Care Physician:Henson, Zorita Pang, NP-C  Referring Physician: Avanell Shackleton, NP-C [address] [place],  [place] [address]  Chief complaint:   Patient being seen for shortness of breath  HPI:  Patient with shortness of breath for the last year Unable to walk a quarter of a mile without getting short of breath  Has no chronic cough  Never smoker but did smoke marijuana regularly up until 2022  No history of asthma  Worked with some kind of machinery, uses a mask  At that did smoke Does not recollect having respiratory infections when she was growing up  No chest pains or chest discomfort  Did remember episode of shortness of breath last year that led to a CT being obtained, concern at the time was for blood clots which was negative, CT scan did show some atelectasis at the bases    Outpatient Encounter Medications as of 01/18/2024  Medication Sig   allopurinol (ZYLOPRIM) 100 MG tablet Take 1 tablet (100 mg total) by mouth daily.   amLODipine (NORVASC) 10 MG tablet Take 1 tablet (10 mg total) by mouth daily.   aspirin EC 81 MG EC tablet Take 1 tablet (81 mg total) by mouth daily.   atorvastatin (LIPITOR) 10 MG tablet Take 1 tablet (10 mg total) by mouth daily.   Cholecalciferol (VITAMIN D) 125 MCG (5000 UT) CAPS Take by mouth.   clobetasol cream (TEMOVATE) 0.05 % Apply topically 2 (two) times daily. (Patient taking differently: Apply topically as needed.)   esomeprazole (NEXIUM) 40 MG capsule TAKE 1 CAPSULE(40 MG) BY MOUTH DAILY   lisinopril (ZESTRIL) 5 MG tablet TAKE 1 TABLET(5 MG) BY MOUTH DAILY   multivitamin-iron-minerals-folic acid (CENTRUM) chewable tablet Chew 1 tablet by mouth daily.   No facility-administered encounter medications on file as of 01/18/2024.    Allergies as of 01/18/2024   (No Known Allergies)    Past Medical History:  Diagnosis Date   AKI (acute kidney injury) (HCC)  12/19/2014   Eczema    Esophageal reflux    Gastroesophageal reflux disease 03/09/2022   GERD (gastroesophageal reflux disease)    Gout    HTN (hypertension) 12/19/2014   Hyperlipidemia    Hypertension    Hypokalemia 12/19/2014   Osteopenia    Thoracic degenerative disc disease 12/08/2017   Per XR   Tobacco abuse    pt denies smoking cigarettes currently or as history, marijuana use only    Past Surgical History:  Procedure Laterality Date   COLONOSCOPY     2017   FOOT SURGERY     POLYPECTOMY     2017 TA x 1   TUBAL LIGATION      Family History  Problem Relation Age of Onset   Diabetes Mother    Heart attack Mother    Heart disease Mother 64       MI   Hyperlipidemia Mother    Heart disease Father 29       MI   Hypertension Brother    Alzheimer's disease Maternal Grandmother    Hypertension Daughter    Hypertension Son    Colon cancer Neg Hx    Colon polyps Neg Hx    Esophageal cancer Neg Hx    Rectal cancer Neg Hx    Stomach cancer Neg Hx    Breast cancer Neg Hx    Tremor Neg Hx  Parkinson's disease Neg Hx     Social History   Socioeconomic History   Marital status: Married    Spouse name: Viviann Spare   Number of children: 2   Years of education: Not on file   Highest education level: Not on file  Occupational History   Occupation: RETIRED  Tobacco Use   Smoking status: Former    Current packs/day: 0.00    Types: Cigarettes    Quit date: 01/14/2016    Years since quitting: 8.0   Smokeless tobacco: Never   Tobacco comments:    Smokes Marijuana -since age 36  Vaping Use   Vaping status: Never Used  Substance and Sexual Activity   Alcohol use: Not Currently    Alcohol/week: 6.0 standard drinks of alcohol    Types: 6 Glasses of wine per week    Comment: stopped alcohol   Drug use: Not Currently    Types: Marijuana    Comment: marijuana---does not smoke cigarettes   Sexual activity: Not Currently    Partners: Male    Comment: 2 partners   Other  Topics Concern   Not on file  Social History Narrative   Lives with husband   Social Drivers of Health   Financial Resource Strain: Low Risk  (12/01/2023)   Overall Financial Resource Strain (CARDIA)    Difficulty of Paying Living Expenses: Not very hard  Food Insecurity: No Food Insecurity (12/01/2023)   Hunger Vital Sign    Worried About Running Out of Food in the Last Year: Never true    Ran Out of Food in the Last Year: Never true  Transportation Needs: No Transportation Needs (12/01/2023)   PRAPARE - Administrator, Civil Service (Medical): No    Lack of Transportation (Non-Medical): No  Physical Activity: Inactive (12/01/2023)   Exercise Vital Sign    Days of Exercise per Week: 0 days    Minutes of Exercise per Session: 0 min  Stress: No Stress Concern Present (12/01/2023)   [employer] of Occupational Health - Occupational Stress Questionnaire    Feeling of Stress : Not at all  Social Connections: Socially Integrated (12/01/2023)   Social Connection and Isolation Panel [NHANES]    Frequency of Communication with Friends and Family: More than three times a week    Frequency of Social Gatherings with Friends and Family: Once a week    Attends Religious Services: More than 4 times per year    Active Member of [employer] or Organizations: Yes    Attends Banker Meetings: Never    Marital Status: Married  Catering manager Violence: Not At Risk (12/01/2023)   Humiliation, Afraid, Rape, and Kick questionnaire    Fear of Current or Ex-Partner: No    Emotionally Abused: No    Physically Abused: No    Sexually Abused: No    Review of Systems  Respiratory:  Positive for shortness of breath.   Psychiatric/Behavioral:  Negative for sleep disturbance.     Vitals:   01/18/24 1412  BP: 113/80  Pulse: 96  Temp: 97.8 F (36.6 C)  SpO2: 97%     Physical Exam Constitutional:      Appearance: She is obese.  HENT:     Head: Normocephalic.     Mouth/Throat:      Mouth: Mucous membranes are moist.  Eyes:     General: No scleral icterus. Cardiovascular:     Rate and Rhythm: Normal rate and regular rhythm.     Heart  sounds: No murmur heard.    No friction rub.  Pulmonary:     Effort: No respiratory distress.     Breath sounds: No stridor. No wheezing or rhonchi.  Musculoskeletal:     Cervical back: No rigidity or tenderness.  Neurological:     Mental Status: She is alert.  Psychiatric:        Mood and Affect: Mood normal.    Data Reviewed: CT from June 2024 reviewed showing no PE, some atelectasis  Most recent echo does show diastolic dysfunction, normal ejection fraction  Assessment:  Shortness of breath on exertion  No underlying lung disease  No significant musculoskeletal pain or discomfort Echocardiogram with diastolic dysfunction no other significant abnormality  Deconditioning may be playing a role  Plan/Recommendations: Obtain pulmonary function test  Graded activities as tolerated was suggested to patient  Use of inspiratory muscle trainers may also be considered-IMT PEP device  Follow-up in 3 months  Will consider repeating CT/bronchodilators depending on pulmonary function test findings  Encouraged to call with significant concerns   Virl Diamond MD [facility] 01/18/2024, 2:54 PM  CC: Avanell Shackleton, NP-C

## 2024-01-18 NOTE — Patient Instructions (Addendum)
 Will see you in about 3 months  I did place an order for that breathing study, if there is any abnormality on the breathing study that may guide [place] with respect to using inhalers or repeating the CT scan of the chest  Start graded activities as tolerated  The device I was shown you online is called IMT/PEP-Bigbreathe -it is a muscle exerciser for the breathing muscles

## 2024-02-06 ENCOUNTER — Ambulatory Visit: Payer: Medicare Other | Admitting: Family Medicine

## 2024-02-20 ENCOUNTER — Encounter: Payer: Self-pay | Admitting: Family Medicine

## 2024-02-20 ENCOUNTER — Ambulatory Visit
Admission: RE | Admit: 2024-02-20 | Discharge: 2024-02-20 | Disposition: A | Discharge status: Home / Self Care | Payer: Medicare Other | Source: Ambulatory Visit | Attending: Family Medicine | Admitting: Family Medicine

## 2024-02-20 DIAGNOSIS — M8588 Other specified disorders of bone density and structure, other site: Secondary | ICD-10-CM | POA: Diagnosis not present

## 2024-02-20 DIAGNOSIS — M8580 Other specified disorders of bone density and structure, unspecified site: Secondary | ICD-10-CM

## 2024-02-20 DIAGNOSIS — E2839 Other primary ovarian failure: Secondary | ICD-10-CM

## 2024-02-20 DIAGNOSIS — N958 Other specified menopausal and perimenopausal disorders: Secondary | ICD-10-CM | POA: Diagnosis not present

## 2024-02-28 ENCOUNTER — Encounter: Payer: Self-pay | Admitting: Family Medicine

## 2024-02-28 ENCOUNTER — Ambulatory Visit: Payer: Medicare Other | Admitting: Family Medicine

## 2024-02-28 VITALS — BP 108/74 | HR 90 | Temp 97.8°F | Ht 65.25 in | Wt 219.0 lb

## 2024-02-28 DIAGNOSIS — R944 Abnormal results of kidney function studies: Secondary | ICD-10-CM

## 2024-02-28 DIAGNOSIS — E782 Mixed hyperlipidemia: Secondary | ICD-10-CM

## 2024-02-28 DIAGNOSIS — M8580 Other specified disorders of bone density and structure, unspecified site: Secondary | ICD-10-CM | POA: Diagnosis not present

## 2024-02-28 DIAGNOSIS — I10 Essential (primary) hypertension: Secondary | ICD-10-CM | POA: Diagnosis not present

## 2024-02-28 DIAGNOSIS — E559 Vitamin D deficiency, unspecified: Secondary | ICD-10-CM

## 2024-02-28 DIAGNOSIS — M1A00X0 Idiopathic chronic gout, unspecified site, without tophus (tophi): Secondary | ICD-10-CM | POA: Diagnosis not present

## 2024-02-28 LAB — COMPREHENSIVE METABOLIC PANEL WITH GFR
ALT: 11 U/L (ref 0–35)
AST: 15 U/L (ref 0–37)
Albumin: 4.2 g/dL (ref 3.5–5.2)
Alkaline Phosphatase: 89 U/L (ref 39–117)
BUN: 9 mg/dL (ref 6–23)
CO2: 27 meq/L (ref 19–32)
Calcium: 9.8 mg/dL (ref 8.4–10.5)
Chloride: 106 meq/L (ref 96–112)
Creatinine, Ser: 1.01 mg/dL (ref 0.40–1.20)
GFR: 56.91 mL/min — ABNORMAL LOW (ref >60.00)
Glucose, Bld: 110 mg/dL — ABNORMAL HIGH (ref 70–99)
Potassium: 4.3 meq/L (ref 3.5–5.1)
Sodium: 141 meq/L (ref 135–145)
Total Bilirubin: 0.8 mg/dL (ref 0.2–1.2)
Total Protein: 8 g/dL (ref 6.0–8.3)

## 2024-02-28 LAB — CBC WITH DIFFERENTIAL/PLATELET
Basophils Absolute: 0 10*3/uL (ref 0.0–0.1)
Basophils Relative: 0.5 % (ref 0.0–3.0)
Eosinophils Absolute: 0.4 10*3/uL (ref 0.0–0.7)
Eosinophils Relative: 3.8 % (ref 0.0–5.0)
HCT: 42.4 % (ref 36.0–46.0)
Hemoglobin: 14.1 g/dL (ref 12.0–15.0)
Lymphocytes Relative: 23 % (ref 12.0–46.0)
Lymphs Abs: 2.1 10*3/uL (ref 0.7–4.0)
MCHC: 33.1 g/dL (ref 30.0–36.0)
MCV: 86 fl (ref 78.0–100.0)
Monocytes Absolute: 0.5 10*3/uL (ref 0.1–1.0)
Monocytes Relative: 5.7 % (ref 3.0–12.0)
Neutro Abs: 6.3 10*3/uL (ref 1.4–7.7)
Neutrophils Relative %: 67 % (ref 43.0–77.0)
Platelets: 178 10*3/uL (ref 150.0–400.0)
RBC: 4.94 Mil/uL (ref 3.87–5.11)
RDW: 15 % (ref 11.5–15.5)
WBC: 9.3 10*3/uL (ref 4.0–10.5)

## 2024-02-28 LAB — LIPID PANEL
Cholesterol: 169 mg/dL (ref 0–200)
HDL: 60.2 mg/dL (ref >39.00)
LDL Cholesterol: 96 mg/dL (ref 0–99)
NonHDL: 108.52
Total CHOL/HDL Ratio: 3
Triglycerides: 62 mg/dL (ref 0.0–149.0)
VLDL: 12.4 mg/dL (ref 0.0–40.0)

## 2024-02-28 LAB — VITAMIN D 25 HYDROXY (VIT D DEFICIENCY, FRACTURES): VITD: 46.31 ng/mL (ref 30.00–100.00)

## 2024-02-28 NOTE — Assessment & Plan Note (Signed)
Continue statin therapy and low-fat diet. Check fasting lipids.

## 2024-02-28 NOTE — Assessment & Plan Note (Signed)
Check vitamin D level and follow up 

## 2024-02-28 NOTE — Assessment & Plan Note (Signed)
 Recent DEXA without significant changes. Counseling on getting adequate calcium  1,200 mg and taking vitamin D  1,000 IUs daily. Weight bearing exercise as well.

## 2024-02-28 NOTE — Assessment & Plan Note (Signed)
Controlled. Continue medications and low sodium diet.  Check renal function.

## 2024-02-28 NOTE — Patient Instructions (Signed)
 Please go downstairs for labs before you leave.  Continue your current medications.  Increase your physical activity and if you have severe shortness of breath, dizziness, chest pain then go to the emergency department.  I do not expect this to happen.  See the pulmonologist as recommended next month and in July.  I will see you back in 6 months pending your lab results or sooner if you need me.

## 2024-02-28 NOTE — Assessment & Plan Note (Signed)
 Avoid nephrotoxic substances. Control HTN. Check renal function and follow up.

## 2024-02-28 NOTE — Progress Notes (Signed)
 Subjective:     Patient ID: Becky Lara, female    DOB: 1956-04-13, 68 y.o.   MRN: [id number]  Chief Complaint  Patient presents with   Medical Management of Chronic Issues    3 month f/u    HPI  History of Present Illness         She is here to follow-up on chronic health conditions.  She is fasting.  Reports good compliance with medications for hypertension, hyperlipidemia and gout.  Denies gout flares since her last visit.  She has not needed Nexium .   Her last bone density showed osteopenia and stable.  She is taking a vitamin D  supplement.  She has not been active due to history of shortness of breath with activity.  She is seeing pulmonology and having PFTs done next month.  She has seen cardiology.   There are no preventive care reminders to display for this patient.  Past Medical History:  Diagnosis Date   AKI (acute kidney injury) (HCC) 12/19/2014   Eczema    Esophageal reflux    Gastroesophageal reflux disease 03/09/2022   GERD (gastroesophageal reflux disease)    Gout    HTN (hypertension) 12/19/2014   Hyperlipidemia    Hypertension    Hypokalemia 12/19/2014   Osteopenia    Thoracic degenerative disc disease 12/08/2017   Per XR   Tobacco abuse    pt denies smoking cigarettes currently or as history, marijuana use only    Past Surgical History:  Procedure Laterality Date   COLONOSCOPY     2017   FOOT SURGERY     POLYPECTOMY     2017 TA x 1   TUBAL LIGATION      Family History  Problem Relation Age of Onset   Diabetes Mother    Heart attack Mother    Heart disease Mother 23       MI   Hyperlipidemia Mother    Heart disease Father 75       MI   Hypertension Brother    Alzheimer's disease Maternal Grandmother    Hypertension Daughter    Hypertension Son    Colon cancer Neg Hx    Colon polyps Neg Hx    Esophageal cancer Neg Hx    Rectal cancer Neg Hx    Stomach cancer Neg Hx    Breast cancer Neg Hx    Tremor Neg Hx     Parkinson's disease Neg Hx     Social History   Socioeconomic History   Marital status: Married    Spouse name: Landon Pinion   Number of children: 2   Years of education: Not on file   Highest education level: Not on file  Occupational History   Occupation: RETIRED  Tobacco Use   Smoking status: Former    Current packs/day: 0.00    Types: Cigarettes    Quit date: 01/14/2016    Years since quitting: 8.1   Smokeless tobacco: Never   Tobacco comments:    Smokes Marijuana -since age 29  Vaping Use   Vaping status: Never Used  Substance and Sexual Activity   Alcohol use: Not Currently    Alcohol/week: 6.0 standard drinks of alcohol    Types: 6 Glasses of wine per week    Comment: stopped alcohol   Drug use: Not Currently    Types: Marijuana    Comment: marijuana---does not smoke cigarettes   Sexual activity: Not Currently    Partners: Male    Comment:  2 partners   Other Topics Concern   Not on file  Social History Narrative   Lives with husband   Social Drivers of Health   Financial Resource Strain: Low Risk  (12/01/2023)   Overall Financial Resource Strain (CARDIA)    Difficulty of Paying Living Expenses: Not very hard  Food Insecurity: No Food Insecurity (12/01/2023)   Hunger Vital Sign    Worried About Running Out of Food in the Last Year: Never true    Ran Out of Food in the Last Year: Never true  Transportation Needs: No Transportation Needs (12/01/2023)   PRAPARE - Administrator, Civil Service (Medical): No    Lack of Transportation (Non-Medical): No  Physical Activity: Inactive (12/01/2023)   Exercise Vital Sign    Days of Exercise per Week: 0 days    Minutes of Exercise per Session: 0 min  Stress: No Stress Concern Present (12/01/2023)   [employer] of Occupational Health - Occupational Stress Questionnaire    Feeling of Stress : Not at all  Social Connections: Socially Integrated (12/01/2023)   Social Connection and Isolation Panel [NHANES]     Frequency of Communication with Friends and Family: More than three times a week    Frequency of Social Gatherings with Friends and Family: Once a week    Attends Religious Services: More than 4 times per year    Active Member of [employer] or Organizations: Yes    Attends Banker Meetings: Never    Marital Status: Married  Catering manager Violence: Not At Risk (12/01/2023)   Humiliation, Afraid, Rape, and Kick questionnaire    Fear of Current or Ex-Partner: No    Emotionally Abused: No    Physically Abused: No    Sexually Abused: No    Outpatient Medications Prior to Visit  Medication Sig Dispense Refill   allopurinol  (ZYLOPRIM ) 100 MG tablet Take 1 tablet (100 mg total) by mouth daily. 90 tablet 1   amLODipine  (NORVASC ) 10 MG tablet Take 1 tablet (10 mg total) by mouth daily. 90 tablet 1   aspirin  EC 81 MG EC tablet Take 1 tablet (81 mg total) by mouth daily.     atorvastatin  (LIPITOR) 10 MG tablet Take 1 tablet (10 mg total) by mouth daily. 90 tablet 2   Cholecalciferol (VITAMIN D ) 125 MCG (5000 UT) CAPS Take by mouth.     clobetasol  cream (TEMOVATE ) 0.05 % Apply topically 2 (two) times daily. (Patient taking differently: Apply topically as needed.) 30 g 1   esomeprazole  (NEXIUM ) 40 MG capsule TAKE 1 CAPSULE(40 MG) BY MOUTH DAILY 90 capsule 2   lisinopril  (ZESTRIL ) 5 MG tablet TAKE 1 TABLET(5 MG) BY MOUTH DAILY 90 tablet 1   multivitamin-iron-minerals-folic acid (CENTRUM) chewable tablet Chew 1 tablet by mouth daily.     No facility-administered medications prior to visit.    No Known Allergies  Review of Systems  Constitutional:  Negative for chills, fever, malaise/fatigue and weight loss.  Respiratory:  Negative for cough and shortness of breath.   Cardiovascular:  Negative for chest pain, palpitations and leg swelling.  Gastrointestinal:  Negative for abdominal pain, constipation, diarrhea, nausea and vomiting.  Genitourinary:  Negative for dysuria, frequency and  urgency.  Neurological:  Negative for dizziness, focal weakness and headaches.  Psychiatric/Behavioral:  Negative for depression and substance abuse. The patient is not nervous/anxious.        Objective:    Physical Exam Constitutional:      General: She is  not in acute distress.    Appearance: She is not ill-appearing.  HENT:     Mouth/Throat:     Mouth: Mucous membranes are moist.  Eyes:     Extraocular Movements: Extraocular movements intact.     Conjunctiva/sclera: Conjunctivae normal.  Cardiovascular:     Rate and Rhythm: Normal rate and regular rhythm.  Pulmonary:     Effort: Pulmonary effort is normal.     Breath sounds: Normal breath sounds.  Musculoskeletal:     Cervical back: Normal range of motion and neck supple.     Right lower leg: No edema.     Left lower leg: No edema.  Skin:    General: Skin is warm and dry.  Neurological:     General: No focal deficit present.     Mental Status: She is alert and oriented to person, place, and time.     Motor: No weakness.     Coordination: Coordination normal.     Gait: Gait normal.  Psychiatric:        Mood and Affect: Mood normal.        Behavior: Behavior normal.        Thought Content: Thought content normal.      BP 108/74 (BP Location: Left Arm, Patient Position: Sitting)   Pulse 90   Temp 97.8 F (36.6 C) (Temporal)   Ht 5' 5.25" (1.657 m)   Wt 219 lb (99.3 kg)   LMP 04/26/2008   SpO2 95%   BMI 36.16 kg/m  Wt Readings from Last 3 Encounters:  02/28/24 219 lb (99.3 kg)  01/18/24 216 lb (98 kg)  12/01/23 224 lb (101.6 kg)       Assessment & Plan:   Problem List Items Addressed This Visit     Chronic gout   Continue allopurinol .  No recent gout attacks.  Uric acid level in a good range.       Decreased GFR   Avoid nephrotoxic substances. Control HTN. Check renal function and follow up.       Relevant Orders   Comprehensive metabolic panel with GFR (Completed)   Mixed hyperlipidemia -  Primary   Continue statin therapy and low-fat diet. Check fasting lipids.       Relevant Orders   Lipid panel (Completed)   Osteopenia   Recent DEXA without significant changes. Counseling on getting adequate calcium  1,200 mg and taking vitamin D  1,000 IUs daily. Weight bearing exercise as well.       Primary hypertension   Controlled. Continue medications and low sodium diet.  Check renal function.      Relevant Orders   CBC with Differential/Platelet (Completed)   Comprehensive metabolic panel with GFR (Completed)   Vitamin D  deficiency   Check vitamin D  level and follow up      Relevant Orders   VITAMIN D  25 Hydroxy (Vit-D Deficiency, Fractures) (Completed)    I am having Gretta Leavens. Komar maintain her aspirin  EC, multivitamin-iron-minerals-folic acid, Vitamin D , clobetasol  cream, amLODipine , allopurinol , atorvastatin , lisinopril , and esomeprazole .  No orders of the defined types were placed in this encounter.

## 2024-02-28 NOTE — Assessment & Plan Note (Signed)
Continue allopurinol.  No recent gout attacks.  Uric acid level in a good range.

## 2024-03-28 NOTE — Progress Notes (Unsigned)
 No chief complaint on file.   HISTORY OF PRESENT ILLNESS:  03/28/24 ALL:  [name] returns for follow up for PD. She was last seen by me 08/2023 and doing well. Melatonin advised for concerns of insomnia. Since,   08/29/2023 ALL:  Becky Lara is a 68 y.o. female here today for follow up for PD. She was last seen by Dr Omar Bibber 10/2022 and doing well. She had declined medication management of tremor as she felt it was not bothersome. Since, she reports symptoms are stable. She states tremor is not bothersome. She notes occasional tremor of ace and hand. Seems worse after taking BP meds or if she eats more sugar. No changes in gait. She may stoop a little more. She was going to the gym regularly to do yoga but has not been recently. She denies difficulty swallowing. Appetite is good. Mood is good. No changes or concerns with memory. She sleeps for about 3-4 hours then wakes up. Sometimes it is hard for her to go back to sleep but she usually does for about an hour or so. She usually sleeps about 6 hours total each night. She does watch TV until she falls asleep. She is followed closely by PCP.    HISTORY (copied from Dr Dail Drought previous note)  Becky Lara is a 68 year old right-handed woman with an underlying medical history of reflux disease, eczema, gout, hypertension, hyperlipidemia, hypokalemia, osteopenia, smoking, vitamin D  deficiency, and obesity, who presents for follow-up consultation of her Parkinsonism.  The patient is unaccompanied today.  I last saw her on 04/22/2022, at which time she reported feeling stable.  We talked about her DaTscan  results from 04/16/2022 which indicated decreased radiotracer activity in the posterior right putamen.  We mutually agreed to continue to monitor her symptoms and examination and not start any new medications at the time.   Today, 10/28/2022: She reports feeling stable, no new symptoms, tremor is intermittent and does not bother her, she tries to stay  active and goes to the [facility] 5 or 6 times a week.  She tries to hydrate well.  No new cognitive issues.  She would like to continue to monitor her symptoms and not utilize any medication for parkinsonism.   REVIEW OF SYSTEMS: Out of a complete 14 system review of symptoms, the patient complains only of the following symptoms, bilateral hand and facial tremor and all other reviewed systems are negative.   ALLERGIES: No Known Allergies   HOME MEDICATIONS: Outpatient Medications Prior to Visit  Medication Sig Dispense Refill   allopurinol  (ZYLOPRIM ) 100 MG tablet Take 1 tablet (100 mg total) by mouth daily. 90 tablet 1   amLODipine  (NORVASC ) 10 MG tablet Take 1 tablet (10 mg total) by mouth daily. 90 tablet 1   aspirin  EC 81 MG EC tablet Take 1 tablet (81 mg total) by mouth daily.     atorvastatin  (LIPITOR) 10 MG tablet Take 1 tablet (10 mg total) by mouth daily. 90 tablet 2   Cholecalciferol (VITAMIN D ) 125 MCG (5000 UT) CAPS Take by mouth.     clobetasol  cream (TEMOVATE ) 0.05 % Apply topically 2 (two) times daily. (Patient taking differently: Apply topically as needed.) 30 g 1   esomeprazole  (NEXIUM ) 40 MG capsule TAKE 1 CAPSULE(40 MG) BY MOUTH DAILY 90 capsule 2   lisinopril  (ZESTRIL ) 5 MG tablet TAKE 1 TABLET(5 MG) BY MOUTH DAILY 90 tablet 1   multivitamin-iron-minerals-folic acid (CENTRUM) chewable tablet Chew 1 tablet by mouth daily.  No facility-administered medications prior to visit.     PAST MEDICAL HISTORY: Past Medical History:  Diagnosis Date   AKI (acute kidney injury) (HCC) 12/19/2014   Eczema    Esophageal reflux    Gastroesophageal reflux disease 03/09/2022   GERD (gastroesophageal reflux disease)    Gout    HTN (hypertension) 12/19/2014   Hyperlipidemia    Hypertension    Hypokalemia 12/19/2014   Osteopenia    Thoracic degenerative disc disease 12/08/2017   Per XR   Tobacco abuse    pt denies smoking cigarettes currently or as history, marijuana use only      PAST SURGICAL HISTORY: Past Surgical History:  Procedure Laterality Date   COLONOSCOPY     2017   FOOT SURGERY     POLYPECTOMY     2017 TA x 1   TUBAL LIGATION       FAMILY HISTORY: Family History  Problem Relation Age of Onset   Diabetes Mother    Heart attack Mother    Heart disease Mother 51       MI   Hyperlipidemia Mother    Heart disease Father 27       MI   Hypertension Brother    Alzheimer's disease Maternal Grandmother    Hypertension Daughter    Hypertension Son    Colon cancer Neg Hx    Colon polyps Neg Hx    Esophageal cancer Neg Hx    Rectal cancer Neg Hx    Stomach cancer Neg Hx    Breast cancer Neg Hx    Tremor Neg Hx    Parkinson's disease Neg Hx      SOCIAL HISTORY: Social History   Socioeconomic History   Marital status: Married    Spouse name: Landon Pinion   Number of children: 2   Years of education: Not on file   Highest education level: Not on file  Occupational History   Occupation: RETIRED  Tobacco Use   Smoking status: Former    Current packs/day: 0.00    Types: Cigarettes    Quit date: 01/14/2016    Years since quitting: 8.2   Smokeless tobacco: Never   Tobacco comments:    Smokes Marijuana -since age 52  Vaping Use   Vaping status: Never Used  Substance and Sexual Activity   Alcohol use: Not Currently    Alcohol/week: 6.0 standard drinks of alcohol    Types: 6 Glasses of wine per week    Comment: stopped alcohol   Drug use: Not Currently    Types: Marijuana    Comment: marijuana---does not smoke cigarettes   Sexual activity: Not Currently    Partners: Male    Comment: 2 partners   Other Topics Concern   Not on file  Social History Narrative   Lives with husband   Social Drivers of Health   Financial Resource Strain: Low Risk  (12/01/2023)   Overall Financial Resource Strain (CARDIA)    Difficulty of Paying Living Expenses: Not very hard  Food Insecurity: No Food Insecurity (12/01/2023)   Hunger Vital Sign     Worried About Running Out of Food in the Last Year: Never true    Ran Out of Food in the Last Year: Never true  Transportation Needs: No Transportation Needs (12/01/2023)   PRAPARE - Administrator, Civil Service (Medical): No    Lack of Transportation (Non-Medical): No  Physical Activity: Inactive (12/01/2023)   Exercise Vital Sign    Days of  Exercise per Week: 0 days    Minutes of Exercise per Session: 0 min  Stress: No Stress Concern Present (12/01/2023)   [employer] of Occupational Health - Occupational Stress Questionnaire    Feeling of Stress : Not at all  Social Connections: Socially Integrated (12/01/2023)   Social Connection and Isolation Panel [NHANES]    Frequency of Communication with Friends and Family: More than three times a week    Frequency of Social Gatherings with Friends and Family: Once a week    Attends Religious Services: More than 4 times per year    Active Member of [employer] or Organizations: Yes    Attends Banker Meetings: Never    Marital Status: Married  Catering manager Violence: Not At Risk (12/01/2023)   Humiliation, Afraid, Rape, and Kick questionnaire    Fear of Current or Ex-Partner: No    Emotionally Abused: No    Physically Abused: No    Sexually Abused: No     PHYSICAL EXAM  There were no vitals filed for this visit.  There is no height or weight on file to calculate BMI.  Generalized: Well developed, in no acute distress  Cardiology: normal rate and rhythm, no murmur auscultated  Respiratory: clear to auscultation bilaterally    Neurological examination  Mentation: Alert oriented to time, place, history taking. Follows all commands speech and language fluent Cranial nerve II-XII: Pupils were equal round reactive to light. Extraocular movements were full, visual field were full on confrontational test. Facial sensation and strength were normal. Head turning and shoulder shrug  were normal and symmetric. Motor: The  motor testing reveals 5 over 5 strength of all 4 extremities.No tremor noted with exam, today.  Sensory: Sensory testing is intact to soft touch on all 4 extremities. No evidence of extinction is noted.  Coordination: Cerebellar testing reveals good finger-nose-finger and heel-to-shin bilaterally.  Gait and station: Able to push to standing position without difficulty. Gait is normal. Decreased arm swing L>R Reflexes: Deep tendon reflexes are symmetric and normal bilaterally.    DIAGNOSTIC DATA (LABS, IMAGING, TESTING) - I reviewed patient records, labs, notes, testing and imaging myself where available.  Lab Results  Component Value Date   WBC 9.3 02/28/2024   HGB 14.1 02/28/2024   HCT 42.4 02/28/2024   MCV 86.0 02/28/2024   PLT 178.0 02/28/2024      Component Value Date/Time   NA 141 02/28/2024 0938   NA 139 07/13/2023 1345   K 4.3 02/28/2024 0938   CL 106 02/28/2024 0938   CO2 27 02/28/2024 0938   GLUCOSE 110 (H) 02/28/2024 0938   BUN 9 02/28/2024 0938   BUN 10 07/13/2023 1345   CREATININE 1.01 02/28/2024 0938   CREATININE 1.05 (H) 07/06/2017 0814   CALCIUM  9.8 02/28/2024 0938   PROT 8.0 02/28/2024 0938   PROT 7.5 03/09/2022 1028   ALBUMIN 4.2 02/28/2024 0938   ALBUMIN 4.4 03/09/2022 1028   AST 15 02/28/2024 0938   ALT 11 02/28/2024 0938   ALKPHOS 89 02/28/2024 0938   BILITOT 0.8 02/28/2024 0938   BILITOT 0.8 03/09/2022 1028   GFRNONAA 60 07/17/2020 1011   GFRNONAA 57 (L) 07/06/2017 0814   GFRAA 69 07/17/2020 1011   GFRAA 66 07/06/2017 0814   Lab Results  Component Value Date   CHOL 169 02/28/2024   HDL 60.20 02/28/2024   LDLCALC 96 02/28/2024   TRIG 62.0 02/28/2024   CHOLHDL 3 02/28/2024   Lab Results  Component Value Date  HGBA1C 4.8 07/04/2018   No results found for: "VITAMINB12" Lab Results  Component Value Date   TSH 2.62 05/03/2023        No data to display               No data to display           ASSESSMENT AND PLAN  68  y.o. year old female  has a past medical history of AKI (acute kidney injury) (HCC) (12/19/2014), Eczema, Esophageal reflux, Gastroesophageal reflux disease (03/09/2022), GERD (gastroesophageal reflux disease), Gout, HTN (hypertension) (12/19/2014), Hyperlipidemia, Hypertension, Hypokalemia (12/19/2014), Osteopenia, Thoracic degenerative disc disease (12/08/2017), and Tobacco abuse. here with    No diagnosis found.  Becky Lara is doing well. Tremor is stable. We will continue to monitor for now. Sleep hygiene discussed. May consider trial of melatonin OTC. I have encouraged her to resume regular exercise. Consider PT if she wishes. Healthy lifestyle habits encouraged. She will follow up with PCP as directed. She will return to see me in 6-8 months, sooner if needed. She verbalizes understanding and agreement with this plan.    No orders of the defined types were placed in this encounter.    No orders of the defined types were placed in this encounter.    Terrilyn Fick, MSN, FNP-C 03/28/2024, 10:20 AM  [facility] [address], [address] [place], [place] [address] [phone]

## 2024-03-28 NOTE — Patient Instructions (Signed)

## 2024-03-29 ENCOUNTER — Ambulatory Visit (INDEPENDENT_AMBULATORY_CARE_PROVIDER_SITE_OTHER): Payer: Medicare Other | Admitting: Family Medicine

## 2024-03-29 ENCOUNTER — Encounter: Payer: Self-pay | Admitting: Family Medicine

## 2024-03-29 VITALS — BP 132/74 | HR 88 | Ht 65.5 in | Wt 219.2 lb

## 2024-03-29 DIAGNOSIS — G479 Sleep disorder, unspecified: Secondary | ICD-10-CM

## 2024-03-29 DIAGNOSIS — G20C Parkinsonism, unspecified: Secondary | ICD-10-CM | POA: Diagnosis not present

## 2024-03-29 DIAGNOSIS — R251 Tremor, unspecified: Secondary | ICD-10-CM

## 2024-03-29 NOTE — Addendum Note (Signed)
 Addended by: Terrilyn Fick L on: 03/29/2024 10:49 AM   Modules accepted: Level of Service

## 2024-04-09 ENCOUNTER — Encounter

## 2024-04-17 ENCOUNTER — Other Ambulatory Visit: Payer: Self-pay | Admitting: Family Medicine

## 2024-04-17 DIAGNOSIS — E782 Mixed hyperlipidemia: Secondary | ICD-10-CM

## 2024-04-17 DIAGNOSIS — I10 Essential (primary) hypertension: Secondary | ICD-10-CM

## 2024-04-17 DIAGNOSIS — M1A00X0 Idiopathic chronic gout, unspecified site, without tophus (tophi): Secondary | ICD-10-CM

## 2024-05-01 ENCOUNTER — Other Ambulatory Visit: Payer: Self-pay

## 2024-05-01 DIAGNOSIS — R0602 Shortness of breath: Secondary | ICD-10-CM

## 2024-05-03 ENCOUNTER — Ambulatory Visit: Admitting: Pulmonary Disease

## 2024-05-03 DIAGNOSIS — R0602 Shortness of breath: Secondary | ICD-10-CM

## 2024-05-03 LAB — PULMONARY FUNCTION TEST
DL/VA % pred: 101 %
DL/VA: 4.16 ml/min/mmHg/L
DLCO unc % pred: 69 %
DLCO unc: 14.25 ml/min/mmHg
FEF 25-75 Post: 2.88 L/s
FEF 25-75 Pre: 2.42 L/s
FEF2575-%Change-Post: 19 %
FEF2575-%Pred-Post: 141 %
FEF2575-%Pred-Pre: 119 %
FEV1-%Change-Post: 4 %
FEV1-%Pred-Post: 82 %
FEV1-%Pred-Pre: 79 %
FEV1-Post: 2.03 L
FEV1-Pre: 1.95 L
FEV1FVC-%Change-Post: 0 %
FEV1FVC-%Pred-Pre: 110 %
FEV6-%Change-Post: 3 %
FEV6-%Pred-Post: 77 %
FEV6-%Pred-Pre: 75 %
FEV6-Post: 2.4 L
FEV6-Pre: 2.32 L
FEV6FVC-%Pred-Post: 104 %
FEV6FVC-%Pred-Pre: 104 %
FVC-%Change-Post: 3 %
FVC-%Pred-Post: 74 %
FVC-%Pred-Pre: 72 %
FVC-Post: 2.4 L
FVC-Pre: 2.32 L
Post FEV1/FVC ratio: 85 %
Post FEV6/FVC ratio: 100 %
Pre FEV1/FVC ratio: 84 %
Pre FEV6/FVC Ratio: 100 %
RV % pred: 98 %
RV: 2.21 L
TLC % pred: 83 %
TLC: 4.41 L

## 2024-05-03 NOTE — Progress Notes (Signed)
 Full pft performed today.

## 2024-05-03 NOTE — Patient Instructions (Signed)
 Full pft performed today.

## 2024-05-04 ENCOUNTER — Ambulatory Visit (INDEPENDENT_AMBULATORY_CARE_PROVIDER_SITE_OTHER): Admitting: Pulmonary Disease

## 2024-05-04 VITALS — BP 116/75 | HR 75 | Ht 65.5 in | Wt 220.4 lb

## 2024-05-04 DIAGNOSIS — R0602 Shortness of breath: Secondary | ICD-10-CM

## 2024-05-04 NOTE — Progress Notes (Signed)
 Becky Lara    [id number]    1956-09-11  Primary Care Physician:Henson, Boby CROME, NP-C  Referring Physician: Lendia Boby CROME, NP-C [address] [place],  [place] [address]  Chief complaint:   Patient being seen for shortness of breath In for follow-up, shortness of breath is improved  HPI:  Overall shortness of breath is improved  Has been more active  Denies any chronic cough Sometimes does have a cough when she is laying down to sleep  Never smoker, did quit using marijuana in 2022  No history of asthma  Worked with some kind of machinery, uses a mask  No significant exposure at work or growing up No chest pains or chest discomfort  Did remember episode of shortness of breath last year that led to a CT being obtained, concern at the time was for blood clots which was negative, CT scan did show some atelectasis at the bases    Outpatient Encounter Medications as of 05/04/2024  Medication Sig   allopurinol  (ZYLOPRIM ) 100 MG tablet TAKE 1 TABLET(100 MG) BY MOUTH DAILY   amLODipine  (NORVASC ) 10 MG tablet TAKE 1 TABLET BY MOUTH EVERY DAY   aspirin  EC 81 MG EC tablet Take 1 tablet (81 mg total) by mouth daily.   atorvastatin  (LIPITOR) 10 MG tablet TAKE 1 TABLET(10 MG) BY MOUTH DAILY   Cholecalciferol (VITAMIN D ) 125 MCG (5000 UT) CAPS Take by mouth.   clobetasol  cream (TEMOVATE ) 0.05 % Apply topically 2 (two) times daily. (Patient taking differently: Apply topically as needed.)   esomeprazole  (NEXIUM ) 40 MG capsule TAKE 1 CAPSULE(40 MG) BY MOUTH DAILY (Patient not taking: Reported on 03/29/2024)   lisinopril  (ZESTRIL ) 5 MG tablet TAKE 1 TABLET(5 MG) BY MOUTH DAILY (Patient not taking: Reported on 03/29/2024)   multivitamin-iron-minerals-folic acid (CENTRUM) chewable tablet Chew 1 tablet by mouth daily. (Patient not taking: Reported on 03/29/2024)   No facility-administered encounter medications on file as of 05/04/2024.    Allergies as of 05/04/2024   (No  Known Allergies)    Past Medical History:  Diagnosis Date   AKI (acute kidney injury) (HCC) 12/19/2014   Eczema    Esophageal reflux    Gastroesophageal reflux disease 03/09/2022   GERD (gastroesophageal reflux disease)    Gout    HTN (hypertension) 12/19/2014   Hyperlipidemia    Hypertension    Hypokalemia 12/19/2014   Osteopenia    Thoracic degenerative disc disease 12/08/2017   Per XR   Tobacco abuse    pt denies smoking cigarettes currently or as history, marijuana use only    Past Surgical History:  Procedure Laterality Date   COLONOSCOPY     2017   FOOT SURGERY     POLYPECTOMY     2017 TA x 1   TUBAL LIGATION      Family History  Problem Relation Age of Onset   Diabetes Mother    Heart attack Mother    Heart disease Mother 30       MI   Hyperlipidemia Mother    Heart disease Father 22       MI   Hypertension Brother    Alzheimer's disease Maternal Grandmother    Hypertension Daughter    Hypertension Son    Colon cancer Neg Hx    Colon polyps Neg Hx    Esophageal cancer Neg Hx    Rectal cancer Neg Hx    Stomach cancer Neg Hx  Breast cancer Neg Hx    Tremor Neg Hx    Parkinson's disease Neg Hx     Social History   Socioeconomic History   Marital status: Married    Spouse name: Elspeth   Number of children: 2   Years of education: Not on file   Highest education level: Not on file  Occupational History   Occupation: RETIRED  Tobacco Use   Smoking status: Former    Current packs/day: 0.00    Types: Cigarettes    Quit date: 01/14/2016    Years since quitting: 8.3   Smokeless tobacco: Never   Tobacco comments:    Smokes Marijuana -since age 47  Vaping Use   Vaping status: Never Used  Substance and Sexual Activity   Alcohol use: Not Currently    Alcohol/week: 6.0 standard drinks of alcohol    Types: 6 Glasses of wine per week    Comment: stopped alcohol   Drug use: Not Currently    Types: Marijuana    Comment: marijuana---does not smoke  cigarettes   Sexual activity: Not Currently    Partners: Male    Comment: 2 partners   Other Topics Concern   Not on file  Social History Narrative   Lives with husband   Social Drivers of Health   Financial Resource Strain: Low Risk  (12/01/2023)   Overall Financial Resource Strain (CARDIA)    Difficulty of Paying Living Expenses: Not very hard  Food Insecurity: No Food Insecurity (12/01/2023)   Hunger Vital Sign    Worried About Running Out of Food in the Last Year: Never true    Ran Out of Food in the Last Year: Never true  Transportation Needs: No Transportation Needs (12/01/2023)   PRAPARE - Administrator, Civil Service (Medical): No    Lack of Transportation (Non-Medical): No  Physical Activity: Inactive (12/01/2023)   Exercise Vital Sign    Days of Exercise per Week: 0 days    Minutes of Exercise per Session: 0 min  Stress: No Stress Concern Present (12/01/2023)   [employer] of Occupational Health - Occupational Stress Questionnaire    Feeling of Stress : Not at all  Social Connections: Socially Integrated (12/01/2023)   Social Connection and Isolation Panel    Frequency of Communication with Friends and Family: More than three times a week    Frequency of Social Gatherings with Friends and Family: Once a week    Attends Religious Services: More than 4 times per year    Active Member of [employer] or Organizations: Yes    Attends Banker Meetings: Never    Marital Status: Married  Catering manager Violence: Not At Risk (12/01/2023)   Humiliation, Afraid, Rape, and Kick questionnaire    Fear of Current or Ex-Partner: No    Emotionally Abused: No    Physically Abused: No    Sexually Abused: No    Review of Systems  Psychiatric/Behavioral:  Negative for sleep disturbance.     Vitals:   05/04/24 0833  BP: 116/75  Pulse: 75  SpO2: 94%     Physical Exam Constitutional:      Appearance: She is obese.  HENT:     Head: Normocephalic.      Mouth/Throat:     Mouth: Mucous membranes are moist.  Eyes:     General: No scleral icterus. Cardiovascular:     Rate and Rhythm: Normal rate and regular rhythm.     Heart sounds: No murmur  heard.    No friction rub.  Pulmonary:     Effort: No respiratory distress.     Breath sounds: No stridor. No wheezing or rhonchi.  Musculoskeletal:     Cervical back: No rigidity or tenderness.  Neurological:     Mental Status: She is alert.  Psychiatric:        Mood and Affect: Mood normal.    Data Reviewed: CT from June 2024 reviewed showing no PE, some atelectasis  PFT reviewed showing no significant obstruction, no significant bronchodilator response, no restriction, mild reduction in diffusing capacity  Echocardiogram with diastolic dysfunction, normal ejection fraction, normal right-sided pressures  Assessment:  Shortness of breath has significantly improved since her last visit  Stay more active  Denies any significant musculoskeletal pain or discomfort  Pulmonary function test is relatively normal  Deconditioning likely played a role in her shortness of breath and this is improving at present  Plan/Recommendations: Encouraged to stay active  Graded activity as tolerated  Will follow-up in about a year or as needed if she is feeling better at that time  Encouraged to call with significant concerns  No role for inhalers at present  Jennet Epley MD  Pulmonary and Critical Care 05/04/2024, 8:58 AM  CC: Lendia Boby CROME, NP-C

## 2024-05-04 NOTE — Patient Instructions (Signed)
 Your breathing study looks relatively normal  The ultrasound of your heart that you had done last year was also normal  Since your breathing has significantly improved I would suggest that you concentrate on exercising regularly, staying active  No other testing needs done at the present time  Tentatively, we will see you back in about a year If at that time you are feeling well with no breathing issues, that appointment can be canceled and we will just see you as needed

## 2024-06-18 ENCOUNTER — Other Ambulatory Visit: Payer: Self-pay | Admitting: Family Medicine

## 2024-06-18 DIAGNOSIS — Z1231 Encounter for screening mammogram for malignant neoplasm of breast: Secondary | ICD-10-CM

## 2024-07-18 ENCOUNTER — Ambulatory Visit
Admission: RE | Admit: 2024-07-18 | Discharge: 2024-07-18 | Disposition: A | Discharge status: Home / Self Care | Source: Ambulatory Visit | Attending: Family Medicine | Admitting: Family Medicine

## 2024-07-18 DIAGNOSIS — Z1231 Encounter for screening mammogram for malignant neoplasm of breast: Secondary | ICD-10-CM | POA: Diagnosis not present

## 2024-07-20 ENCOUNTER — Encounter: Payer: Self-pay | Admitting: Family Medicine

## 2024-07-20 ENCOUNTER — Ambulatory Visit: Admitting: Family Medicine

## 2024-07-20 VITALS — BP 122/70 | HR 75 | Temp 97.6°F | Ht 65.5 in | Wt 210.8 lb

## 2024-07-20 DIAGNOSIS — J029 Acute pharyngitis, unspecified: Secondary | ICD-10-CM | POA: Diagnosis not present

## 2024-07-20 DIAGNOSIS — R053 Chronic cough: Secondary | ICD-10-CM | POA: Diagnosis not present

## 2024-07-20 DIAGNOSIS — L089 Local infection of the skin and subcutaneous tissue, unspecified: Secondary | ICD-10-CM

## 2024-07-20 LAB — POCT RAPID STREP A (OFFICE): Rapid Strep A Screen: NEGATIVE

## 2024-07-20 MED ORDER — AMOXICILLIN-POT CLAVULANATE 875-125 MG PO TABS: 1.0000 | ORAL_TABLET | Freq: Two times a day (BID) | ORAL | 0 refills | Status: DC

## 2024-07-20 MED ORDER — PROMETHAZINE-DM 6.25-15 MG/5ML PO SYRP: 2.5000 mL | ORAL_SOLUTION | Freq: Every evening | ORAL | 0 refills | Status: DC | PRN

## 2024-07-20 NOTE — Progress Notes (Signed)
 Subjective:    Discussed the use of AI scribe software for clinical note transcription with the patient, who gave verbal consent to proceed.  History of Present Illness Becky Lara is a 68 year old female who presents with persistent respiratory symptoms.  Upper respiratory symptoms - Nasal congestion, sneezing, and sore throat present for three weeks - Dry, non-productive cough persists, more pronounced at night and affecting sleep - Sore throat remains persistent, especially at night - No shortness of breath, sinus pain, or pressure - No body aches or fever - Ears feel normal - Energy levels have improved significantly since symptom onset - Over-the-counter medications (Thermaflu, Motrin, cough lozenges) have not provided relief  Cutaneous lesions of the breasts - Moles on breasts become intermittently sore and develop superficial sores approximately every two months for about one year - Ointment application aids healing, but lesions recur - Mammogram last year was normal - Recent mammogram performed, results normal      ROS as in subjective.   Objective: Vitals:   07/20/24 0813  BP: 122/70  Pulse: 75  Temp: 97.6 F (36.4 C)  SpO2: 98%    General appearance: Alert, WD/WN, no distress                             Skin: warm, no rash                           Head: no sinus tenderness                            Eyes: conjunctiva normal, corneas clear, PERRLA                            Ears: pearly TMs, external ear canals normal                          Nose: septum midline, turbinates swollen, with erythema and clear discharge             Mouth/throat: MMM, tongue normal, mild-mod pharyngeal erythema without exudate                           Neck: supple, no adenopathy, no thyromegaly, nontender                          Heart: RRR                         Lungs: CTA bilaterally, no wheezes, rales, or rhonchi      Assessment/Plan:  Assessment and  Plan Assessment & Plan Acute upper respiratory infection with persistent cough and sore throat Persistent cough and sore throat for three weeks without fever or body aches, suggesting non-influenza etiology. Possible strep throat exposure due to work environment. Throat erythematous without exudate. Dry cough, more pronounced nocturnally. Normal lung function tests and absence of wheezing. Improved energy levels. Extensive Motrin use noted, advised against due to potential renal, hepatic, and gastrointestinal risks. - Perform strep test to rule out streptococcal infection. - Advise reducing Motrin intake and consider using Tylenol  for pain management. - Prescribe cough drops for symptomatic relief. - Consider antibiotics if strep test is positive or  symptoms persist.  Recurrent superficial breast skin lesions Recurrent superficial skin lesions on the breast approximately every two months, sore but non-purulent. Long-standing moles present, but sore lesions are a new development over the past year. Previous mammogram normal, current results pending. Lesions suspected to be superficial skin issue unrelated to moles. - Advise follow-up with a dermatologist for further evaluation of skin lesions. - Request examination during a flare-up of the lesions. - Reviewed mammogram results which are negative

## 2024-07-20 NOTE — Patient Instructions (Signed)
 Take the antibiotic as prescribed with food and plenty of water.  Use the cough syrup at bedtime if needed but be aware this medication is sedating.

## 2024-08-28 ENCOUNTER — Ambulatory Visit: Admitting: Family Medicine

## 2024-09-05 ENCOUNTER — Ambulatory Visit: Admitting: Family Medicine

## 2024-09-05 ENCOUNTER — Encounter: Payer: Self-pay | Admitting: Family Medicine

## 2024-09-05 ENCOUNTER — Ambulatory Visit: Payer: Self-pay | Admitting: Family Medicine

## 2024-09-05 VITALS — BP 104/76 | HR 88 | Temp 97.6°F | Ht 65.5 in | Wt 204.0 lb

## 2024-09-05 DIAGNOSIS — I10 Essential (primary) hypertension: Secondary | ICD-10-CM | POA: Diagnosis not present

## 2024-09-05 DIAGNOSIS — E559 Vitamin D deficiency, unspecified: Secondary | ICD-10-CM

## 2024-09-05 DIAGNOSIS — E782 Mixed hyperlipidemia: Secondary | ICD-10-CM | POA: Diagnosis not present

## 2024-09-05 DIAGNOSIS — N183 Chronic kidney disease, stage 3 unspecified: Secondary | ICD-10-CM | POA: Insufficient documentation

## 2024-09-05 DIAGNOSIS — R82998 Other abnormal findings in urine: Secondary | ICD-10-CM

## 2024-09-05 DIAGNOSIS — M1A00X0 Idiopathic chronic gout, unspecified site, without tophus (tophi): Secondary | ICD-10-CM | POA: Diagnosis not present

## 2024-09-05 DIAGNOSIS — Z23 Encounter for immunization: Secondary | ICD-10-CM | POA: Diagnosis not present

## 2024-09-05 DIAGNOSIS — R630 Anorexia: Secondary | ICD-10-CM

## 2024-09-05 DIAGNOSIS — N3001 Acute cystitis with hematuria: Secondary | ICD-10-CM

## 2024-09-05 DIAGNOSIS — E66811 Obesity, class 1: Secondary | ICD-10-CM | POA: Diagnosis not present

## 2024-09-05 DIAGNOSIS — Z6833 Body mass index (BMI) 33.0-33.9, adult: Secondary | ICD-10-CM | POA: Diagnosis not present

## 2024-09-05 DIAGNOSIS — K5900 Constipation, unspecified: Secondary | ICD-10-CM | POA: Diagnosis not present

## 2024-09-05 DIAGNOSIS — E876 Hypokalemia: Secondary | ICD-10-CM | POA: Diagnosis not present

## 2024-09-05 LAB — LIPID PANEL
Cholesterol: 139 mg/dL (ref 0–200)
HDL: 55.3 mg/dL (ref >39.00)
LDL Cholesterol: 69 mg/dL (ref 0–99)
NonHDL: 83.49
Total CHOL/HDL Ratio: 3
Triglycerides: 73 mg/dL (ref 0.0–149.0)
VLDL: 14.6 mg/dL (ref 0.0–40.0)

## 2024-09-05 LAB — CBC WITH DIFFERENTIAL/PLATELET
Basophils Absolute: 0.1 K/uL (ref 0.0–0.1)
Basophils Relative: 1 % (ref 0.0–3.0)
Eosinophils Absolute: 0.3 K/uL (ref 0.0–0.7)
Eosinophils Relative: 2.5 % (ref 0.0–5.0)
HCT: 38 % (ref 36.0–46.0)
Hemoglobin: 12.6 g/dL (ref 12.0–15.0)
Lymphocytes Relative: 16 % (ref 12.0–46.0)
Lymphs Abs: 1.6 K/uL (ref 0.7–4.0)
MCHC: 33 g/dL (ref 30.0–36.0)
MCV: 84.9 fl (ref 78.0–100.0)
Monocytes Absolute: 0.7 K/uL (ref 0.1–1.0)
Monocytes Relative: 6.8 % (ref 3.0–12.0)
Neutro Abs: 7.5 K/uL (ref 1.4–7.7)
Neutrophils Relative %: 73.7 % (ref 43.0–77.0)
Platelets: 227 K/uL (ref 150.0–400.0)
RBC: 4.48 Mil/uL (ref 3.87–5.11)
RDW: 15.1 % (ref 11.5–15.5)
WBC: 10.2 K/uL (ref 4.0–10.5)

## 2024-09-05 LAB — POC URINALSYSI DIPSTICK (AUTOMATED)
Bilirubin, UA: NEGATIVE
Blood, UA: POSITIVE
Glucose, UA: NEGATIVE
Ketones, UA: NEGATIVE
Nitrite, UA: NEGATIVE
Protein, UA: POSITIVE — AB
Spec Grav, UA: 1.01 (ref 1.010–1.025)
Urobilinogen, UA: 0.2 U/dL
pH, UA: 6 (ref 5.0–8.0)

## 2024-09-05 LAB — COMPREHENSIVE METABOLIC PANEL WITH GFR
ALT: 7 U/L (ref 0–35)
AST: 11 U/L (ref 0–37)
Albumin: 4 g/dL (ref 3.5–5.2)
Alkaline Phosphatase: 85 U/L (ref 39–117)
BUN: 8 mg/dL (ref 6–23)
CO2: 26 meq/L (ref 19–32)
Calcium: 9.2 mg/dL (ref 8.4–10.5)
Chloride: 105 meq/L (ref 96–112)
Creatinine, Ser: 0.98 mg/dL (ref 0.40–1.20)
GFR: 58.79 mL/min — ABNORMAL LOW (ref >60.00)
Glucose, Bld: 90 mg/dL (ref 70–99)
Potassium: 3 meq/L — ABNORMAL LOW (ref 3.5–5.1)
Sodium: 141 meq/L (ref 135–145)
Total Bilirubin: 1 mg/dL (ref 0.2–1.2)
Total Protein: 7.5 g/dL (ref 6.0–8.3)

## 2024-09-05 LAB — VITAMIN D 25 HYDROXY (VIT D DEFICIENCY, FRACTURES): VITD: 75.15 ng/mL (ref 30.00–100.00)

## 2024-09-05 LAB — URIC ACID: Uric Acid, Serum: 7.8 mg/dL — ABNORMAL HIGH (ref 2.4–7.0)

## 2024-09-05 LAB — TSH: TSH: 1.69 u[IU]/mL (ref 0.35–5.50)

## 2024-09-05 MED ORDER — POTASSIUM CHLORIDE CRYS ER 10 MEQ PO TBCR
10.0000 meq | EXTENDED_RELEASE_TABLET | Freq: Two times a day (BID) | ORAL | 0 refills | Status: DC
Start: 2024-09-05 — End: 2024-09-18

## 2024-09-05 MED ORDER — NITROFURANTOIN MONOHYD MACRO 100 MG PO CAPS: 100.0000 mg | ORAL_CAPSULE | Freq: Two times a day (BID) | ORAL | 0 refills | Status: DC

## 2024-09-05 MED ORDER — LISINOPRIL 5 MG PO TABS: ORAL_TABLET | ORAL | 1 refills | Status: AC

## 2024-09-05 MED ORDER — POLYETHYLENE GLYCOL 3350 17 GM/SCOOP PO POWD: 17.0000 g | Freq: Every day | ORAL | 1 refills | Status: AC

## 2024-09-05 NOTE — Progress Notes (Signed)
 Subjective:     Patient ID: Becky Lara, female    DOB: June 11, 1956, 68 y.o.   MRN: [id number]  Chief Complaint  Patient presents with   Medical Management of Chronic Issues    6 month f/u    HPI  Discussed the use of AI scribe software for clinical note transcription with the patient, who gave verbal consent to proceed.  History of Present Illness Becky Lara is a 68 year old female with chronic kidney disease who presents for a six month follow-up and new urinary symptoms.  Lower urinary tract symptoms - Dark urine, dysuria, increased urinary frequency, and urgency - Pain, pressure, and burning during urination - Hematuria present - No prior history of urinary tract infections - Not sexually active - Symptoms began after consuming a gallon of lemonade from [employer] - No antibiotic use for current symptoms - Cranberry supplements taken since June - No fever, chills, back pain, abdominal pain, nausea, or vomiting  Hydration status - Drinks approximately two glasses of water per day  Bowel habits - Decreased appetite - Constipation present - No bowel movement in the past week despite use of Dulcolax - When bowel movements occur, they are mostly watery - No hematochezia  Medication management - Current medications: lisinopril , allopurinol , amlodipine , atorvastatin , Nexium , low dose aspirin , vitamin D , multivitamin - Needs refill for lisinopril      Health Maintenance Due  Topic Date Due   COVID-19 Vaccine (6 - 2025-26 season) 06/25/2024    Past Medical History:  Diagnosis Date   AKI (acute kidney injury) 12/19/2014   Eczema    Esophageal reflux    Gastroesophageal reflux disease 03/09/2022   GERD (gastroesophageal reflux disease)    Gout    HTN (hypertension) 12/19/2014   Hyperlipidemia    Hypertension    Hypokalemia 12/19/2014   Osteopenia    Thoracic degenerative disc disease 12/08/2017   Per XR   Tobacco abuse    pt denies smoking  cigarettes currently or as history, marijuana use only    Past Surgical History:  Procedure Laterality Date   COLONOSCOPY     2017   FOOT SURGERY     POLYPECTOMY     2017 TA x 1   TUBAL LIGATION      Family History  Problem Relation Age of Onset   Diabetes Mother    Heart attack Mother    Heart disease Mother 43       MI   Hyperlipidemia Mother    Heart disease Father 8       MI   Hypertension Brother    Alzheimer's disease Maternal Grandmother    Hypertension Daughter    Hypertension Son    Colon cancer Neg Hx    Colon polyps Neg Hx    Esophageal cancer Neg Hx    Rectal cancer Neg Hx    Stomach cancer Neg Hx    Breast cancer Neg Hx    Tremor Neg Hx    Parkinson's disease Neg Hx     Social History   Socioeconomic History   Marital status: Married    Spouse name: Elspeth   Number of children: 2   Years of education: Not on file   Highest education level: Not on file  Occupational History   Occupation: RETIRED  Tobacco Use   Smoking status: Former    Current packs/day: 0.00    Types: Cigarettes    Quit date: 01/14/2016    Years since quitting: 8.6  Smokeless tobacco: Never   Tobacco comments:    Smokes Marijuana -since age 30  Vaping Use   Vaping status: Never Used  Substance and Sexual Activity   Alcohol use: Not Currently    Alcohol/week: 6.0 standard drinks of alcohol    Types: 6 Glasses of wine per week    Comment: stopped alcohol   Drug use: Not Currently    Types: Marijuana    Comment: marijuana---does not smoke cigarettes   Sexual activity: Not Currently    Partners: Male    Comment: 2 partners   Other Topics Concern   Not on file  Social History Narrative   Lives with husband   Social Drivers of Health   Financial Resource Strain: Low Risk  (12/01/2023)   Overall Financial Resource Strain (CARDIA)    Difficulty of Paying Living Expenses: Not very hard  Food Insecurity: No Food Insecurity (12/01/2023)   Hunger Vital Sign    Worried  About Running Out of Food in the Last Year: Never true    Ran Out of Food in the Last Year: Never true  Transportation Needs: No Transportation Needs (12/01/2023)   PRAPARE - Administrator, Civil Service (Medical): No    Lack of Transportation (Non-Medical): No  Physical Activity: Inactive (12/01/2023)   Exercise Vital Sign    Days of Exercise per Week: 0 days    Minutes of Exercise per Session: 0 min  Stress: No Stress Concern Present (12/01/2023)   [employer] of Occupational Health - Occupational Stress Questionnaire    Feeling of Stress : Not at all  Social Connections: Socially Integrated (12/01/2023)   Social Connection and Isolation Panel    Frequency of Communication with Friends and Family: More than three times a week    Frequency of Social Gatherings with Friends and Family: Once a week    Attends Religious Services: More than 4 times per year    Active Member of [employer] or Organizations: Yes    Attends Banker Meetings: Never    Marital Status: Married  Catering Manager Violence: Not At Risk (12/01/2023)   Humiliation, Afraid, Rape, and Kick questionnaire    Fear of Current or Ex-Partner: No    Emotionally Abused: No    Physically Abused: No    Sexually Abused: No    Outpatient Medications Prior to Visit  Medication Sig Dispense Refill   allopurinol  (ZYLOPRIM ) 100 MG tablet TAKE 1 TABLET(100 MG) BY MOUTH DAILY 90 tablet 1   amLODipine  (NORVASC ) 10 MG tablet TAKE 1 TABLET BY MOUTH EVERY DAY 90 tablet 1   aspirin  EC 81 MG EC tablet Take 1 tablet (81 mg total) by mouth daily.     atorvastatin  (LIPITOR) 10 MG tablet TAKE 1 TABLET(10 MG) BY MOUTH DAILY 90 tablet 1   Cholecalciferol (VITAMIN D ) 125 MCG (5000 UT) CAPS Take by mouth.     clobetasol  cream (TEMOVATE ) 0.05 % Apply topically 2 (two) times daily. (Patient taking differently: Apply topically as needed.) 30 g 1   esomeprazole  (NEXIUM ) 40 MG capsule TAKE 1 CAPSULE(40 MG) BY MOUTH DAILY 90 capsule 2    multivitamin-iron-minerals-folic acid (CENTRUM) chewable tablet Chew 1 tablet by mouth daily.     lisinopril  (ZESTRIL ) 5 MG tablet TAKE 1 TABLET(5 MG) BY MOUTH DAILY 90 tablet 1   amoxicillin -clavulanate (AUGMENTIN ) 875-125 MG tablet Take 1 tablet by mouth 2 (two) times daily. 14 tablet 0   promethazine -dextromethorphan (PROMETHAZINE -DM) 6.25-15 MG/5ML syrup Take 2.5 mLs by mouth  at bedtime as needed for cough. 118 mL 0   No facility-administered medications prior to visit.    No Known Allergies  ROS Per HPI    Objective:    Physical Exam Constitutional:      General: She is not in acute distress.    Appearance: She is not ill-appearing.  HENT:     Mouth/Throat:     Mouth: Mucous membranes are moist.     Pharynx: Oropharynx is clear.  Eyes:     Extraocular Movements: Extraocular movements intact.     Conjunctiva/sclera: Conjunctivae normal.     Pupils: Pupils are equal, round, and reactive to light.  Cardiovascular:     Rate and Rhythm: Normal rate and regular rhythm.  Pulmonary:     Effort: Pulmonary effort is normal.     Breath sounds: Normal breath sounds.  Abdominal:     General: Abdomen is flat. Bowel sounds are normal.     Palpations: Abdomen is soft.     Tenderness: There is no abdominal tenderness. There is no right CVA tenderness, left CVA tenderness, guarding or rebound. Negative signs include Murphy's sign and McBurney's sign.  Musculoskeletal:     Cervical back: Normal range of motion and neck supple. No tenderness.     Right lower leg: No edema.     Left lower leg: No edema.  Lymphadenopathy:     Cervical: No cervical adenopathy.  Skin:    General: Skin is warm and dry.  Neurological:     General: No focal deficit present.     Mental Status: She is alert and oriented to person, place, and time.     Cranial Nerves: No cranial nerve deficit.     Motor: No weakness.     Coordination: Coordination normal.     Gait: Gait normal.  Psychiatric:        Mood  and Affect: Mood normal.        Behavior: Behavior normal.        Thought Content: Thought content normal.      BP 104/76   Pulse 88   Temp 97.6 F (36.4 C) (Temporal)   Ht 5' 5.5 (1.664 m)   Wt 204 lb (92.5 kg)   LMP 04/26/2008   SpO2 99%   BMI 33.43 kg/m  Wt Readings from Last 3 Encounters:  09/05/24 204 lb (92.5 kg)  07/20/24 210 lb 12.8 oz (95.6 kg)  05/04/24 220 lb 6.4 oz (100 kg)       Assessment & Plan:   Problem List Items Addressed This Visit     Chronic gout   Relevant Orders   Uric acid (Completed)   Mixed hyperlipidemia   Relevant Medications   lisinopril  (ZESTRIL ) 5 MG tablet   Other Relevant Orders   Lipid panel (Completed)   Primary hypertension   Relevant Medications   lisinopril  (ZESTRIL ) 5 MG tablet   Other Relevant Orders   CBC with Differential/Platelet (Completed)   Comprehensive metabolic panel with GFR (Completed)   TSH (Completed)   Stage 3 chronic kidney disease (HCC)   Vitamin D  deficiency   Relevant Orders   VITAMIN D  25 Hydroxy (Vit-D Deficiency, Fractures) (Completed)   Other Visit Diagnoses       Acute cystitis with hematuria    -  Primary   Relevant Orders   Urine Culture     Dark urine       Relevant Orders   POCT Urinalysis Dipstick (Automated) (Completed)  Need for influenza vaccination       Relevant Orders   Flu vaccine HIGH DOSE PF(Fluzone Trivalent) (Completed)     Constipation, unspecified constipation type       Relevant Orders   TSH (Completed)     Poor appetite       Relevant Orders   TSH (Completed)     Obesity (BMI 30.0-34.9)       Relevant Orders   CBC with Differential/Platelet (Completed)   Comprehensive metabolic panel with GFR (Completed)   TSH (Completed)   Lipid panel (Completed)     Hypokalemia           Assessment and Plan Assessment & Plan Acute cystitis with hematuria Symptoms of dysuria, urgency, and dark urine for one month. No prior urinary tract infections. Possible  dehydration or recent high sugar intake may have contributed. Hematuria present, indicating possible infection. - UA shows Leuks 3+, blood 3+, pro 1+ - Prescribed antibiotic for urinary tract infection - Sent urine for culture to identify bacteria and appropriate antibiotic - Advised increased water intake to at least four glasses per day - Scheduled follow-up in 2-3 weeks to recheck urine  Chronic kidney disease, unspecified stage Chronic kidney disease managed with lisinopril . - Refilled lisinopril  prescription - check renal function   Essential hypertension Hypertension managed with lisinopril  and amlodipine . - Continue current antihypertensive regimen  Constipation Chronic constipation with recent exacerbation. Currently using Dulcolax, which is not preferred due to its stimulant nature. - Discontinued Dulcolax - Prescribed Miralax for daily use - Advised increased water intake  General Health Maintenance Routine health maintenance discussed, including medication adherence and lifestyle modifications. - Continue current medications: Nexium , low dose aspirin , vitamin D , and multivitamin  Hyperlipidemia No new issues reported. -check lipid panel   Vitamin D  deficiency No new issues reported. - check vitamin D  level   Chronic gout No new gout attacks reported. - check uric acid level      I have discontinued Kandee K. Wernick's amoxicillin -clavulanate and promethazine -dextromethorphan. I am also having her start on polyethylene glycol powder, nitrofurantoin (macrocrystal-monohydrate), and potassium chloride . Additionally, I am having her maintain her aspirin  EC, multivitamin-iron-minerals-folic acid, Vitamin D , clobetasol  cream, esomeprazole , amLODipine , atorvastatin , allopurinol , and lisinopril .  Meds ordered this encounter  Medications   lisinopril  (ZESTRIL ) 5 MG tablet    Sig: TAKE 1 TABLET(5 MG) BY MOUTH DAILY    Dispense:  90 tablet    Refill:  1    Supervising  Provider:   ROLLENE NORRIS A [[id number]]   polyethylene glycol powder (GLYCOLAX/MIRALAX) 17 GM/SCOOP powder    Sig: Take 17 g by mouth daily. Dissolve 1 capful (17g) in 4-8 ounces of liquid and take by mouth daily.    Dispense:  765 g    Refill:  1    Supervising Provider:   ROLLENE NORRIS A [[id number]]   nitrofurantoin, macrocrystal-monohydrate, (MACROBID) 100 MG capsule    Sig: Take 1 capsule (100 mg total) by mouth 2 (two) times daily.    Dispense:  10 capsule    Refill:  0    Supervising Provider:   ROLLENE NORRIS A [[id number]]   potassium chloride  (KLOR-CON  M) 10 MEQ tablet    Sig: Take 1 tablet (10 mEq total) by mouth 2 (two) times daily.    Dispense:  10 tablet    Refill:  0    Supervising Provider:   ROLLENE NORRIS A [[id number]]

## 2024-09-05 NOTE — Patient Instructions (Signed)
 Please go downstairs for labs before you leave.  Take the antibiotic as prescribed and increase your water intake  Try MiraLAX.  You can buy this over-the-counter if it is expensive with the prescription.  Stop taking Dulcolax.  Follow-up with me in approximately 2 weeks or sooner if you have any worsening symptoms.

## 2024-09-06 LAB — URINE CULTURE

## 2024-09-18 ENCOUNTER — Other Ambulatory Visit: Payer: Self-pay | Admitting: Family Medicine

## 2024-09-18 ENCOUNTER — Encounter: Payer: Self-pay | Admitting: Family Medicine

## 2024-09-18 ENCOUNTER — Ambulatory Visit: Admitting: Family Medicine

## 2024-09-18 ENCOUNTER — Ambulatory Visit: Payer: Self-pay | Admitting: Family Medicine

## 2024-09-18 VITALS — BP 128/74 | HR 75 | Temp 98.0°F | Ht 65.0 in | Wt 205.2 lb

## 2024-09-18 DIAGNOSIS — N3001 Acute cystitis with hematuria: Secondary | ICD-10-CM | POA: Diagnosis not present

## 2024-09-18 DIAGNOSIS — R82998 Other abnormal findings in urine: Secondary | ICD-10-CM | POA: Diagnosis not present

## 2024-09-18 DIAGNOSIS — R319 Hematuria, unspecified: Secondary | ICD-10-CM

## 2024-09-18 DIAGNOSIS — E876 Hypokalemia: Secondary | ICD-10-CM

## 2024-09-18 DIAGNOSIS — K5900 Constipation, unspecified: Secondary | ICD-10-CM

## 2024-09-18 LAB — BASIC METABOLIC PANEL WITH GFR
BUN: 6 mg/dL (ref 6–23)
CO2: 27 meq/L (ref 19–32)
Calcium: 9.3 mg/dL (ref 8.4–10.5)
Chloride: 106 meq/L (ref 96–112)
Creatinine, Ser: 0.99 mg/dL (ref 0.40–1.20)
GFR: 58.07 mL/min — ABNORMAL LOW (ref >60.00)
Glucose, Bld: 101 mg/dL — ABNORMAL HIGH (ref 70–99)
Potassium: 3.4 meq/L — ABNORMAL LOW (ref 3.5–5.1)
Sodium: 139 meq/L (ref 135–145)

## 2024-09-18 LAB — POCT URINALYSIS DIPSTICK
Bilirubin, UA: NEGATIVE
Glucose, UA: NEGATIVE
Ketones, UA: NEGATIVE
Nitrite, UA: NEGATIVE
Protein, UA: POSITIVE — AB
Spec Grav, UA: 1.01 (ref 1.010–1.025)
Urobilinogen, UA: 0.2 U/dL
pH, UA: 6 (ref 5.0–8.0)

## 2024-09-18 MED ORDER — POTASSIUM CHLORIDE CRYS ER 10 MEQ PO TBCR: 10.0000 meq | EXTENDED_RELEASE_TABLET | Freq: Every day | ORAL | 1 refills | Status: DC

## 2024-09-18 NOTE — Patient Instructions (Signed)
 Please go downstairs for labs before you leave  If you have not heard from the urologist in 2 weeks, let us  know.

## 2024-09-18 NOTE — Progress Notes (Signed)
 Subjective:     Patient ID: Becky Lara, female    DOB: [birth year], 68 y.o.   MRN: [id number]  Chief Complaint  Patient presents with   Follow-up    2 wk f/u symptoms have subsided     HPI  Discussed the use of AI scribe software for clinical note transcription with the patient, who gave verbal consent to proceed.  History of Present Illness Becky Lara is a 68 year old female who presents for a two-week follow-up on constipation and hematuria.  Constipation - Constipation has resolved with use of Miralax .  Urinary tract symptoms - Urinary tract infection symptoms have resolved.  - urine culture was negative  - here for reassessment of urine to ensure resolution   Electrolyte management - Takes potassium supplements for low potassium levels. - Diet is low in potassium-rich foods.     Health Maintenance Due  Topic Date Due   COVID-19 Vaccine (6 - 2025-26 season) 06/25/2024    Past Medical History:  Diagnosis Date   AKI (acute kidney injury) 12/19/2014   Eczema    Esophageal reflux    Gastroesophageal reflux disease 03/09/2022   GERD (gastroesophageal reflux disease)    Gout    HTN (hypertension) 12/19/2014   Hyperlipidemia    Hypertension    Hypokalemia 12/19/2014   Osteopenia    Thoracic degenerative disc disease 12/08/2017   Per XR   Tobacco abuse    pt denies smoking cigarettes currently or as history, marijuana use only    Past Surgical History:  Procedure Laterality Date   COLONOSCOPY     2017   FOOT SURGERY     POLYPECTOMY     2017 TA x 1   TUBAL LIGATION      Family History  Problem Relation Age of Onset   Diabetes Mother    Heart attack Mother    Heart disease Mother 19       MI   Hyperlipidemia Mother    Heart disease Father 42       MI   Hypertension Brother    Alzheimer's disease Maternal Grandmother    Hypertension Daughter    Hypertension Son    Colon cancer Neg Hx    Colon polyps Neg Hx    Esophageal cancer Neg  Hx    Rectal cancer Neg Hx    Stomach cancer Neg Hx    Breast cancer Neg Hx    Tremor Neg Hx    Parkinson's disease Neg Hx     Social History   Socioeconomic History   Marital status: Married    Spouse name: Elspeth   Number of children: 2   Years of education: Not on file   Highest education level: Not on file  Occupational History   Occupation: RETIRED  Tobacco Use   Smoking status: Former    Current packs/day: 0.00    Types: Cigarettes    Quit date: 01/14/2016    Years since quitting: 8.6   Smokeless tobacco: Never   Tobacco comments:    Smokes Marijuana -since age 58  Vaping Use   Vaping status: Never Used  Substance and Sexual Activity   Alcohol use: Not Currently    Alcohol/week: 6.0 standard drinks of alcohol    Types: 6 Glasses of wine per week    Comment: stopped alcohol   Drug use: Not Currently    Types: Marijuana    Comment: marijuana---does not smoke cigarettes   Sexual activity: Not Currently  Partners: Male    Comment: 2 partners   Other Topics Concern   Not on file  Social History Narrative   Lives with husband   Social Drivers of Health   Financial Resource Strain: Low Risk  (12/01/2023)   Overall Financial Resource Strain (CARDIA)    Difficulty of Paying Living Expenses: Not very hard  Food Insecurity: No Food Insecurity (12/01/2023)   Hunger Vital Sign    Worried About Running Out of Food in the Last Year: Never true    Ran Out of Food in the Last Year: Never true  Transportation Needs: No Transportation Needs (12/01/2023)   PRAPARE - Administrator, Civil Service (Medical): No    Lack of Transportation (Non-Medical): No  Physical Activity: Inactive (12/01/2023)   Exercise Vital Sign    Days of Exercise per Week: 0 days    Minutes of Exercise per Session: 0 min  Stress: No Stress Concern Present (12/01/2023)   [employer] of Occupational Health - Occupational Stress Questionnaire    Feeling of Stress : Not at all  Social  Connections: Socially Integrated (12/01/2023)   Social Connection and Isolation Panel    Frequency of Communication with Friends and Family: More than three times a week    Frequency of Social Gatherings with Friends and Family: Once a week    Attends Religious Services: More than 4 times per year    Active Member of [employer] or Organizations: Yes    Attends Banker Meetings: Never    Marital Status: Married  Catering Manager Violence: Not At Risk (12/01/2023)   Humiliation, Afraid, Rape, and Kick questionnaire    Fear of Current or Ex-Partner: No    Emotionally Abused: No    Physically Abused: No    Sexually Abused: No    Outpatient Medications Prior to Visit  Medication Sig Dispense Refill   allopurinol  (ZYLOPRIM ) 100 MG tablet TAKE 1 TABLET(100 MG) BY MOUTH DAILY 90 tablet 1   amLODipine  (NORVASC ) 10 MG tablet TAKE 1 TABLET BY MOUTH EVERY DAY 90 tablet 1   aspirin  EC 81 MG EC tablet Take 1 tablet (81 mg total) by mouth daily.     atorvastatin  (LIPITOR) 10 MG tablet TAKE 1 TABLET(10 MG) BY MOUTH DAILY 90 tablet 1   Cholecalciferol (VITAMIN D ) 125 MCG (5000 UT) CAPS Take by mouth.     clobetasol  cream (TEMOVATE ) 0.05 % Apply topically 2 (two) times daily. (Patient taking differently: Apply topically as needed.) 30 g 1   esomeprazole  (NEXIUM ) 40 MG capsule TAKE 1 CAPSULE(40 MG) BY MOUTH DAILY 90 capsule 2   lisinopril  (ZESTRIL ) 5 MG tablet TAKE 1 TABLET(5 MG) BY MOUTH DAILY 90 tablet 1   multivitamin-iron-minerals-folic acid (CENTRUM) chewable tablet Chew 1 tablet by mouth daily.     nitrofurantoin , macrocrystal-monohydrate, (MACROBID ) 100 MG capsule Take 1 capsule (100 mg total) by mouth 2 (two) times daily. 10 capsule 0   polyethylene glycol powder (GLYCOLAX /MIRALAX ) 17 GM/SCOOP powder Take 17 g by mouth daily. Dissolve 1 capful (17g) in 4-8 ounces of liquid and take by mouth daily. 765 g 1   potassium chloride  (KLOR-CON  M) 10 MEQ tablet Take 1 tablet (10 mEq total) by mouth 2  (two) times daily. 10 tablet 0   No facility-administered medications prior to visit.    No Known Allergies  Review of Systems  Constitutional:  Negative for chills and fever.  Respiratory:  Negative for shortness of breath.   Cardiovascular:  Negative for  chest pain, palpitations and leg swelling.  Gastrointestinal:  Negative for abdominal pain, constipation, diarrhea, nausea and vomiting.  Genitourinary:  Negative for dysuria, frequency and urgency.  Neurological:  Negative for dizziness, focal weakness and headaches.       Objective:    Physical Exam Constitutional:      General: She is not in acute distress.    Appearance: She is not ill-appearing.  Eyes:     Extraocular Movements: Extraocular movements intact.     Conjunctiva/sclera: Conjunctivae normal.  Cardiovascular:     Rate and Rhythm: Normal rate.  Pulmonary:     Effort: Pulmonary effort is normal.  Musculoskeletal:     Cervical back: Normal range of motion and neck supple.  Skin:    General: Skin is warm and dry.  Neurological:     General: No focal deficit present.     Mental Status: She is alert and oriented to person, place, and time.  Psychiatric:        Mood and Affect: Mood normal.        Behavior: Behavior normal.        Thought Content: Thought content normal.      BP 128/74   Pulse 75   Temp 98 F (36.7 C) (Oral)   Ht 5' 5 (1.651 m)   Wt 205 lb 3.2 oz (93.1 kg)   LMP 04/26/2008   SpO2 99%   BMI 34.15 kg/m  Wt Readings from Last 3 Encounters:  09/18/24 205 lb 3.2 oz (93.1 kg)  09/05/24 204 lb (92.5 kg)  07/20/24 210 lb 12.8 oz (95.6 kg)       Assessment & Plan:   Problem List Items Addressed This Visit   None Visit Diagnoses       Hematuria, unspecified type    -  Primary   Relevant Orders   Ambulatory referral to Urology     Dark urine       Relevant Orders   POCT urinalysis dipstick (Completed)   Urine Culture     Acute cystitis with hematuria         Constipation,  unspecified constipation type         Hypokalemia       Relevant Orders   Basic metabolic panel with GFR (Completed)       Assessment and Plan Assessment & Plan Hematuria Persistent hematuria despite resolution of urinary symptoms. Urinalysis shows significant blood presence. Differential includes benign causes versus more serious conditions such as bladder polyps or malignancy  - Ordered urine culture - Referred to urology for further evaluation  Constipation Improved with Miralax  use.  Hypokalemia Previous hypokalemia treated with potassium supplementation. Current potassium levels need re-evaluation. Dietary intake may be insufficient in potassium-rich foods. - Recheck potassium levels - Discussed potassium-rich diet including bananas, green leafy vegetables, avocado, beets, pumpkin, winter squash, lima beans, and nuts  Potassium continues to be low. Recommend K-Dur 10 MEQ daily   I have changed Nya K. Elem's potassium chloride . I am also having her maintain her aspirin  EC, multivitamin-iron-minerals-folic acid, Vitamin D , clobetasol  cream, esomeprazole , amLODipine , atorvastatin , allopurinol , lisinopril , polyethylene glycol powder, and nitrofurantoin  (macrocrystal-monohydrate).  Meds ordered this encounter  Medications   potassium chloride  (KLOR-CON  M) 10 MEQ tablet    Sig: Take 1 tablet (10 mEq total) by mouth daily.    Dispense:  30 tablet    Refill:  1    Supervising Provider:   ROLLENE NORRIS A [[id number]]

## 2024-09-20 ENCOUNTER — Other Ambulatory Visit: Payer: Self-pay | Admitting: Family Medicine

## 2024-09-20 LAB — URINE CULTURE

## 2024-09-20 MED ORDER — NITROFURANTOIN MONOHYD MACRO 100 MG PO CAPS: 100.0000 mg | ORAL_CAPSULE | Freq: Two times a day (BID) | ORAL | 0 refills | Status: DC

## 2024-10-13 ENCOUNTER — Other Ambulatory Visit: Payer: Self-pay | Admitting: Family Medicine

## 2024-10-13 DIAGNOSIS — E782 Mixed hyperlipidemia: Secondary | ICD-10-CM

## 2024-10-13 DIAGNOSIS — I10 Essential (primary) hypertension: Secondary | ICD-10-CM

## 2024-10-13 DIAGNOSIS — M1A00X0 Idiopathic chronic gout, unspecified site, without tophus (tophi): Secondary | ICD-10-CM

## 2024-11-14 ENCOUNTER — Ambulatory Visit: Payer: Self-pay | Admitting: Family Medicine

## 2024-11-14 ENCOUNTER — Ambulatory Visit: Payer: PRIVATE HEALTH INSURANCE | Admitting: Family Medicine

## 2024-11-14 ENCOUNTER — Encounter: Payer: Self-pay | Admitting: Family Medicine

## 2024-11-14 VITALS — BP 112/74 | HR 80 | Temp 98.0°F | Ht 65.0 in | Wt 209.0 lb

## 2024-11-14 DIAGNOSIS — E669 Obesity, unspecified: Secondary | ICD-10-CM

## 2024-11-14 DIAGNOSIS — M1A00X0 Idiopathic chronic gout, unspecified site, without tophus (tophi): Secondary | ICD-10-CM | POA: Diagnosis not present

## 2024-11-14 DIAGNOSIS — G20C Parkinsonism, unspecified: Secondary | ICD-10-CM | POA: Diagnosis not present

## 2024-11-14 DIAGNOSIS — E876 Hypokalemia: Secondary | ICD-10-CM | POA: Diagnosis not present

## 2024-11-14 DIAGNOSIS — R319 Hematuria, unspecified: Secondary | ICD-10-CM | POA: Diagnosis not present

## 2024-11-14 DIAGNOSIS — N183 Chronic kidney disease, stage 3 unspecified: Secondary | ICD-10-CM

## 2024-11-14 DIAGNOSIS — Z87440 Personal history of urinary (tract) infections: Secondary | ICD-10-CM

## 2024-11-14 DIAGNOSIS — R7309 Other abnormal glucose: Secondary | ICD-10-CM | POA: Diagnosis not present

## 2024-11-14 DIAGNOSIS — R3915 Urgency of urination: Secondary | ICD-10-CM | POA: Diagnosis not present

## 2024-11-14 DIAGNOSIS — E559 Vitamin D deficiency, unspecified: Secondary | ICD-10-CM

## 2024-11-14 DIAGNOSIS — I10 Essential (primary) hypertension: Secondary | ICD-10-CM

## 2024-11-14 LAB — LIPID PANEL
Cholesterol: 159 mg/dL (ref 28–200)
HDL: 59.6 mg/dL
LDL Cholesterol: 79 mg/dL (ref 10–99)
NonHDL: 99.62
Total CHOL/HDL Ratio: 3
Triglycerides: 102 mg/dL (ref 10.0–149.0)
VLDL: 20.4 mg/dL (ref 0.0–40.0)

## 2024-11-14 LAB — COMPREHENSIVE METABOLIC PANEL WITH GFR
ALT: 11 U/L (ref 3–35)
AST: 17 U/L (ref 5–37)
Albumin: 4.1 g/dL (ref 3.5–5.2)
Alkaline Phosphatase: 81 U/L (ref 39–117)
BUN: 12 mg/dL (ref 6–23)
CO2: 27 meq/L (ref 19–32)
Calcium: 9.4 mg/dL (ref 8.4–10.5)
Chloride: 104 meq/L (ref 96–112)
Creatinine, Ser: 1.07 mg/dL (ref 0.40–1.20)
GFR: 52.84 mL/min — ABNORMAL LOW
Glucose, Bld: 92 mg/dL (ref 70–99)
Potassium: 3.3 meq/L — ABNORMAL LOW (ref 3.5–5.1)
Sodium: 137 meq/L (ref 135–145)
Total Bilirubin: 0.7 mg/dL (ref 0.2–1.2)
Total Protein: 7.6 g/dL (ref 6.0–8.3)

## 2024-11-14 LAB — POC URINALSYSI DIPSTICK (AUTOMATED)
Bilirubin, UA: NEGATIVE
Blood, UA: NEGATIVE
Glucose, UA: NEGATIVE
Ketones, UA: NEGATIVE
Nitrite, UA: NEGATIVE
Protein, UA: NEGATIVE
Spec Grav, UA: <=1.005 — AB
Urobilinogen, UA: 0.2 U/dL
pH, UA: 6

## 2024-11-14 LAB — CBC WITH DIFFERENTIAL/PLATELET
Basophils Absolute: 0.1 K/uL (ref 0.0–0.1)
Basophils Relative: 0.7 % (ref 0.0–3.0)
Eosinophils Absolute: 0.3 K/uL (ref 0.0–0.7)
Eosinophils Relative: 3.8 % (ref 0.0–5.0)
HCT: 38.1 % (ref 36.0–46.0)
Hemoglobin: 12.7 g/dL (ref 12.0–15.0)
Lymphocytes Relative: 27.9 % (ref 12.0–46.0)
Lymphs Abs: 2.1 K/uL (ref 0.7–4.0)
MCHC: 33.2 g/dL (ref 30.0–36.0)
MCV: 86.1 fl (ref 78.0–100.0)
Monocytes Absolute: 0.5 K/uL (ref 0.1–1.0)
Monocytes Relative: 6.7 % (ref 3.0–12.0)
Neutro Abs: 4.7 K/uL (ref 1.4–7.7)
Neutrophils Relative %: 60.9 % (ref 43.0–77.0)
Platelets: 190 K/uL (ref 150.0–400.0)
RBC: 4.42 Mil/uL (ref 3.87–5.11)
RDW: 15.8 % — ABNORMAL HIGH (ref 11.5–15.5)
WBC: 7.7 K/uL (ref 4.0–10.5)

## 2024-11-14 LAB — HEMOGLOBIN A1C: Hgb A1c MFr Bld: 5 % (ref 4.6–6.5)

## 2024-11-14 LAB — URIC ACID: Uric Acid, Serum: 7.1 mg/dL — ABNORMAL HIGH (ref 2.4–7.0)

## 2024-11-14 LAB — VITAMIN D 25 HYDROXY (VIT D DEFICIENCY, FRACTURES): VITD: 72.59 ng/mL (ref 30.00–100.00)

## 2024-11-14 LAB — TSH: TSH: 2.35 u[IU]/mL (ref 0.35–5.50)

## 2024-11-14 MED ORDER — POTASSIUM CHLORIDE CRYS ER 10 MEQ PO TBCR: 10.0000 meq | EXTENDED_RELEASE_TABLET | Freq: Every day | ORAL | 5 refills | Status: AC

## 2024-11-14 MED ORDER — ALLOPURINOL 100 MG PO TABS: 200.0000 mg | ORAL_TABLET | Freq: Every day | ORAL | 1 refills | Status: AC

## 2024-11-14 NOTE — Patient Instructions (Addendum)
 Please go downstairs for labs and a urine test so we can culture your urine  I will be in touch with your results and with recommendations  Remember to let me know when you need refills of medications.     Kegel Exercises  Kegel exercises can help strengthen your pelvic floor muscles. The pelvic floor is a group of muscles that support your rectum, small intestine, and bladder. In females, pelvic floor muscles also help support the uterus. These muscles help you control the flow of urine and stool (feces). Kegel exercises are painless and simple. They do not require any equipment. Your provider may suggest Kegel exercises to: Improve bladder and bowel control. Improve sexual response. Improve weak pelvic floor muscles after surgery to remove the uterus (hysterectomy) or after pregnancy, in females. Improve weak pelvic floor muscles after prostate gland removal or surgery, in males. Kegel exercises involve squeezing your pelvic floor muscles. These are the same muscles you squeeze when you try to stop the flow of urine or keep from passing gas. The exercises can be done while sitting, standing, or lying down, but it is best to vary your position. Ask your health care provider which exercises are safe for you. Do exercises exactly as told by your health care provider and adjust them as directed. Do not begin these exercises until told by your health care provider. Exercises How to do Kegel exercises: Squeeze your pelvic floor muscles tight. You should feel a tight lift in your rectal area. If you are a female, you should also feel a tightness in your vaginal area. Keep your stomach, buttocks, and legs relaxed. Hold the muscles tight for up to 10 seconds. Breathe normally. Relax your muscles for up to 10 seconds. Repeat as told by your health care provider. Repeat this exercise daily as told by your health care provider. Continue to do this exercise for at least 4-6 weeks, or for as long as  told by your health care provider. You may be referred to a physical therapist who can help you learn more about how to do Kegel exercises. Depending on your condition, your health care provider may recommend: Varying how long you squeeze your muscles. Doing several sets of exercises every day. Doing exercises for several weeks. Making Kegel exercises a part of your regular exercise routine. This information is not intended to replace advice given to you by your health care provider. Make sure you discuss any questions you have with your health care provider. Document Revised: 02/19/2021 Document Reviewed: 02/19/2021 Elsevier Patient Education  2024 [employer].

## 2024-11-14 NOTE — Progress Notes (Signed)
 "  Subjective:     Patient ID: Becky Lara, female    DOB: [birth year], 69 y.o.   MRN: [id number]  Chief Complaint  Patient presents with   Medical Management of Chronic Issues    8 week f/u    HPI  Discussed the use of AI scribe software for clinical note transcription with the patient, who gave verbal consent to proceed.  History of Present Illness Becky Lara is a 69 year old female with chronic kidney disease stage three who presents for follow-up, including evaluation of hematuria.  Hematuria and lower urinary tract symptoms - Recurrent hematuria without dysuria - Urinary urgency without incontience - Rare urinary leakage, does not require pads - Able to hold urine for several hours while at work - History of urinary tract infection treated with antibiotics in November; urinary urgency has persisted for months to years - Past smoking history, which is a concern regarding hematuria  Chronic kidney disease - Chronic kidney disease stage 3  Electrolyte abnormalities - Hypokalemia - Recently ran out of prescribed potassium supplements before Christmas - Attempted to increase dietary potassium intake with more grains and kale  Gout - Takes allopurinol  100 mg daily - No recent gout flares  Dietary habits and supplement use - Prefers sweets over regular meals - Maternal history of diabetes - Takes vitamin D  daily, currently taking two pills instead of one  Cardiopulmonary symptoms - No chest pain, shortness of breath, or swelling     Health Maintenance Due  Topic Date Due   COVID-19 Vaccine (6 - 2025-26 season) 06/25/2024   Medicare Annual Wellness (AWV)  11/30/2024    Past Medical History:  Diagnosis Date   AKI (acute kidney injury) 12/19/2014   Eczema    Esophageal reflux    Gastroesophageal reflux disease 03/09/2022   GERD (gastroesophageal reflux disease)    Gout    HTN (hypertension) 12/19/2014   Hyperlipidemia    Hypertension    Hypokalemia  12/19/2014   Osteopenia    Thoracic degenerative disc disease 12/08/2017   Per XR   Tobacco abuse    pt denies smoking cigarettes currently or as history, marijuana use only    Past Surgical History:  Procedure Laterality Date   COLONOSCOPY     2017   FOOT SURGERY     POLYPECTOMY     2017 TA x 1   TUBAL LIGATION      Family History  Problem Relation Age of Onset   Diabetes Mother    Heart attack Mother    Heart disease Mother 52       MI   Hyperlipidemia Mother    Heart disease Father 74       MI   Hypertension Brother    Alzheimer's disease Maternal Grandmother    Hypertension Daughter    Hypertension Son    Colon cancer Neg Hx    Colon polyps Neg Hx    Esophageal cancer Neg Hx    Rectal cancer Neg Hx    Stomach cancer Neg Hx    Breast cancer Neg Hx    Tremor Neg Hx    Parkinson's disease Neg Hx     Social History   Socioeconomic History   Marital status: Married    Spouse name: Elspeth   Number of children: 2   Years of education: Not on file   Highest education level: Not on file  Occupational History   Occupation: RETIRED  Tobacco Use   Smoking  status: Former    Current packs/day: 0.00    Average packs/day: 0.1 packs/day    Types: Cigarettes    Quit date: 01/14/2016    Years since quitting: 8.8   Smokeless tobacco: Never   Tobacco comments:    Smoked Marijuana -stopped in 2021   Vaping Use   Vaping status: Never Used  Substance and Sexual Activity   Alcohol use: Not Currently    Alcohol/week: 6.0 standard drinks of alcohol    Types: 6 Glasses of wine per week    Comment: stopped alcohol   Drug use: Not Currently    Types: Marijuana    Comment: marijuana---does not smoke cigarettes   Sexual activity: Not Currently    Partners: Male    Comment: 2 partners   Other Topics Concern   Not on file  Social History Narrative   Lives with husband   Social Drivers of Health   Tobacco Use: Medium Risk (11/14/2024)   Patient History    Smoking  Tobacco Use: Former    Smokeless Tobacco Use: Never    Passive Exposure: Not on Actuary Strain: Low Risk (12/01/2023)   Overall Financial Resource Strain (CARDIA)    Difficulty of Paying Living Expenses: Not very hard  Food Insecurity: No Food Insecurity (12/01/2023)   Hunger Vital Sign    Worried About Running Out of Food in the Last Year: Never true    Ran Out of Food in the Last Year: Never true  Transportation Needs: No Transportation Needs (12/01/2023)   PRAPARE - Administrator, Civil Service (Medical): No    Lack of Transportation (Non-Medical): No  Physical Activity: Inactive (12/01/2023)   Exercise Vital Sign    Days of Exercise per Week: 0 days    Minutes of Exercise per Session: 0 min  Stress: No Stress Concern Present (12/01/2023)   [employer] of Occupational Health - Occupational Stress Questionnaire    Feeling of Stress : Not at all  Social Connections: Socially Integrated (12/01/2023)   Social Connection and Isolation Panel    Frequency of Communication with Friends and Family: More than three times a week    Frequency of Social Gatherings with Friends and Family: Once a week    Attends Religious Services: More than 4 times per year    Active Member of [employer] or Organizations: Yes    Attends Banker Meetings: Never    Marital Status: Married  Catering Manager Violence: Not At Risk (12/01/2023)   Humiliation, Afraid, Rape, and Kick questionnaire    Fear of Current or Ex-Partner: No    Emotionally Abused: No    Physically Abused: No    Sexually Abused: No  Depression (PHQ2-9): Low Risk (09/18/2024)   Depression (PHQ2-9)    PHQ-2 Score: 0  Alcohol Screen: Low Risk (12/01/2023)   Alcohol Screen    Last Alcohol Screening Score (AUDIT): 0  Housing: Unknown (12/01/2023)   Housing Stability Vital Sign    Unable to Pay for Housing in the Last Year: No    Number of Times Moved in the Last Year: Not on file    Homeless in the Last Year: No   Utilities: Not At Risk (12/01/2023)   [facility] Utilities    Threatened with loss of utilities: No  Health Literacy: Adequate Health Literacy (12/01/2023)   B1300 Health Literacy    Frequency of need for help with medical instructions: Never    Outpatient Medications Prior to Visit  Medication  Sig Dispense Refill   amLODipine  (NORVASC ) 10 MG tablet TAKE 1 TABLET BY MOUTH EVERY DAY 90 tablet 1   aspirin  EC 81 MG EC tablet Take 1 tablet (81 mg total) by mouth daily.     atorvastatin  (LIPITOR) 10 MG tablet TAKE 1 TABLET(10 MG) BY MOUTH DAILY 90 tablet 1   Cholecalciferol (VITAMIN D ) 125 MCG (5000 UT) CAPS Take by mouth.     clobetasol  cream (TEMOVATE ) 0.05 % Apply topically 2 (two) times daily. (Patient taking differently: Apply topically as needed.) 30 g 1   esomeprazole  (NEXIUM ) 40 MG capsule TAKE 1 CAPSULE(40 MG) BY MOUTH DAILY 90 capsule 2   lisinopril  (ZESTRIL ) 5 MG tablet TAKE 1 TABLET(5 MG) BY MOUTH DAILY 90 tablet 1   multivitamin-iron-minerals-folic acid (CENTRUM) chewable tablet Chew 1 tablet by mouth daily.     polyethylene glycol powder (GLYCOLAX /MIRALAX ) 17 GM/SCOOP powder Take 17 g by mouth daily. Dissolve 1 capful (17g) in 4-8 ounces of liquid and take by mouth daily. 765 g 1   allopurinol  (ZYLOPRIM ) 100 MG tablet TAKE 1 TABLET(100 MG) BY MOUTH DAILY 90 tablet 1   nitrofurantoin , macrocrystal-monohydrate, (MACROBID ) 100 MG capsule Take 1 capsule (100 mg total) by mouth 2 (two) times daily. 10 capsule 0   potassium chloride  (KLOR-CON  M) 10 MEQ tablet Take 1 tablet (10 mEq total) by mouth daily. 30 tablet 1   No facility-administered medications prior to visit.    Allergies[1]  Review of Systems  Constitutional:  Negative for chills, fever and malaise/fatigue.  Eyes:  Negative for blurred vision and double vision.  Respiratory:  Negative for shortness of breath.   Cardiovascular:  Negative for chest pain, palpitations and leg swelling.  Gastrointestinal:  Negative for abdominal  pain, constipation, diarrhea, nausea and vomiting.  Genitourinary:  Negative for dysuria, frequency and urgency.  Neurological:  Negative for dizziness.       Objective:    Physical Exam Constitutional:      General: She is not in acute distress.    Appearance: She is not ill-appearing.  HENT:     Mouth/Throat:     Mouth: Mucous membranes are moist.     Pharynx: Oropharynx is clear.  Eyes:     Extraocular Movements: Extraocular movements intact.     Conjunctiva/sclera: Conjunctivae normal.     Pupils: Pupils are equal, round, and reactive to light.  Cardiovascular:     Rate and Rhythm: Normal rate and regular rhythm.  Pulmonary:     Effort: Pulmonary effort is normal.     Breath sounds: Normal breath sounds.  Musculoskeletal:        General: Normal range of motion.     Cervical back: Normal range of motion and neck supple.     Right lower leg: No edema.     Left lower leg: No edema.  Skin:    General: Skin is warm and dry.  Neurological:     General: No focal deficit present.     Mental Status: She is alert and oriented to person, place, and time.     Motor: No weakness.     Coordination: Coordination normal.     Gait: Gait normal.  Psychiatric:        Mood and Affect: Mood normal.        Behavior: Behavior normal.        Thought Content: Thought content normal.      BP 112/74   Pulse 80   Temp 98 F (36.7 C) (Temporal)  Ht 5' 5 (1.651 m)   Wt 209 lb (94.8 kg)   LMP 04/26/2008   SpO2 98%   BMI 34.78 kg/m  Wt Readings from Last 3 Encounters:  11/14/24 209 lb (94.8 kg)  09/18/24 205 lb 3.2 oz (93.1 kg)  09/05/24 204 lb (92.5 kg)       Assessment & Plan:   Problem List Items Addressed This Visit     Chronic gout   Relevant Medications   allopurinol  (ZYLOPRIM ) 100 MG tablet   Other Relevant Orders   Uric acid (Completed)   Obesity (BMI 30-39.9)   Relevant Orders   CBC with Differential/Platelet (Completed)   Comprehensive metabolic panel with  GFR (Completed)   TSH (Completed)   Lipid panel (Completed)   Hemoglobin A1c (Completed)   Parkinsonism (HCC)   Primary hypertension   Relevant Orders   CBC with Differential/Platelet (Completed)   Comprehensive metabolic panel with GFR (Completed)   TSH (Completed)   Stage 3 chronic kidney disease (HCC) - Primary   Relevant Orders   Comprehensive metabolic panel with GFR (Completed)   Vitamin D  deficiency   Relevant Orders   VITAMIN D  25 Hydroxy (Vit-D Deficiency, Fractures) (Completed)   Other Visit Diagnoses       History of UTI       Relevant Orders   POCT Urinalysis Dipstick (Automated) (Completed)   Urine Culture     Hematuria, unspecified type         Urinary urgency       Relevant Orders   Hemoglobin A1c (Completed)   Urine Culture     Elevated glucose       Relevant Orders   Comprehensive metabolic panel with GFR (Completed)   Hemoglobin A1c (Completed)     Hypokalemia       Relevant Orders   Comprehensive metabolic panel with GFR (Completed)       Assessment and Plan Assessment & Plan Hematuria No current hematuria. Previous referral to urology for hematuria and smoking history due to concern for bladder cancer. Referral to urology considered due to ongoing urinary urgency. - send urine culture   Chronic kidney disease stage 3 Chronic kidney disease stage 3. Importance of blood pressure control discussed to prevent worsening of CKD. - Continue current antihypertensive regimen with amlodipine  and lisinopril .  Primary hypertension Blood pressure is currently in goal range. No medication adjustment needed. - Continue current antihypertensive regimen with amlodipine  and lisinopril .  Hypokalemia Likely due to low intake of potassium-rich foods. She has been working on increasing dietary potassium intake. - Checked potassium levels today. - Encouraged continued dietary intake of potassium-rich foods. - Will consider resuming potassium supplements if  dietary intake is insufficient.  Vitamin D  deficiency Vitamin D  levels previously high, possibly due to excessive supplementation. She takes two vitamin D  pills daily. - Checked vitamin D  levels today. - Will adjust vitamin D  supplementation as needed based on lab results.  Idiopathic chronic gout No recent gout flares. Current allopurinol  dose may be insufficient as uric acid levels remain high. - Checked uric acid levels today. - Will increase allopurinol  dose to 200 mg daily if uric acid levels remain high.  Urinary urgency Chronic urinary urgency without incontinence. No current hematuria. Previous UTI treated with antibiotics. Urgency may be related to dietary habits or other non-infectious causes. - Provided Kegel exercises to strengthen pelvic floor. - Urine culture sent.  Primary parkinsonism Continues to follow up with neurology for Parkinson's disease management. - Continue follow-up with neurology  as scheduled.  Elevated glucose Elevated glucose levels noted. She has a family history of diabetes and consumes sweets frequently. - Advised on reducing sweets intake and monitoring blood glucose levels. - Check A1c  History of urinary tract infection Previous UTI treated with antibiotics. No current symptoms of UTI. - no hematuria today  - Urine culture sent.     I have discontinued Betzaida K. Depaz's nitrofurantoin  (macrocrystal-monohydrate). I have also changed her allopurinol . Additionally, I am having her maintain her aspirin  EC, multivitamin-iron-minerals-folic acid, Vitamin D , clobetasol  cream, esomeprazole , lisinopril , polyethylene glycol powder, atorvastatin , amLODipine , and potassium chloride .  Meds ordered this encounter  Medications   allopurinol  (ZYLOPRIM ) 100 MG tablet    Sig: Take 2 tablets (200 mg total) by mouth daily.    Dispense:  180 tablet    Refill:  1    Supervising Provider:   ROLLENE NORRIS A [[id number]]   potassium chloride  (KLOR-CON  M) 10  MEQ tablet    Sig: Take 1 tablet (10 mEq total) by mouth daily.    Dispense:  30 tablet    Refill:  5    Supervising Provider:   ROLLENE NORRIS A [[id number]]       [1] No Known Allergies  "

## 2024-11-14 NOTE — Progress Notes (Signed)
 Please see that she reads my message regarding her abnormal labs and new medication doses.

## 2024-11-17 LAB — URINE CULTURE

## 2024-12-13 ENCOUNTER — Ambulatory Visit: Payer: Medicare Other

## 2024-12-17 ENCOUNTER — Ambulatory Visit: Admitting: Family Medicine

## 2025-03-15 ENCOUNTER — Ambulatory Visit: Payer: PRIVATE HEALTH INSURANCE | Admitting: Family Medicine
